# Patient Record
Sex: Female | Born: 1950 | Race: White | Hispanic: No | Smoking: Former smoker
Health system: Southern US, Community
[De-identification: ages and names within clinical notes are randomized; demographics above are authoritative.]

## PROBLEM LIST (undated history)

## (undated) DIAGNOSIS — I493 Ventricular premature depolarization: Secondary | ICD-10-CM

## (undated) DIAGNOSIS — R609 Edema, unspecified: Secondary | ICD-10-CM

## (undated) DIAGNOSIS — I059 Rheumatic mitral valve disease, unspecified: Secondary | ICD-10-CM

## (undated) DIAGNOSIS — I10 Essential (primary) hypertension: Secondary | ICD-10-CM

## (undated) DIAGNOSIS — R002 Palpitations: Secondary | ICD-10-CM

## (undated) DIAGNOSIS — M1990 Unspecified osteoarthritis, unspecified site: Secondary | ICD-10-CM

## (undated) DIAGNOSIS — R079 Chest pain, unspecified: Secondary | ICD-10-CM

## (undated) DIAGNOSIS — E079 Disorder of thyroid, unspecified: Secondary | ICD-10-CM

## (undated) DIAGNOSIS — I701 Atherosclerosis of renal artery: Secondary | ICD-10-CM

## (undated) DIAGNOSIS — I739 Peripheral vascular disease, unspecified: Secondary | ICD-10-CM

## (undated) HISTORY — DX: Rheumatic mitral valve disease, unspecified: I05.9

## (undated) HISTORY — DX: Peripheral vascular disease, unspecified: I73.9

## (undated) HISTORY — DX: Edema, unspecified: R60.9

## (undated) HISTORY — PX: EYE SURGERY: SHX253

## (undated) HISTORY — DX: Ventricular premature depolarization: I49.3

## (undated) HISTORY — DX: Essential (primary) hypertension: I10

## (undated) HISTORY — PX: INNER EAR SURGERY: SHX679

## (undated) HISTORY — DX: Unspecified osteoarthritis, unspecified site: M19.90

## (undated) HISTORY — DX: Palpitations: R00.2

## (undated) HISTORY — DX: Disorder of thyroid, unspecified: E07.9

## (undated) HISTORY — PX: TONSILLECTOMY: SUR1361

## (undated) HISTORY — DX: Chest pain, unspecified: R07.9

## (undated) HISTORY — PX: OTHER SURGICAL HISTORY: SHX169

## (undated) HISTORY — DX: Atherosclerosis of renal artery: I70.1

---

## 1998-10-20 ENCOUNTER — Other Ambulatory Visit: Admission: RE | Admit: 1998-10-20 | Discharge: 1998-10-20 | Payer: Self-pay | Admitting: Gynecology

## 1999-10-22 ENCOUNTER — Other Ambulatory Visit: Admission: RE | Admit: 1999-10-22 | Discharge: 1999-10-22 | Payer: Self-pay | Admitting: Gynecology

## 2000-10-25 ENCOUNTER — Other Ambulatory Visit: Admission: RE | Admit: 2000-10-25 | Discharge: 2000-10-25 | Payer: Self-pay | Admitting: Gynecology

## 2000-11-20 ENCOUNTER — Ambulatory Visit (HOSPITAL_COMMUNITY): Admission: RE | Admit: 2000-11-20 | Discharge: 2000-11-20 | Payer: Self-pay | Admitting: Orthopaedic Surgery

## 2000-11-20 ENCOUNTER — Encounter: Payer: Self-pay | Admitting: Orthopaedic Surgery

## 2001-10-26 ENCOUNTER — Other Ambulatory Visit: Admission: RE | Admit: 2001-10-26 | Discharge: 2001-10-26 | Payer: Self-pay | Admitting: Gynecology

## 2001-12-18 ENCOUNTER — Encounter: Payer: Self-pay | Admitting: Gynecology

## 2001-12-18 ENCOUNTER — Ambulatory Visit (HOSPITAL_COMMUNITY): Admission: RE | Admit: 2001-12-18 | Discharge: 2001-12-18 | Payer: Self-pay | Admitting: Gynecology

## 2001-12-21 ENCOUNTER — Ambulatory Visit (HOSPITAL_BASED_OUTPATIENT_CLINIC_OR_DEPARTMENT_OTHER): Admission: RE | Admit: 2001-12-21 | Discharge: 2001-12-21 | Payer: Self-pay | Admitting: Orthopedic Surgery

## 2002-01-09 ENCOUNTER — Encounter: Payer: Self-pay | Admitting: Gynecology

## 2002-01-09 ENCOUNTER — Ambulatory Visit (HOSPITAL_COMMUNITY): Admission: RE | Admit: 2002-01-09 | Discharge: 2002-01-09 | Payer: Self-pay | Admitting: Gynecology

## 2002-08-13 ENCOUNTER — Ambulatory Visit (HOSPITAL_COMMUNITY): Admission: RE | Admit: 2002-08-13 | Discharge: 2002-08-13 | Payer: Self-pay | Admitting: Gynecology

## 2002-08-13 ENCOUNTER — Encounter: Payer: Self-pay | Admitting: Gynecology

## 2002-11-08 ENCOUNTER — Other Ambulatory Visit: Admission: RE | Admit: 2002-11-08 | Discharge: 2002-11-08 | Payer: Self-pay | Admitting: Gynecology

## 2002-12-24 ENCOUNTER — Encounter: Payer: Self-pay | Admitting: Gynecology

## 2002-12-24 ENCOUNTER — Ambulatory Visit (HOSPITAL_COMMUNITY): Admission: RE | Admit: 2002-12-24 | Discharge: 2002-12-24 | Payer: Self-pay | Admitting: Gynecology

## 2003-05-14 ENCOUNTER — Inpatient Hospital Stay (HOSPITAL_COMMUNITY): Admission: EM | Admit: 2003-05-14 | Discharge: 2003-05-16 | Payer: Self-pay | Admitting: Emergency Medicine

## 2003-06-14 ENCOUNTER — Encounter (INDEPENDENT_AMBULATORY_CARE_PROVIDER_SITE_OTHER): Payer: Self-pay

## 2003-06-14 ENCOUNTER — Ambulatory Visit (HOSPITAL_COMMUNITY): Admission: RE | Admit: 2003-06-14 | Discharge: 2003-06-14 | Payer: Self-pay | Admitting: Gynecology

## 2003-08-23 ENCOUNTER — Ambulatory Visit (HOSPITAL_COMMUNITY): Admission: RE | Admit: 2003-08-23 | Discharge: 2003-08-23 | Payer: Self-pay | Admitting: Internal Medicine

## 2003-09-09 ENCOUNTER — Ambulatory Visit (HOSPITAL_COMMUNITY): Admission: RE | Admit: 2003-09-09 | Discharge: 2003-09-09 | Payer: Self-pay | Admitting: Internal Medicine

## 2003-12-16 ENCOUNTER — Other Ambulatory Visit: Admission: RE | Admit: 2003-12-16 | Discharge: 2003-12-16 | Payer: Self-pay | Admitting: Gynecology

## 2003-12-25 ENCOUNTER — Ambulatory Visit (HOSPITAL_COMMUNITY): Admission: RE | Admit: 2003-12-25 | Discharge: 2003-12-25 | Payer: Self-pay | Admitting: Gynecology

## 2004-08-06 ENCOUNTER — Ambulatory Visit (HOSPITAL_COMMUNITY): Admission: RE | Admit: 2004-08-06 | Discharge: 2004-08-06 | Payer: Self-pay | Admitting: Internal Medicine

## 2004-12-18 ENCOUNTER — Other Ambulatory Visit: Admission: RE | Admit: 2004-12-18 | Discharge: 2004-12-18 | Payer: Self-pay | Admitting: Gynecology

## 2004-12-22 ENCOUNTER — Ambulatory Visit (HOSPITAL_COMMUNITY): Admission: RE | Admit: 2004-12-22 | Discharge: 2004-12-22 | Payer: Self-pay | Admitting: Gynecology

## 2004-12-25 ENCOUNTER — Ambulatory Visit (HOSPITAL_COMMUNITY): Admission: RE | Admit: 2004-12-25 | Discharge: 2004-12-25 | Payer: Self-pay | Admitting: Gynecology

## 2005-12-27 ENCOUNTER — Ambulatory Visit (HOSPITAL_COMMUNITY): Admission: RE | Admit: 2005-12-27 | Discharge: 2005-12-27 | Payer: Self-pay | Admitting: Gynecology

## 2006-01-21 ENCOUNTER — Ambulatory Visit: Payer: Self-pay | Admitting: Internal Medicine

## 2006-01-21 ENCOUNTER — Ambulatory Visit (HOSPITAL_COMMUNITY): Admission: RE | Admit: 2006-01-21 | Discharge: 2006-01-21 | Payer: Self-pay | Admitting: Internal Medicine

## 2006-12-29 ENCOUNTER — Ambulatory Visit (HOSPITAL_COMMUNITY): Admission: RE | Admit: 2006-12-29 | Discharge: 2006-12-29 | Payer: Self-pay | Admitting: Gynecology

## 2007-01-20 ENCOUNTER — Ambulatory Visit (HOSPITAL_COMMUNITY): Admission: RE | Admit: 2007-01-20 | Discharge: 2007-01-20 | Payer: Self-pay | Admitting: *Deleted

## 2007-01-27 HISTORY — PX: CARDIAC CATHETERIZATION: SHX172

## 2007-06-23 ENCOUNTER — Ambulatory Visit (HOSPITAL_COMMUNITY): Admission: RE | Admit: 2007-06-23 | Discharge: 2007-06-23 | Payer: Self-pay | Admitting: Family Medicine

## 2007-06-30 ENCOUNTER — Ambulatory Visit (HOSPITAL_COMMUNITY): Admission: RE | Admit: 2007-06-30 | Discharge: 2007-06-30 | Payer: Self-pay | Admitting: Family Medicine

## 2008-01-02 ENCOUNTER — Ambulatory Visit (HOSPITAL_COMMUNITY): Admission: RE | Admit: 2008-01-02 | Discharge: 2008-01-02 | Payer: Self-pay | Admitting: Gynecology

## 2008-10-14 ENCOUNTER — Ambulatory Visit (HOSPITAL_COMMUNITY): Admission: RE | Admit: 2008-10-14 | Discharge: 2008-10-14 | Payer: Self-pay | Admitting: Internal Medicine

## 2008-10-28 ENCOUNTER — Emergency Department (HOSPITAL_COMMUNITY): Admission: EM | Admit: 2008-10-28 | Discharge: 2008-10-28 | Payer: Self-pay | Admitting: Emergency Medicine

## 2009-01-08 ENCOUNTER — Ambulatory Visit (HOSPITAL_COMMUNITY): Admission: RE | Admit: 2009-01-08 | Discharge: 2009-01-08 | Payer: Self-pay | Admitting: Gynecology

## 2009-01-26 DIAGNOSIS — I059 Rheumatic mitral valve disease, unspecified: Secondary | ICD-10-CM

## 2009-01-26 HISTORY — DX: Rheumatic mitral valve disease, unspecified: I05.9

## 2009-10-14 ENCOUNTER — Ambulatory Visit (HOSPITAL_COMMUNITY): Admission: RE | Admit: 2009-10-14 | Discharge: 2009-10-14 | Payer: Self-pay | Admitting: Family Medicine

## 2009-10-27 ENCOUNTER — Ambulatory Visit (HOSPITAL_COMMUNITY): Admission: RE | Admit: 2009-10-27 | Discharge: 2009-10-27 | Payer: Self-pay | Admitting: Family Medicine

## 2009-12-22 ENCOUNTER — Ambulatory Visit (HOSPITAL_COMMUNITY): Admission: RE | Admit: 2009-12-22 | Discharge: 2009-12-22 | Payer: Self-pay | Admitting: Cardiology

## 2009-12-26 DIAGNOSIS — I739 Peripheral vascular disease, unspecified: Secondary | ICD-10-CM

## 2009-12-26 HISTORY — DX: Peripheral vascular disease, unspecified: I73.9

## 2010-01-05 ENCOUNTER — Ambulatory Visit: Payer: Self-pay | Admitting: Vascular Surgery

## 2010-01-08 ENCOUNTER — Ambulatory Visit: Payer: Self-pay | Admitting: Vascular Surgery

## 2010-01-08 ENCOUNTER — Inpatient Hospital Stay (HOSPITAL_COMMUNITY): Admission: RE | Admit: 2010-01-08 | Discharge: 2010-01-09 | Payer: Self-pay | Admitting: Vascular Surgery

## 2010-01-08 ENCOUNTER — Encounter: Payer: Self-pay | Admitting: Vascular Surgery

## 2010-01-08 HISTORY — PX: CAROTID ENDARTERECTOMY: SUR193

## 2010-01-26 ENCOUNTER — Ambulatory Visit: Payer: Self-pay | Admitting: Vascular Surgery

## 2010-01-29 ENCOUNTER — Encounter: Payer: Self-pay | Admitting: Vascular Surgery

## 2010-02-09 ENCOUNTER — Ambulatory Visit: Payer: Self-pay | Admitting: Vascular Surgery

## 2010-05-18 ENCOUNTER — Ambulatory Visit (HOSPITAL_COMMUNITY): Admission: RE | Admit: 2010-05-18 | Discharge: 2010-05-18 | Payer: Self-pay | Admitting: Internal Medicine

## 2010-07-19 ENCOUNTER — Encounter: Payer: Self-pay | Admitting: Gynecology

## 2010-08-04 ENCOUNTER — Ambulatory Visit (INDEPENDENT_AMBULATORY_CARE_PROVIDER_SITE_OTHER): Payer: BC Managed Care – PPO | Admitting: Vascular Surgery

## 2010-08-04 ENCOUNTER — Other Ambulatory Visit: Payer: Self-pay

## 2010-08-04 DIAGNOSIS — Z48812 Encounter for surgical aftercare following surgery on the circulatory system: Secondary | ICD-10-CM

## 2010-08-04 DIAGNOSIS — I6529 Occlusion and stenosis of unspecified carotid artery: Secondary | ICD-10-CM

## 2010-08-07 NOTE — Assessment & Plan Note (Signed)
OFFICE VISIT  FREDIA, CHITTENDEN DOB:  03/06/1951                                       08/04/2010 EAVWU#:[id number]  The patient is a 60 year old female who returns today for follow-up regarding her right carotid endarterectomy which I performed July 14 of last year.  She was asymptomatic preoperatively and has had no neurologic symptoms including  hemiparesis, aphasia, amaurosis fugax, diplopia, blurred vision or syncope since her surgery.  He is taking one 81-mg aspirin tablet a day.  CHRONIC MEDICAL PROBLEMS: 1. Hypertension. 2. Hyperlipidemia. 3. Hypothyroidism. 4. Gout. 5. Negative for coronary artery disease, diabetes or stroke.  SOCIAL HISTORY:  She is married, has two children.  Works as a Mining engineer.  Does not use tobacco or alcohol.  REVIEW OF SYSTEMS:  Negative for chest pain, dyspnea on exertion.  Does have asthma on a chronic basis and arthritis and has no other specific symptoms in a complete review of systems.  PHYSICAL EXAMINATION:  Blood pressure 135/63, heart rate 81, respirations 18.  General:  She is a well-developed, well-nourished female who is in no apparent distress, alert and oriented x3.  HEENT: Exam normal for age.  EOMs intact.  Lungs:  Clear to auscultation.  No rhonchi or wheezing.  Cardiovascular:  Regular rhythm, no murmurs. Carotid pulses are 3+ and no bruits audible.  Right neck incision is healed nicely.  Abdomen:  Soft, nontender, with no masses. Musculoskeletal exam is free of major deformities.  Neurological: Normal.  Skin:  Free of rashes.  I ordered a carotid duplex exam today, which I have reviewed and interpreted.  She has no evidence of flow reduction in either internal carotid at the present time, right carotid endarterectomy site being widely patent.  I think she is doing quite well.  We will see her in 6 months with a follow-up carotid duplex exam at that time unless she develops any symptoms in  the interim.  If so, she will be in touch with [place] for earlier follow-up.    Quita Skye Hart [place], M.D. Electronically Signed  JDL/MEDQ  D:  08/04/2010  T:  08/05/2010  Job:  [id number]  cc:   Madelin Rear. Sherwood Gambler, MD

## 2010-08-10 NOTE — Procedures (Unsigned)
CAROTID DUPLEX EXAM  INDICATION:  Carotid artery disease  HISTORY: Diabetes:  no Cardiac:  no Hypertension:  yes Smoking:  no Previous Surgery:  Right carotid endarterectomy 01/08/2010 CV History:  The patient is currently asymptomatic Amaurosis Fugax No, Paresthesias No, Hemiparesis No                                      RIGHT             LEFT Brachial systolic pressure:         130               120 Brachial Doppler waveforms:         WNL               WNL Vertebral direction of flow:        Antegrade         Antegrade DUPLEX VELOCITIES (cm/sec) CCA peak systolic                   92                80 ECA peak systolic                   181               77 ICA peak systolic                   99                101 ICA end diastolic                   39                35 PLAQUE MORPHOLOGY:                                    Heterogenous PLAQUE AMOUNT:                                        Moderate PLAQUE LOCATION:                                      ICA / ECA / CCA  IMPRESSION:  Patent right carotid endarterectomy.  Bilaterally no hemodynamically significant stenosis within the internal carotid arteries.  Right external carotid artery stenosis.  This could be secondary to vessel tortuosity.  Bilateral tortuous internal carotid arteries.    ___________________________________________ Quita Skye Hart [place], M.D.  OD/MEDQ  D:  08/04/2010  T:  08/04/2010  Job:  [id number]

## 2010-09-12 LAB — CBC
HCT: 32 % — ABNORMAL LOW (ref 36.0–46.0)
Platelets: 163 10*3/uL (ref 150–400)
RDW: 15 % (ref 11.5–15.5)
WBC: 8.6 10*3/uL (ref 4.0–10.5)

## 2010-09-12 LAB — BASIC METABOLIC PANEL
BUN: 5 mg/dL — ABNORMAL LOW (ref 6–23)
CO2: 24 mEq/L (ref 19–32)
Calcium: 8.5 mg/dL (ref 8.4–10.5)
Chloride: 106 mEq/L (ref 96–112)
Creatinine, Ser: 1.11 mg/dL (ref 0.4–1.2)
GFR calc Af Amer: >60 mL/min (ref >60)
GFR calc non Af Amer: 50 mL/min — ABNORMAL LOW (ref >60)
Glucose, Bld: 125 mg/dL — ABNORMAL HIGH (ref 70–99)
Potassium: 3.7 mEq/L (ref 3.5–5.1)
Sodium: 136 mEq/L (ref 135–145)

## 2010-09-13 LAB — TYPE AND SCREEN: Antibody Screen: NEGATIVE

## 2010-09-13 LAB — URINALYSIS, ROUTINE W REFLEX MICROSCOPIC
Hgb urine dipstick: NEGATIVE
Protein, ur: NEGATIVE mg/dL
Urobilinogen, UA: 0.2 mg/dL (ref 0.0–1.0)

## 2010-09-13 LAB — COMPREHENSIVE METABOLIC PANEL
AST: 21 U/L (ref 0–37)
Albumin: 4 g/dL (ref 3.5–5.2)
Calcium: 9.5 mg/dL (ref 8.4–10.5)
Creatinine, Ser: 1.15 mg/dL (ref 0.4–1.2)
GFR calc Af Amer: 58 mL/min — ABNORMAL LOW (ref >60)
GFR calc non Af Amer: 48 mL/min — ABNORMAL LOW (ref >60)

## 2010-09-13 LAB — CBC
MCH: 31.2 pg (ref 26.0–34.0)
MCHC: 32.8 g/dL (ref 30.0–36.0)
Platelets: 210 10*3/uL (ref 150–400)

## 2010-09-13 LAB — APTT: aPTT: 25 seconds (ref 24–37)

## 2010-09-13 LAB — SURGICAL PCR SCREEN: Staphylococcus aureus: NEGATIVE

## 2010-10-06 LAB — CBC
HCT: 38.3 % (ref 36.0–46.0)
Hemoglobin: 12.9 g/dL (ref 12.0–15.0)
MCHC: 33.7 g/dL (ref 30.0–36.0)
MCV: 92.9 fL (ref 78.0–100.0)
RDW: 15 % (ref 11.5–15.5)

## 2010-10-06 LAB — DIFFERENTIAL
Basophils Absolute: 0.1 10*3/uL (ref 0.0–0.1)
Basophils Relative: 1 % (ref 0–1)
Eosinophils Absolute: 0.2 10*3/uL (ref 0.0–0.7)
Eosinophils Relative: 2 % (ref 0–5)
Monocytes Absolute: 0.4 10*3/uL (ref 0.1–1.0)
Monocytes Relative: 5 % (ref 3–12)

## 2010-10-06 LAB — BASIC METABOLIC PANEL
CO2: 26 mEq/L (ref 19–32)
Calcium: 9.5 mg/dL (ref 8.4–10.5)
Chloride: 103 mEq/L (ref 96–112)
Glucose, Bld: 131 mg/dL — ABNORMAL HIGH (ref 70–99)
Potassium: 3.9 mEq/L (ref 3.5–5.1)
Sodium: 136 mEq/L (ref 135–145)

## 2010-10-06 LAB — URINALYSIS, ROUTINE W REFLEX MICROSCOPIC
Bilirubin Urine: NEGATIVE
Glucose, UA: NEGATIVE mg/dL
Ketones, ur: NEGATIVE mg/dL
Protein, ur: NEGATIVE mg/dL

## 2010-10-28 ENCOUNTER — Other Ambulatory Visit (HOSPITAL_COMMUNITY): Payer: Self-pay | Admitting: Gynecology

## 2010-10-28 DIAGNOSIS — Z139 Encounter for screening, unspecified: Secondary | ICD-10-CM

## 2010-11-03 ENCOUNTER — Ambulatory Visit (HOSPITAL_COMMUNITY)
Admission: RE | Admit: 2010-11-03 | Discharge: 2010-11-03 | Disposition: A | Discharge status: Home / Self Care | Payer: BC Managed Care – PPO | Source: Ambulatory Visit | Attending: Gynecology | Admitting: Gynecology

## 2010-11-03 DIAGNOSIS — Z1231 Encounter for screening mammogram for malignant neoplasm of breast: Secondary | ICD-10-CM | POA: Insufficient documentation

## 2010-11-03 DIAGNOSIS — Z139 Encounter for screening, unspecified: Secondary | ICD-10-CM

## 2010-11-10 NOTE — H&P (Signed)
HISTORY AND PHYSICAL EXAMINATION   January 05, 2010   Re:  [place], [place] C               DOB:  [birth year]   CHIEF COMPLAINT:  Severe right internal carotid stenosis--asymptomatic.   HISTORY OF PRESENT ILLNESS:  This 60 year old female was referred for  evaluation of carotid arteries because of abnormal carotid duplex and CT  angiogram.  The patient was found to have a right carotid bruit and a  carotid duplex exam at [facility] on December 08, 2009,  was performed which I have reviewed.  This revealed a 75% to 80% right  internal carotid stenosis and a mild left internal carotid stenosis.  The patient denies any hemiparesis, aphasia, amaurosis fugax, diplopia,  or other stroke-like symptoms.  She did have an episode where her visual  field disappeared in both eyes recently, lasting only a few seconds and  then resolving.  She has no history of stroke.  CT angiogram was then  ordered and was performed on June 27 which I have reviewed.  This  reveals a severe stenosis of the origin of the right internal carotid  artery.   CHRONIC MEDICAL PROBLEMS:  1. Hypertension.  2. Hyperlipidemia.  3. Mild renal insufficiency apparently secondary to Naprosyn.  4. Negative for COPD, coronary artery disease, or diabetes.   SOCIAL HISTORY:  She is married, has 2 children, works as a Engineer, maintenance.  She smoked occasional tobacco (cigarettes)  over the last several years but quit 10 years ago.  Never smoked a  significant amount per day.  No alcohol use.   FAMILY HISTORY:  Negative for coronary artery disease, diabetes, or  stroke.   REVIEW OF SYSTEMS:  Does have occasional arthritis pain as well as her  chronic kidney disease.  Denies any chest pain, dyspnea on exertion,  PND, orthopnea.  No GI symptoms.  Denies any bleeding problems, weight  loss, anorexia.  All other systems and review of systems are negative.   PHYSICAL EXAM:  Blood  pressure 110/68, heart rate 70, respirations 14.  General: She is a healthy-appearing female in no apparent distress,  alert and oriented x3.  HEENT exam is normal.  EOMs intact.  Lungs are  clear to auscultation, no rhonchi or wheezing.  Cardiovascular exam  reveals a soft bruit on the right side; 3+ carotid pulses are palpable.  She has a regular rate and rhythm.  No murmurs are heard.  Abdomen:  Soft, nontender with no masses.  She has 3+ femoral, 2+ popliteal, 2+  dorsalis pedis pulses bilaterally.  Musculoskeletal exam is free of  major deformities.  Neurologic exam is normal.  Skin is free of rashes.   IMPRESSION:  1. 80% right internal carotid stenosis--asymptomatic.  2. Hypertension.  3. Hyperlipidemia.  4. Chronic renal insufficiency, mild.   PLAN:  Right carotid endarterectomy.  The risks have been thoroughly  discussed with the patient and her sister.  She would like to proceed.  This is scheduled for Thursday, July 14th.     Quita Skye Hart Rochester, M.D.  Electronically Signed   JDL/MEDQ  D:  01/05/2010  T:  01/06/2010  Job:  [id number]   cc:   Sheliah Mends, MD  Madelin Rear. Sherwood Gambler, MD  Dr. Renette Butters

## 2010-11-10 NOTE — H&P (Signed)
HISTORY AND PHYSICAL EXAMINATION   January 05, 2010   Re:  Spohr, Cathaleen C               DOB:  08/13/1950   CHIEF COMPLAINT:  Severe right internal carotid stenosis--asymptomatic.   HISTORY OF PRESENT ILLNESS:  This 60-year-old female was referred for  evaluation of carotid arteries because of abnormal carotid duplex and CT  angiogram.  The patient was found to have a right carotid bruit and a  carotid duplex exam at [facility] on December 08, 2009,  was performed which I have reviewed.  This revealed a 75% to 80% right  internal carotid stenosis and a mild left internal carotid stenosis.  The patient denies any hemiparesis, aphasia, amaurosis fugax, diplopia,  or other stroke-like symptoms.  She did have an episode where her visual  field disappeared in both eyes recently, lasting only a few seconds and  then resolving.  She has no history of stroke.  CT angiogram was then  ordered and was performed on June 27 which I have reviewed.  This  reveals a severe stenosis of the origin of the right internal carotid  artery.   CHRONIC MEDICAL PROBLEMS:  1. Hypertension.  2. Hyperlipidemia.  3. Mild renal insufficiency apparently secondary to Naprosyn.  4. Negative for COPD, coronary artery disease, or diabetes.   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  She smoked occasional tobacco (cigarettes)  over the last several years but quit 10 years ago.  Never smoked a  significant amount per day.  No alcohol use.   FAMILY HISTORY:  Negative for coronary artery disease, diabetes, or  stroke.   REVIEW OF SYSTEMS:  Does have occasional arthritis pain as well as her  chronic kidney disease.  Denies any chest pain, dyspnea on exertion,  PND, orthopnea.  No GI symptoms.  Denies any bleeding problems, weight  loss, anorexia.  All other systems and review of systems are negative.   PHYSICAL EXAM:  Blood  pressure 110/68, heart rate 70, respirations 14.  General: She is a healthy-appearing female in no apparent distress,  alert and oriented x3.  HEENT exam is normal.  EOMs intact.  Lungs are  clear to auscultation, no rhonchi or wheezing.  Cardiovascular exam  reveals a soft bruit on the right side; 3+ carotid pulses are palpable.  She has a regular rate and rhythm.  No murmurs are heard.  Abdomen:  Soft, nontender with no masses.  She has 3+ femoral, 2+ popliteal, 2+  dorsalis pedis pulses bilaterally.  Musculoskeletal exam is free of  major deformities.  Neurologic exam is normal.  Skin is free of rashes.   IMPRESSION:  1. 80% right internal carotid stenosis--asymptomatic.  2. Hypertension.  3. Hyperlipidemia.  4. Chronic renal insufficiency, mild.   PLAN:  Right carotid endarterectomy.  The risks have been thoroughly  discussed with the patient and her sister.  She would like to proceed.  This is scheduled for Thursday, July 14th.     James D. Lawson, M.D.  Electronically Signed   JDL/MEDQ  D:  01/05/2010  T:  01/06/2010  Job:  [id number]   cc:   Jens Eichhorn, MD  Lawrence J. Fusco, MD  Dr. Golden 

## 2010-11-10 NOTE — Assessment & Plan Note (Signed)
OFFICE VISIT   Schnepp, Jaelen C  DOB:  [birth year]                                       01/26/2010  JXBJY#:[id number]   Patient returns 3 weeks post right carotid endarterectomy for severe  asymptomatic right internal carotid stenosis.  She had minimal left  internal carotid occlusive disease.  She has done very well since her  surgery with no complaints of hoarseness or dysphasia and has had no  hemiparesis, aphasia, amaurosis fugax, diplopia, blurred vision, or  syncope.  She is swallowing well and has returned to her normal  activities.  She is taking an aspirin per day.   PHYSICAL EXAMINATION:  Blood pressure is 111/68, heart rate 70,  respirations 14.  Right neck incision is healed nicely.  She has 3+  carotid pulses.  No audible bruits.  Neurologic exam is normal.   I have reassured regarding these findings and encouraged her to resume  her activities as tolerated.  She will return to see [place] in 6 months for  a follow-up carotid duplex exam at that time unless she develops any  neurologic symptoms in the interim.     Quita Skye Hart Rochester, M.D.  Electronically Signed   JDL/MEDQ  D:  01/26/2010  T:  01/27/2010  Job:  [id number]

## 2010-11-13 NOTE — Op Note (Signed)
Jasmine Black, Jasmine Black                         ACCOUNT NO.:  [id number]   MEDICAL RECORD NO.:  [id number]                   PATIENT TYPE:  AMB   LOCATION:  SDC                                  FACILITY:  [facility]   PHYSICIAN:  Luvenia Redden, M.D.                DATE OF BIRTH:  [birth year]   DATE OF PROCEDURE:  06/14/2003  DATE OF DISCHARGE:                                 OPERATIVE REPORT   PREOPERATIVE DIAGNOSIS:  Abnormally thickened endometrial stripe on  ultrasound.   POSTOPERATIVE DIAGNOSIS:  Large endometrial polyp.   OPERATION:  Hysteroscopy and dilatation and curettage and polypectomy.   SURGEON:  Luvenia Redden, M.D.   DESCRIPTION OF PROCEDURE:  Under good sedation the patient was prepped and  draped in a sterile manner.  The cervix was grasped with a tenaculum and a  paracervical block was performed using 1% lidocaine, 10 mL on either side of  the cervix at the 4 and 8 o'clock positions approximately.  The uterus was  then sounded to a depth of 3-1/2 inches.  The cervix was dilated.  The scope  was inserted into the endocervical canal.  Using sorbitol as a distending  medium, the scope was advanced.  The endocervical canal appeared normal.  Immediately upon entering the uterine cavity, a large mass was encountered.  It was smooth on the surface, had several dilated vessels.  It was free in  the uterine cavity except on the anterior wall, where its pedicle seemed to  be attached.  The remainder of the endometrial surface appeared to be  actually kind of atrophic.  There were no other abnormalities found.  The  scope was retracted out of the uterus.  An endocervical curettage was done,  and this was sent as a separate specimen.  The endometrial cavity was then  explored with the polyp forceps and then the polyp was removed pretty much  in its entirety.  On examination it had a pearly pink color to it and was  smooth on its surface.  Continued exploration of the uterine cavity  failed  to obtain any further material.  The endometrial cavity was then scraped  thoroughly with a sharp curette, and minimal tissue was obtained.  The  endometrial cavity was then wiped and was clean.  The patient was given some  Toradol IV to prevent cramping postop.  There was minimal fluid deficit.  Blood loss was less than 20 mL.  The patient tolerated the procedure well  and was removed to recovery in good condition.                                               Luvenia Redden, M.D.    WSB/MEDQ  D:  06/14/2003  T:  06/14/2003  Job:  [id number]

## 2010-11-13 NOTE — Discharge Summary (Signed)
Jasmine Black, Jasmine Black                         ACCOUNT NO.:  [id number]   MEDICAL RECORD NO.:  [id number]                   PATIENT TYPE:  INP   LOCATION:  A203                                 FACILITY:  [facility]   PHYSICIAN:  Madelin Rear. Sherwood Gambler, M.D.             DATE OF BIRTH:  September 28, 1950   DATE OF ADMISSION:  05/14/2003  DATE OF DISCHARGE:  05/16/2003                                 DISCHARGE SUMMARY   DISCHARGE DIAGNOSES:  1. Pulmonary nodules, question significance.  2. Pelvic mass, question significance.   DISCHARGE MEDICATIONS:  1. HCTZ 12.5 mg p.o. daily.  2. Zyrtec 10 mg p.o. daily p.r.n.  3. Discontinuation of baseline Naprosyn usage and changed to Naprosyn 500 mg     p.o. b.i.d. p.r.n. gout.   DISCHARGE PENDING STUDIES:  1. CT of abdomen and pelvis.  2. Follow up CT of chest.   SUMMARY:  The patient had difficulty with shortness of breath, positional  vertigo and near syncopal episode.  She felt fatigued.  There was no other  localizing signs or symptoms.  She had no chest pain, hematemesis,  hematochezia or melena.  She had a notable past history of hypertension,  well managed as an outpatient.  She had nonspecific ST-T wave changes in her  EKG.  D. dimer was elevated.  This prompted a CT of the chest to exclude a  pulmonary embolus that is contributory to her shortness of breath and near  syncope, although, negative for embolisms.  Old pulmonary nodules were noted  of unknown significance.  During the scan of her legs for a DVT, an  incidental pelvic mass was noted to be followed by GYN as well as evaluation  with formal CT of the abdomen and pelvis.  She underwent CT of the head  which was negative.  Her cardiopulmonary status remained quite stable, and  she developed no other signs or symptoms and in fact improved  symptomatically.  A follow up pelvic ultrasound with a questionable adnexal  mass noted in the CT scan for DVT revealed thickening of the endometrial  stripe with no adnexal mass.  This was considered possibly an early  endometrial carcinoma, and therefore, GYN will be seen in follow up for  definitive evaluation and biopsy.   CONDITION ON DISCHARGE:  Stable.   FOLLOWUP:  She will follow up in the office as well as with GYN as described  above.     ___________________________________________                                         Madelin Rear. Sherwood Gambler, M.D.   LJF/MEDQ  D:  05/28/2003  T:  05/28/2003  Job:  [id number]

## 2010-11-13 NOTE — H&P (Signed)
[name], Jasmine Black                         ACCOUNT NO.:  [id number]   MEDICAL RECORD NO.:  [id number]                   PATIENT TYPE:  EMS   LOCATION:  ED                                   FACILITY:  [facility]   PHYSICIAN:  Madelin Rear. Sherwood Gambler, M.D.             DATE OF BIRTH:  12/13/1950   DATE OF ADMISSION:  05/14/2003  DATE OF DISCHARGE:                                HISTORY & PHYSICAL   CHIEF COMPLAINT:  Shortness of breath and near syncope.   HISTORY OF PRESENT ILLNESS:  The patient has had repeated bouts of somewhat  positional induced dizziness, poorly described and poorly characterized.  She does admit to some blacking out sensation, visual changes and  orthostatic change in posture.  She denies any hematemesis, hematochezia or  melena.  Possibly relevant is a recent change in her blood pressure  medication adding an ACE inhibitor to a baseline of HCTZ usage for better  control.  She admitted to a slight headache as well as requiring deep  breaths occasionally to get enough air.  Denies any hemoptysis, cough,  sputum, fever, rigors or chills.  There has been no vomiting, however,  positive nausea with a vaguely described vertiginous sensation.  She denied  any chest pain or palpitations.  No hematemesis, hematochezia, or melena.   PAST MEDICAL HISTORY:  Hypertension.   SOCIAL HISTORY:  Married, lives with husband, nonsmoker, nondrinker.   FAMILY HISTORY:  Noncontributory for this illness.   REVIEW OF SYSTEMS:  Under HPI all else negative.   PHYSICAL EXAMINATION:  GENERAL:  She appears somewhat anxious, moving slowly  when sitting up.  She has no evident nystagmus.  HEAD/NECK:  Showed no JVD or adenopathy.  Neck is supple.  CHEST:  Clear.  CARDIAC:     Regular rhythm without murmur, gallop or rub.  ABDOMEN:  Soft.  No organomegaly or masses.  EXTREMITIES:  Without clubbing, cyanosis or edema.  NEUROLOGIC:  Nonfocal.   LABORATORY STUDIES:  Obtained including hemoccult  stool which was negative.   EKG, in office as well as repeat in the emergency department, was remarkable  for nonspecific ST-T wave changes, questionably consistent with hypokalemia.  There was no acute ischemia or infarction noted.   A D-dimmer was noted to be elevated.  This was followed by a CT of her chest  which showed no pulmonary embolus or DVT.  Incidentally noted were scattered  tiny nodular densities with recommended repeat CT per radiologist.  Incidentally noted was a cystic adnexal mass and an ultrasound was suggested  for followup.  CT of her head was obtained and results are pending for  review, will be reviewed when available.   White count was elevated at 8.2 but H&H was fine.  Electrolytes were within  normal limits.  Cardiac enzymes revealed a modest elevation of her troponin  at 0.05; however, CK and MB were negative.   IMPRESSION:  1. Possible vertigo and near syncope.  Admit telemetry monitoring, serial     cardiac enzymes.  2. Elevated troponin, as above.  Possible cardiology consultation in the     morning.  3. Hypertension, expect in observation.  Continue diuretic therapy with     supplemental potassium, monitor for changes.  4. Elevated white count, uncertain significance at present.  Monitor for     fever spike and check urinalysis.   DISPOSITION:  The patient is currently stable in the emergency department,  pending review of CT of her head.  Addendum to be dictated as needed.     ___________________________________________                                         Madelin Rear. Sherwood Gambler, M.D.   LJF/MEDQ  D:  05/14/2003  T:  05/14/2003  Job:  [id number]

## 2010-11-13 NOTE — Op Note (Signed)
NAMEVELVET, [name]               ACCOUNT NO.:  [id number]   MEDICAL RECORD NO.:  [id number]          PATIENT TYPE:  AMB   LOCATION:  DAY                           FACILITY:  [facility]   PHYSICIAN:  R. Roetta Sessions, M.D. DATE OF BIRTH:  1950-08-28   DATE OF PROCEDURE:  01/21/2006  DATE OF DISCHARGE:                                 OPERATIVE REPORT   PROCEDURE:  Screening colonoscopy.   INDICATIONS FOR PROCEDURE:  The patient is a 60 year old lady sent over at  the courtesy of Dr. Artis Delay for colorectal cancer screening.  She has no  lower gastrointestinal tract symptoms.  There is no family history of  colorectal neoplasia.  She has never had lower GI tract imaged and  colonoscopy is now being done.  This approach has been discussed with the  patient at length.  Potential risks, benefits and alternatives have been  reviewed.  Questions answered.  She is agreeable.  Please see documentation  in patient's medical record.   PROCEDURE NOTE:  O2 saturation, blood pressure, pulse and respirations were  monitored throughout the entire procedure.   CONSCIOUS SEDATION:  Versed 4 mg, IV Demerol 100 mg IV in divided doses.   INSTRUMENT:  Olympus video chip system.   FINDINGS:  Digital rectal exam revealed no abnormalities.   ENDOSCOPIC FINDINGS:  The prep was excellent.   Rectum:  Examination of the rectal mucosa on retroflexed view at the anal  verge revealed no abnormalities aside from internal hemorrhoids.   The colonic mucosa was surveyed from the rectosigmoid junction through the  left, transverse, right colon, appendiceal orifice, ileocecal valve and  cecum.  These structures were well seen and photographed for the record.  The Olympus video scope was withdrawn.  All previously mucosal surfaces were  again seen.  The patient had extensive left-sided diverticula.  The  remaining colonic mucosa appeared normal.  The patient tolerated the  procedure well.   ENDOSCOPIC  IMPRESSION:  1.  Internal hemorrhoids, otherwise normal rectum.  2.  Left-sided diverticulum, rest of colonic mucosa appeared normal.   RECOMMENDATIONS:  1.  Diverticulosis literature provided to [name].  2.  Recommend repeat screening colonoscopy in 10 years.      Jonathon Bellows, M.D.  Electronically Signed     RMR/MEDQ  D:  01/21/2006  T:  01/21/2006  Job:  [id number]

## 2010-11-13 NOTE — Op Note (Signed)
[place]. [facility]  Patient:    BLAIR, MESINA Visit Number: [id number] MRN: [id number]          Service Type: DSU Location: [facility] Attending Physician:  Cornell Barman Dictated by:   Lenard Galloway Chaney Malling, M.D. Proc. Date: 12/21/01 Admit Date:  12/21/2001                             Operative Report  PREOPERATIVE DIAGNOSIS:  Tear of medial meniscus, left knee.  POSTOPERATIVE DIAGNOSES: 1. Tear of posterior horn medial meniscus, left knee. 2. Grade 2 cartilage damage, medial femoral condyle, and grade 4 cartilage    damage in the lateral patellar facet, left knee.  PROCEDURES: 1. Arthroscopy. 2. Debridement of posterior horn, medial meniscus, left knee. 3. Chondroplasty of medial femoral condyle and chondroplasty of lateral    patellar facet.  SURGEON:  Lenard Galloway. Chaney Malling, M.D.  ANESTHESIA:  MAC.  PATHOLOGY:  With the arthroscope in the knee, a very careful examination of both compartments was undertaken.  The patellofemoral joint was visualized first.  The cartilage in the femoral notch was normal, but there is marked fraying of the articular cartilage about the lateral patellar facet.  There was an area of raw bone exposed.  The arthroscope was then passed into the medial compartment.  There was some grade 2 cartilage scuffing over the medial femoral condyle, and there was a tear of the posterior horn of the medial meniscus.  The cartilage over the medial tibial plateau was absolutely normal. The anterior cruciate ligament was normal, and the arthroscope was then passed into the lateral compartment.  The articular cartilage of the lateral femoral condyle, lateral tibial plateau, and the entire circumference of the lateral meniscus was normal.  DESCRIPTION OF PROCEDURE:  Patient placed on the operating table in the supine position with a pneumatic tourniquet about the left upper thigh.  The left leg was placed in the leg holder and  the entire left lower extremity was prepped with Duraprep and draped out in the usual manner.  Marcaine then placed in the knee and Xylocaine and epinephrine used to infiltrate the puncture wounds.  An infusion cannula was placed in the superior medial pouch and the knee distended with saline.  Anterior medial and anterior lateral portals were made and the findings as described.  Attention was first turned to the patella. There was a large area of significant cartilage damage about the lateral patellar facet, and this was debrided.  There was a small area of total loss of articular cartilage.  The arthroscope was then passed into the medial compartment.  There was a tear of the posterior horn of the medial meniscus as noted above.  Through both portals a series of baskets were inserted, and the posterior horn was debrided.  The intra-articular shaver was then reduced and all debris was removed.  The remainder was then smoothed and balanced with a nice transition to the mid-third of the medial meniscus.  Using the smooth chondroplastic shaver, the medial femoral condyle was also debrided.  No other significant pathology was seen.  The knee was then filled with Marcaine and a large bulky pressure dressing applied.  The patient returned to the recovery room in excellent condition.  Technically this procedure went extremely well.  FOLLOW-UP CARE:  To my office in a week. Dictated by:   Lenard Galloway Chaney Malling, M.D. Attending Physician:  Cornell Barman DD:  12/21/01  TD:  12/22/01 Job: [id number]

## 2011-02-02 ENCOUNTER — Other Ambulatory Visit: Payer: BC Managed Care – PPO

## 2011-02-04 ENCOUNTER — Other Ambulatory Visit: Payer: Self-pay | Admitting: Gynecology

## 2011-03-12 ENCOUNTER — Other Ambulatory Visit (INDEPENDENT_AMBULATORY_CARE_PROVIDER_SITE_OTHER): Payer: BC Managed Care – PPO

## 2011-03-12 DIAGNOSIS — I6529 Occlusion and stenosis of unspecified carotid artery: Secondary | ICD-10-CM

## 2011-03-12 DIAGNOSIS — Z48812 Encounter for surgical aftercare following surgery on the circulatory system: Secondary | ICD-10-CM

## 2011-03-17 ENCOUNTER — Encounter: Payer: Self-pay | Admitting: Vascular Surgery

## 2011-03-17 NOTE — Procedures (Unsigned)
CAROTID DUPLEX EXAM  INDICATION:  Carotid artery stenosis.  HISTORY: Diabetes:  No. Cardiac:  No. Hypertension:  Yes. Smoking:  No. Previous Surgery:  Right carotid endarterectomy on 01/08/2010. CV History: Amaurosis Fugax No, Paresthesias No, Hemiparesis No                                      RIGHT             LEFT Brachial systolic pressure:         122               118 Brachial Doppler waveforms:         Triphasic         Triphasic Vertebral direction of flow:        Antegrade         Antegrade DUPLEX VELOCITIES (cm/sec) CCA peak systolic                   86                79 ECA peak systolic                   170               76 ICA peak systolic                   85                51 ICA end diastolic                   30                19 PLAQUE MORPHOLOGY:                                    Complex PLAQUE AMOUNT:                                        Mild PLAQUE LOCATION:                                      Bifurcation, proximal ICA  IMPRESSION: 1. The right internal carotid artery is within normal limits status     post carotid endarterectomy. 2. 20%-39% left internal carotid artery stenosis. 3. Elevated velocities in the right external carotid artery may be     related to small caliber of the vessel.  ___________________________________________ Quita Skye. Hart Rochester, M.D.  CI/MEDQ  D:  03/12/2011  T:  03/12/2011  Job:  [id number]

## 2011-03-18 ENCOUNTER — Other Ambulatory Visit: Payer: Self-pay | Admitting: Vascular Surgery

## 2011-03-18 DIAGNOSIS — Z48812 Encounter for surgical aftercare following surgery on the circulatory system: Secondary | ICD-10-CM

## 2011-03-18 DIAGNOSIS — I6529 Occlusion and stenosis of unspecified carotid artery: Secondary | ICD-10-CM

## 2011-12-09 ENCOUNTER — Other Ambulatory Visit (HOSPITAL_COMMUNITY): Payer: Self-pay | Admitting: Gynecology

## 2011-12-09 DIAGNOSIS — Z139 Encounter for screening, unspecified: Secondary | ICD-10-CM

## 2011-12-13 ENCOUNTER — Ambulatory Visit (HOSPITAL_COMMUNITY): Payer: BC Managed Care – PPO

## 2011-12-16 ENCOUNTER — Ambulatory Visit (HOSPITAL_COMMUNITY)
Admission: RE | Admit: 2011-12-16 | Discharge: 2011-12-16 | Disposition: A | Discharge status: Home / Self Care | Payer: BC Managed Care – PPO | Source: Ambulatory Visit | Attending: Gynecology | Admitting: Gynecology

## 2011-12-16 DIAGNOSIS — Z139 Encounter for screening, unspecified: Secondary | ICD-10-CM

## 2011-12-16 DIAGNOSIS — Z1231 Encounter for screening mammogram for malignant neoplasm of breast: Secondary | ICD-10-CM | POA: Insufficient documentation

## 2012-01-07 DIAGNOSIS — I10 Essential (primary) hypertension: Secondary | ICD-10-CM

## 2012-01-07 HISTORY — DX: Essential (primary) hypertension: I10

## 2012-03-21 ENCOUNTER — Other Ambulatory Visit: Payer: BC Managed Care – PPO

## 2012-03-21 ENCOUNTER — Ambulatory Visit: Payer: BC Managed Care – PPO | Admitting: Neurosurgery

## 2012-04-06 ENCOUNTER — Encounter: Payer: Self-pay | Admitting: Neurosurgery

## 2012-04-07 ENCOUNTER — Ambulatory Visit: Payer: BC Managed Care – PPO | Admitting: Neurosurgery

## 2012-04-07 ENCOUNTER — Other Ambulatory Visit: Payer: BC Managed Care – PPO

## 2012-04-28 ENCOUNTER — Ambulatory Visit (INDEPENDENT_AMBULATORY_CARE_PROVIDER_SITE_OTHER): Payer: BC Managed Care – PPO | Admitting: Neurosurgery

## 2012-04-28 ENCOUNTER — Encounter: Payer: Self-pay | Admitting: Neurosurgery

## 2012-04-28 ENCOUNTER — Other Ambulatory Visit (INDEPENDENT_AMBULATORY_CARE_PROVIDER_SITE_OTHER): Payer: BC Managed Care – PPO | Admitting: *Deleted

## 2012-04-28 VITALS — BP 127/68 | HR 72 | Resp 16 | Ht 62.0 in | Wt 153.0 lb

## 2012-04-28 DIAGNOSIS — I6529 Occlusion and stenosis of unspecified carotid artery: Secondary | ICD-10-CM

## 2012-04-28 NOTE — Progress Notes (Signed)
[facility] Carotid Office Note  CC: Carotid surveillance Referring Physician: Hart Rochester  History of Present Illness: 61 year old female patient of Dr. Hart Rochester status post right CEA in 2011. The patient denies any signs or symptoms of CVA, TIA, amaurosis fugax or any neural deficit. The patient denies any new medical diagnoses or recent surgery.  Past Medical History  Diagnosis Date  . Arthritis     Osteoprosis    ROS: [x]  Positive   [ ]  Denies    General: [ ]  Weight loss, [ ]  Fever, [ ]  chills Neurologic: [ ]  Dizziness, [ ]  Blackouts, [ ]  Seizure [ ]  Stroke, [ ]  "Mini stroke", [ ]  Slurred speech, [ ]  Temporary blindness; [ ]  weakness in arms or legs, [ ]  Hoarseness Cardiac: [ ]  Chest pain/pressure, [ ]  Shortness of breath at rest [ ]  Shortness of breath with exertion, [ ]  Atrial fibrillation or irregular heartbeat Vascular: [ ]  Pain in legs with walking, [ ]  Pain in legs at rest, [ ]  Pain in legs at night,  [ ]  Non-healing ulcer, [ ]  Blood clot in vein/DVT,   Pulmonary: [ ]  Home oxygen, [ ]  Productive cough, [ ]  Coughing up blood, [ ]  Asthma,  [ ]  Wheezing Musculoskeletal:  [ ]  Arthritis, [ ]  Low back pain, [ ]  Joint pain Hematologic: [ ]  Easy Bruising, [ ]  Anemia; [ ]  Hepatitis Gastrointestinal: [ ]  Blood in stool, [ ]  Gastroesophageal Reflux/heartburn, [ ]  Trouble swallowing Urinary: [ ]  chronic Kidney disease, [ ]  on HD - [ ]  MWF or [ ]  TTHS, [ ]  Burning with urination, [ ]  Difficulty urinating Skin: [ ]  Rashes, [ ]  Wounds Psychological: [ ]  Anxiety, [ ]  Depression   Social History History  Substance Use Topics  . Smoking status: Never Smoker   . Smokeless tobacco: Never Used  . Alcohol Use: No    Family History Family History  Problem Relation Age of Onset  . Heart disease Mother     Congestive Heart Failure  . Heart attack Father     Allergies  Allergen Reactions  . Neosporin (Neomycin-Bacitracin Zn-Polymyx) Rash    Current  Outpatient Prescriptions  Medication Sig Dispense Refill  . ALPRAZolam (XANAX) 0.5 MG tablet as needed.       Marland Kitchen HYDROcodone-acetaminophen (VICODIN) 5-500 MG per tablet as needed.       . zolpidem (AMBIEN) 10 MG tablet at bedtime.       . sulfamethoxazole-trimethoprim (BACTRIM DS) 800-160 MG per tablet         Physical Examination  Filed Vitals:   04/28/12 1533  BP: 127/68  Pulse: 72  Resp:     Body mass index is 27.98 kg/(m^2).  General:  WDWN in NAD Gait: Normal HEENT: WNL Eyes: Pupils equal Pulmonary: normal non-labored breathing , without Rales, rhonchi,  wheezing Cardiac: RRR, without  Murmurs, rubs or gallops; Abdomen: soft, NT, no masses Skin: no rashes, ulcers noted  Vascular Exam Pulses: 3+ radial pulses bilaterally Carotid bruits: Carotid pulses to auscultation no bruits are heard Extremities without ischemic changes, no Gangrene , no cellulitis; no open wounds;  Musculoskeletal: no muscle wasting or atrophy   Neurologic: A&O X 3; Appropriate Affect ; SENSATION: normal; MOTOR FUNCTION:  moving all extremities equally. Speech is fluent/normal  Non-Invasive Vascular Imaging CAROTID DUPLEX 04/28/2012  Right ICA 0 - 19% stenosis Left ICA 20 - 39 % stenosis   ASSESSMENT/PLAN: Asymptomatic patient status post right CEA with no recurrent stenosis. The  patient will followup in one year with repeat carotid duplex. The patient's questions were encouraged and answered, she is in agreement with this plan.  Lauree Chandler ANP   Clinic MD: Imogene Burn

## 2012-06-16 ENCOUNTER — Other Ambulatory Visit: Payer: Self-pay | Admitting: *Deleted

## 2012-06-16 DIAGNOSIS — I6529 Occlusion and stenosis of unspecified carotid artery: Secondary | ICD-10-CM

## 2012-09-18 ENCOUNTER — Encounter: Payer: Self-pay | Admitting: *Deleted

## 2012-11-22 ENCOUNTER — Ambulatory Visit (INDEPENDENT_AMBULATORY_CARE_PROVIDER_SITE_OTHER): Payer: BC Managed Care – PPO | Admitting: Cardiovascular Disease

## 2012-11-22 ENCOUNTER — Encounter: Payer: Self-pay | Admitting: Cardiovascular Disease

## 2012-11-22 VITALS — BP 124/70 | HR 79 | Ht 62.0 in | Wt 156.0 lb

## 2012-11-22 DIAGNOSIS — R079 Chest pain, unspecified: Secondary | ICD-10-CM

## 2012-11-22 DIAGNOSIS — E782 Mixed hyperlipidemia: Secondary | ICD-10-CM

## 2012-11-22 DIAGNOSIS — E785 Hyperlipidemia, unspecified: Secondary | ICD-10-CM | POA: Insufficient documentation

## 2012-11-22 DIAGNOSIS — I10 Essential (primary) hypertension: Secondary | ICD-10-CM

## 2012-11-22 DIAGNOSIS — I6529 Occlusion and stenosis of unspecified carotid artery: Secondary | ICD-10-CM

## 2012-11-22 DIAGNOSIS — Z79899 Other long term (current) drug therapy: Secondary | ICD-10-CM

## 2012-11-22 DIAGNOSIS — Z8679 Personal history of other diseases of the circulatory system: Secondary | ICD-10-CM

## 2012-11-22 DIAGNOSIS — I6521 Occlusion and stenosis of right carotid artery: Secondary | ICD-10-CM

## 2012-11-22 DIAGNOSIS — E039 Hypothyroidism, unspecified: Secondary | ICD-10-CM

## 2012-11-22 NOTE — Assessment & Plan Note (Signed)
Good control. Reported mild renal artery stenosis was not observed by angiography in 2008 and is not confirmed by last renal duplex US in 2013 - suspect spurious results due to tortuous arteries.

## 2012-11-22 NOTE — Progress Notes (Signed)
Patient ID: Jasmine Black, female   DOB: Jan 01, 1951, 63 y.o.   MRN: [id number]   Reason for office visit Annual visit for PAD, hyperlipidemia, HTN  She has had a good year, without any new health challenges or CV complaints. Continues to work in [employer] and leg swelling has not bothered her much. Dr. Hart Rochester follows carotid disease, s/p R CEA.  Allergies  Allergen Reactions  . Neosporin (Neomycin-Bacitracin Zn-Polymyx) Rash    Current Outpatient Prescriptions  Medication Sig Dispense Refill  . allopurinol (ZYLOPRIM) 300 MG tablet Take 300 mg by mouth daily.      Marland Kitchen ALPRAZolam (XANAX) 0.5 MG tablet as needed.       Marland Kitchen aspirin 81 MG tablet Take 81 mg by mouth daily.      . calcium citrate-vitamin D (CITRACAL+D) 315-200 MG-UNIT per tablet Take 1 tablet by mouth daily.      Marland Kitchen HYDROcodone-acetaminophen (VICODIN) 5-500 MG per tablet as needed.       Marland Kitchen levothyroxine (SYNTHROID, LEVOTHROID) 50 MCG tablet Take 50 mcg by mouth daily.      Marland Kitchen lisinopril (PRINIVIL,ZESTRIL) 10 MG tablet Take 10 mg by mouth daily.      . metoprolol tartrate (LOPRESSOR) 25 MG tablet Take 25 mg by mouth. Take 12.5mg  (one half) tab once daily      . simvastatin (ZOCOR) 20 MG tablet Take 20 mg by mouth every evening.      . zolpidem (AMBIEN) 10 MG tablet at bedtime.       . sulfamethoxazole-trimethoprim (BACTRIM DS) 800-160 MG per tablet        No current facility-administered medications for this visit.    Past Medical History  Diagnosis Date  . Arthritis     Osteoprosis  . Renal artery stenosis   . PAD (peripheral artery disease) 12/2009    right carotid endarterectomy  . Edema   . 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 Palpitation   . Hypertension 01/07/2012    Mildly abnormal renal doppler  . Chest pain     Past Surgical History  Procedure Laterality Date  . Carotid endarterectomy  January 08, 2010    Right cea  . Torn cartlidge      Knee  . Eye surgery      Cataract  . [place]  disease      Hand  . Tonsillectomy    . Inner ear surgery    . Cardiac catheterization  01/27/07    Normal right heart pressues, normal pulmonary capillary wedge pressure, normal cardiac output and cardiac index, she was normotensive intra-arterially, she had an EF 50%-55% with out reginal wall motion abnormality, no evidence of renal artery or PAD    Family History  Problem Relation Age of Onset  . Heart disease Mother     Congestive Heart Failure  . Heart attack Father     History   Social History  . Marital Status: Widowed    Spouse Name: N/A    Number of Children: N/A  . Years of Education: N/A   Occupational History  . Not on file.   Social History Main Topics  . Smoking status: Former Smoker    Quit date: 09/19/2007  . Smokeless tobacco: Never Used  . Alcohol Use: No  . Drug Use: No  . Sexually Active: Not on file   Other Topics Concern  . Not on file   Social History Narrative  . No narrative on file  Review of systems: The patient specifically denies any chest pain at rest exertion, dyspnea at rest or with exertion, orthopnea, paroxysmal nocturnal dyspnea, syncope, palpitations, focal neurological deficits, intermittent claudication, lower extremity edema, unexplained weight gain, cough, hemoptysis or wheezing.   PHYSICAL EXAM BP 124/70  Pulse 79  Ht 5\' 2"  (1.575 m)  Wt 156 lb (70.761 kg)  BMI 28.53 kg/m2  General: Alert, oriented x3, no distress Head: no evidence of trauma, PERRL, EOMI, no exophtalmos or lid lag, no myxedema, no xanthelasma; normal ears, nose and oropharynx Neck: normal jugular venous pulsations and no hepatojugular reflux; brisk carotid pulses without delay and no carotid bruits. R CEA scar Chest: clear to auscultation, no signs of consolidation by percussion or palpation, normal fremitus, symmetrical and full respiratory excursions Cardiovascular: normal position and quality of the apical impulse, regular rhythm, normal first and  second heart sounds, no murmurs, rubs or gallops Abdomen: no tenderness or distention, no masses by palpation, no abnormal pulsatility or arterial bruits, normal bowel sounds, no hepatosplenomegaly Extremities: no clubbing, cyanosis or edema; 2+ radial, ulnar and brachial pulses bilaterally; 2+ right femoral, posterior tibial and dorsalis pedis pulses; 2+ left femoral, posterior tibial and dorsalis pedis pulses; no subclavian or femoral bruits Neurological: grossly nonfocal   EKG: NSR, normal  BMET    Component Value Date/Time   NA 136 01/09/2010 0430   K 3.7 01/09/2010 0430   CL 106 01/09/2010 0430   CO2 24 01/09/2010 0430   GLUCOSE 125* 01/09/2010 0430   BUN 5* 01/09/2010 0430   CREATININE 1.11 01/09/2010 0430   CALCIUM 8.5 01/09/2010 0430   GFRNONAA 50* 01/09/2010 0430   GFRAA  Value: >60        The eGFR has been calculated using the MDRD equation. This calculation has not been validated in all clinical situations. eGFR's persistently <60 mL/min signify possible Chronic Kidney Disease. 01/09/2010 0430     ASSESSMENT AND PLAN  Carotid stenosis Asymptomatic, followed by Dr. Hart Rochester. Risk factors addressed appropriately.  HTN (hypertension) Good control. Reported mild renal artery stenosis was not observed by angiography in 2008 and is not confirmed by last renal duplex US in 2013 - suspect spurious results due to tortuous arteries.  H/O cardiomyopathy Questionable diagnosis by echocardiogram in 2008, EF 35-45%; normal LVEF by more recent studies and normal diastolic function.  Hypothyroidism Time to reassess TSH (when we draw lipid profile).  Hyperlipidemia Excellent lipid profile last July, time to reassess.     Junious Silk, MD, [facility] [phone] office [phone] pager

## 2012-11-22 NOTE — Assessment & Plan Note (Signed)
Excellent lipid profile last July, time to reassess.

## 2012-11-22 NOTE — Assessment & Plan Note (Signed)
Questionable diagnosis by echocardiogram in 2008, EF 35-45%; normal LVEF by more recent studies and normal diastolic function.

## 2012-11-22 NOTE — Assessment & Plan Note (Signed)
Time to reassess TSH (when we draw lipid profile).

## 2012-11-22 NOTE — Assessment & Plan Note (Signed)
Asymptomatic, followed by Dr. Hart Rochester. Risk factors addressed appropriately.

## 2012-11-22 NOTE — Patient Instructions (Signed)
Follow up with Dr. Royann Shivers in 1 year. Please have blood work done while fasting, because your cholesterol will be checked at that time. Expect to hear about the results of the labwork within 7-10 days of having it done.

## 2012-11-29 ENCOUNTER — Ambulatory Visit: Payer: BC Managed Care – PPO | Admitting: Cardiovascular Disease

## 2012-12-12 ENCOUNTER — Other Ambulatory Visit (HOSPITAL_COMMUNITY): Payer: Self-pay | Admitting: Gynecology

## 2012-12-12 DIAGNOSIS — Z139 Encounter for screening, unspecified: Secondary | ICD-10-CM

## 2013-01-02 ENCOUNTER — Ambulatory Visit (HOSPITAL_COMMUNITY)
Admission: RE | Admit: 2013-01-02 | Discharge: 2013-01-02 | Disposition: A | Discharge status: Home / Self Care | Payer: BC Managed Care – PPO | Source: Ambulatory Visit | Attending: Gynecology | Admitting: Gynecology

## 2013-01-02 DIAGNOSIS — Z139 Encounter for screening, unspecified: Secondary | ICD-10-CM

## 2013-01-02 DIAGNOSIS — Z1231 Encounter for screening mammogram for malignant neoplasm of breast: Secondary | ICD-10-CM | POA: Insufficient documentation

## 2013-01-08 LAB — LIPID PANEL
Cholesterol: 136 mg/dL (ref 0–200)
HDL: 38 mg/dL — ABNORMAL LOW
LDL Cholesterol: 69 mg/dL (ref 0–99)
Total CHOL/HDL Ratio: 3.6 ratio
Triglycerides: 145 mg/dL
VLDL: 29 mg/dL (ref 0–40)

## 2013-01-08 LAB — COMPREHENSIVE METABOLIC PANEL WITH GFR
ALT: 11 U/L (ref 0–35)
AST: 17 U/L (ref 0–37)
Albumin: 4.2 g/dL (ref 3.5–5.2)
Alkaline Phosphatase: 56 U/L (ref 39–117)
BUN: 18 mg/dL (ref 6–23)
CO2: 23 meq/L (ref 19–32)
Calcium: 9.6 mg/dL (ref 8.4–10.5)
Chloride: 108 meq/L (ref 96–112)
Creat: 1.27 mg/dL — ABNORMAL HIGH (ref 0.50–1.10)
Glucose, Bld: 90 mg/dL (ref 70–99)
Potassium: 4.6 meq/L (ref 3.5–5.3)
Sodium: 140 meq/L (ref 135–145)
Total Bilirubin: 1 mg/dL (ref 0.3–1.2)
Total Protein: 6.6 g/dL (ref 6.0–8.3)

## 2013-01-08 LAB — TSH: TSH: 20.517 u[IU]/mL — ABNORMAL HIGH (ref 0.350–4.500)

## 2013-04-13 ENCOUNTER — Other Ambulatory Visit (HOSPITAL_COMMUNITY): Payer: Self-pay | Admitting: Vascular Surgery

## 2013-04-13 ENCOUNTER — Other Ambulatory Visit: Payer: Self-pay | Admitting: Family

## 2013-04-13 DIAGNOSIS — I6529 Occlusion and stenosis of unspecified carotid artery: Secondary | ICD-10-CM

## 2013-05-01 ENCOUNTER — Other Ambulatory Visit (HOSPITAL_COMMUNITY): Payer: BC Managed Care – PPO

## 2013-05-01 ENCOUNTER — Ambulatory Visit: Payer: BC Managed Care – PPO | Admitting: Family

## 2013-05-18 ENCOUNTER — Ambulatory Visit: Payer: BC Managed Care – PPO | Admitting: Family

## 2013-05-18 ENCOUNTER — Other Ambulatory Visit (HOSPITAL_COMMUNITY): Payer: BC Managed Care – PPO

## 2013-06-25 ENCOUNTER — Other Ambulatory Visit: Payer: Self-pay | Admitting: *Deleted

## 2013-06-25 MED ORDER — LISINOPRIL 20 MG PO TABS: 10.0000 mg | ORAL_TABLET | Freq: Every day | ORAL | Status: DC

## 2013-06-25 NOTE — Telephone Encounter (Signed)
Rx was sent to pharmacy electronically. 

## 2013-07-31 ENCOUNTER — Other Ambulatory Visit: Payer: Self-pay | Admitting: Cardiovascular Disease

## 2013-08-06 ENCOUNTER — Other Ambulatory Visit: Payer: Self-pay | Admitting: *Deleted

## 2013-08-06 MED ORDER — SIMVASTATIN 20 MG PO TABS: 20.0000 mg | ORAL_TABLET | Freq: Every evening | ORAL | Status: DC

## 2013-08-10 ENCOUNTER — Telehealth (HOSPITAL_COMMUNITY): Payer: Self-pay | Admitting: *Deleted

## 2013-11-09 ENCOUNTER — Ambulatory Visit (INDEPENDENT_AMBULATORY_CARE_PROVIDER_SITE_OTHER): Payer: BC Managed Care – PPO | Admitting: Cardiovascular Disease

## 2013-11-09 ENCOUNTER — Encounter: Payer: Self-pay | Admitting: Cardiovascular Disease

## 2013-11-09 VITALS — BP 128/78 | HR 85 | Resp 16 | Ht 62.75 in | Wt 148.0 lb

## 2013-11-09 DIAGNOSIS — E785 Hyperlipidemia, unspecified: Secondary | ICD-10-CM

## 2013-11-09 DIAGNOSIS — I6529 Occlusion and stenosis of unspecified carotid artery: Secondary | ICD-10-CM

## 2013-11-09 DIAGNOSIS — R002 Palpitations: Secondary | ICD-10-CM

## 2013-11-09 DIAGNOSIS — E039 Hypothyroidism, unspecified: Secondary | ICD-10-CM

## 2013-11-09 DIAGNOSIS — Z79899 Other long term (current) drug therapy: Secondary | ICD-10-CM

## 2013-11-09 DIAGNOSIS — E782 Mixed hyperlipidemia: Secondary | ICD-10-CM

## 2013-11-09 DIAGNOSIS — I10 Essential (primary) hypertension: Secondary | ICD-10-CM

## 2013-11-09 DIAGNOSIS — Z9889 Other specified postprocedural states: Secondary | ICD-10-CM

## 2013-11-09 MED ORDER — METOPROLOL TARTRATE 25 MG PO TABS: 12.5000 mg | ORAL_TABLET | Freq: Two times a day (BID) | ORAL | Status: DC

## 2013-11-09 NOTE — Assessment & Plan Note (Signed)
Good control. We'll increase metoprolol to 12.5 mg twice a day for better palpitation management.

## 2013-11-09 NOTE — Assessment & Plan Note (Signed)
Good lipid profile one year ago the exception of borderline low HDL cholesterol. Weight loss is recommended.

## 2013-11-09 NOTE — Assessment & Plan Note (Signed)
Other than palpitations does not have any other symptoms digestive of hyperthyroidism.

## 2013-11-09 NOTE — Assessment & Plan Note (Signed)
Scheduled for carotid ultrasound in July.

## 2013-11-09 NOTE — Patient Instructions (Signed)
Dr Davy Piqueroitori has ordered the following test(s) to be done: LAB WORK - Lipid, CMP Your physician has requested that you have a carotid duplex to be done end of July. This test is an ultrasound of the carotid arteries in your neck. It looks at blood flow through these arteries that supply the brain with blood. Allow one hour for this exam. There are no restrictions or special instructions.  Your physician has recommended making the following medication changes: START taking Metoprolol 12.5 mg twice daily.  Dr Royann Shiversroitoru wants you to follow-up in 1 year. You will receive a reminder letter in the mail one months in advance. If you don't receive a letter, please call our office to schedule the follow-up appointment.

## 2013-11-09 NOTE — Progress Notes (Signed)
Patient ID: Jasmine Black, female   DOB: 1951/05/02, 63 y.o.   MRN: [id number]     Reason for office visit PAD, palpitations, HTN, hyperlipidemia  Jasmine Black is generally feeling well. She has infrequent palpitations, usually in the morning. She's been taking metoprolol tartrate just once daily. She was scheduled to have repeat carotid duplex ultrasonography, but was discouraged because of the high co-pay. She will receive Medicare benefits in July.  Jasmine Black has a history of possible cardiomyopathy with a reported ejection fraction of 35-45% in 2008, but with normal left ventricular systolic function on more recent studies. Pulse is evidence of normal diastolic function by Doppler ultrasonography. She underwent cardiac catheterization in 2008. She did not have evidence of significant coronary disease. She also had normal pressures on right heart catheterization.   In 2011 she underwent right carotid endarterectomy and had been followed with ultrasound in Dr. Candie Chroman office. She does not have a history of stroke or TIA.  History of hypertension hyperlipidemia and hypothyroidism. At one point she was suspected of having renal artery stenosis by ultrasonography but multiple subsequent assessments did not confirm the abnormality. Renal artery stenosis was not seen at the time of abdominal aortography in 2008.   Allergies  Allergen Reactions  . Neosporin [Neomycin-Bacitracin Zn-Polymyx] Rash    Current Outpatient Prescriptions  Medication Sig Dispense Refill  . allopurinol (ZYLOPRIM) 300 MG tablet Take 300 mg by mouth daily.      Marland Kitchen ALPRAZolam (XANAX) 0.5 MG tablet as needed.       Marland Kitchen aspirin 81 MG tablet Take 81 mg by mouth daily.      . calcium citrate-vitamin D (CITRACAL+D) 315-200 MG-UNIT per tablet Take 1 tablet by mouth daily.      Marland Kitchen HYDROcodone-acetaminophen (VICODIN) 5-500 MG per tablet as needed.       Marland Kitchen levothyroxine (SYNTHROID, LEVOTHROID) 88 MCG tablet Take 88 mcg by mouth  daily before breakfast.      . lisinopril (PRINIVIL,ZESTRIL) 20 MG tablet Take 0.5 tablets (10 mg total) by mouth daily.  45 tablet  1  . metoprolol tartrate (LOPRESSOR) 25 MG tablet Take 0.5 tablets (12.5 mg total) by mouth 2 (two) times daily.  30 tablet  11  . simvastatin (ZOCOR) 20 MG tablet Take 1 tablet (20 mg total) by mouth every evening.  30 tablet  4  . sulfamethoxazole-trimethoprim (BACTRIM DS) 800-160 MG per tablet       . zolpidem (AMBIEN) 10 MG tablet at bedtime.        No current facility-administered medications for this visit.    Past Medical History  Diagnosis Date  . Arthritis     Osteoprosis  . Renal artery stenosis   . PAD (peripheral artery disease) 12/2009    right carotid endarterectomy  . Edema   . 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 Palpitation   . Hypertension 01/07/2012    Mildly abnormal renal doppler  . Chest pain     Past Surgical History  Procedure Laterality Date  . Carotid endarterectomy  January 08, 2010    Right cea  . Torn cartlidge      Knee  . Eye surgery      Cataract  . Kienbock disease      Hand  . Tonsillectomy    . Inner ear surgery    . Cardiac catheterization  01/27/07    Normal right heart pressues, normal pulmonary capillary wedge pressure, normal cardiac output and  cardiac index, she was normotensive intra-arterially, she had an EF 50%-55% with out reginal wall motion abnormality, no evidence of renal artery or PAD    Family History  Problem Relation Age of Onset  . Heart disease Mother     Congestive Heart Failure  . Heart attack Father     History   Social History  . Marital Status: Widowed    Spouse Name: N/A    Number of Children: N/A  . Years of Education: N/A   Occupational History  . Not on file.   Social History Main Topics  . Smoking status: Former Smoker    Quit date: 09/19/2007  . Smokeless tobacco: Never Used  . Alcohol Use: No  . Drug Use: No  . Sexual Activity: Not on file    Other Topics Concern  . Not on file   Social History Narrative  . No narrative on file    Review of systems: The patient specifically denies any chest pain at rest or with exertion, dyspnea at rest or with exertion, orthopnea, paroxysmal nocturnal dyspnea, syncope, focal neurological deficits, intermittent claudication, lower extremity edema, unexplained weight gain, cough, hemoptysis or wheezing.  The patient also denies abdominal pain, nausea, vomiting, dysphagia, diarrhea, constipation, polyuria, polydipsia, dysuria, hematuria, frequency, urgency, abnormal bleeding or bruising, fever, chills, unexpected weight changes, mood swings, change in skin or hair texture, change in voice quality, auditory or visual problems, allergic reactions or rashes, new musculoskeletal complaints other than usual "aches and pains".   PHYSICAL EXAM BP 128/78  Pulse 85  Ht 5' 2.75" (1.594 m)  Wt 148 lb (67.132 kg)  BMI 26.42 kg/m2  General: Alert, oriented x3, no distress Head: no evidence of trauma, PERRL, EOMI, no exophtalmos or lid lag, no myxedema, no xanthelasma; normal ears, nose and oropharynx Neck: normal jugular venous pulsations and no hepatojugular reflux; brisk carotid pulses without delay and no carotid bruits. Healthy right carotid endarterectomy scar Chest: clear to auscultation, no signs of consolidation by percussion or palpation, normal fremitus, symmetrical and full respiratory excursions Cardiovascular: normal position and quality of the apical impulse, regular rhythm, normal first and second heart sounds, no murmurs, rubs or gallops Abdomen: no tenderness or distention, no masses by palpation, no abnormal pulsatility or arterial bruits, normal bowel sounds, no hepatosplenomegaly Extremities: no clubbing, cyanosis or edema; 2+ radial, ulnar and brachial pulses bilaterally; 2+ right femoral, posterior tibial and dorsalis pedis pulses; 2+ left femoral, posterior tibial and dorsalis pedis  pulses; no subclavian or femoral bruits Neurological: grossly nonfocal   EKG: Sinus rhythm, borderline PR interval at 110 ms, no repolarization abnormalities  Lipid Panel     Component Value Date/Time   CHOL 136 01/08/2013 1008   TRIG 145 01/08/2013 1008   HDL 38* 01/08/2013 1008   CHOLHDL 3.6 01/08/2013 1008   VLDL 29 01/08/2013 1008   LDLCALC 69 01/08/2013 1008    BMET    Component Value Date/Time   NA 140 01/08/2013 1008   K 4.6 01/08/2013 1008   CL 108 01/08/2013 1008   CO2 23 01/08/2013 1008   GLUCOSE 90 01/08/2013 1008   BUN 18 01/08/2013 1008   CREATININE 1.27* 01/08/2013 1008   CREATININE 1.11 01/09/2010 0430   CALCIUM 9.6 01/08/2013 1008   GFRNONAA 50* 01/09/2010 0430   GFRAA  Value: >60        The eGFR has been calculated using the MDRD equation. This calculation has not been validated in all clinical situations. eGFR's persistently <60 mL/min  signify possible Chronic Kidney Disease. 01/09/2010 0430     ASSESSMENT AND PLAN HTN (hypertension) Good control. We'll increase metoprolol to 12.5 mg twice a day for better palpitation management.  Hyperlipidemia Good lipid profile one year ago the exception of borderline low HDL cholesterol. Weight loss is recommended.  Hypothyroidism Other than palpitations does not have any other symptoms digestive of hyperthyroidism.  Carotid stenosis Scheduled for carotid ultrasound in July.   Orders Placed This Encounter  Procedures  . Lipid panel  . Comprehensive metabolic panel  . EKG 12-Lead  . Doppler carotid   Meds ordered this encounter  Medications  . levothyroxine (SYNTHROID, LEVOTHROID) 88 MCG tablet    Sig: Take 88 mcg by mouth daily before breakfast.  . metoprolol tartrate (LOPRESSOR) 25 MG tablet    Sig: Take 0.5 tablets (12.5 mg total) by mouth 2 (two) times daily.    Dispense:  30 tablet    Refill:  [address] Dewon Mendizabal  Sanda Klein, MD, [facility] [phone] office [phone] pager

## 2013-11-13 ENCOUNTER — Telehealth (HOSPITAL_COMMUNITY): Payer: Self-pay | Admitting: *Deleted

## 2013-12-12 ENCOUNTER — Other Ambulatory Visit (HOSPITAL_COMMUNITY): Payer: Self-pay | Admitting: Gynecology

## 2013-12-12 DIAGNOSIS — Z139 Encounter for screening, unspecified: Secondary | ICD-10-CM

## 2013-12-20 ENCOUNTER — Other Ambulatory Visit: Payer: Self-pay | Admitting: Cardiovascular Disease

## 2013-12-21 NOTE — Telephone Encounter (Signed)
Rx refill sent to patient pharmacy   

## 2014-01-01 LAB — LIPID PANEL
Cholesterol: 125 mg/dL (ref 0–200)
HDL: 49 mg/dL (ref >39)
LDL Cholesterol: 56 mg/dL (ref 0–99)
TRIGLYCERIDES: 101 mg/dL (ref <150)
Total CHOL/HDL Ratio: 2.6 Ratio
VLDL: 20 mg/dL (ref 0–40)

## 2014-01-01 LAB — COMPREHENSIVE METABOLIC PANEL
ALT: 13 U/L (ref 0–35)
AST: 17 U/L (ref 0–37)
Albumin: 3.9 g/dL (ref 3.5–5.2)
Alkaline Phosphatase: 66 U/L (ref 39–117)
BUN: 16 mg/dL (ref 6–23)
CALCIUM: 9.7 mg/dL (ref 8.4–10.5)
CHLORIDE: 103 meq/L (ref 96–112)
CO2: 24 meq/L (ref 19–32)
CREATININE: 1.51 mg/dL — AB (ref 0.50–1.10)
Glucose, Bld: 92 mg/dL (ref 70–99)
Potassium: 5.4 mEq/L — ABNORMAL HIGH (ref 3.5–5.3)
Sodium: 135 mEq/L (ref 135–145)
Total Bilirubin: 0.7 mg/dL (ref 0.2–1.2)
Total Protein: 7 g/dL (ref 6.0–8.3)

## 2014-01-03 NOTE — Progress Notes (Signed)
Lab results called to patient.  Voiced understanding. 

## 2014-01-04 ENCOUNTER — Ambulatory Visit (HOSPITAL_COMMUNITY)
Admission: RE | Admit: 2014-01-04 | Discharge: 2014-01-04 | Disposition: A | Discharge status: Home / Self Care | Payer: BC Managed Care – PPO | Source: Ambulatory Visit | Attending: Gynecology | Admitting: Gynecology

## 2014-01-04 DIAGNOSIS — Z1231 Encounter for screening mammogram for malignant neoplasm of breast: Secondary | ICD-10-CM | POA: Insufficient documentation

## 2014-01-04 DIAGNOSIS — Z139 Encounter for screening, unspecified: Secondary | ICD-10-CM

## 2014-01-11 ENCOUNTER — Ambulatory Visit (HOSPITAL_COMMUNITY)
Admission: RE | Admit: 2014-01-11 | Discharge: 2014-01-11 | Disposition: A | Discharge status: Home / Self Care | Payer: BC Managed Care – PPO | Source: Ambulatory Visit | Attending: Cardiovascular Disease | Admitting: Cardiovascular Disease

## 2014-01-11 DIAGNOSIS — Z9889 Other specified postprocedural states: Secondary | ICD-10-CM

## 2014-01-11 DIAGNOSIS — I6521 Occlusion and stenosis of right carotid artery: Secondary | ICD-10-CM

## 2014-01-11 DIAGNOSIS — I6529 Occlusion and stenosis of unspecified carotid artery: Secondary | ICD-10-CM

## 2014-01-11 NOTE — Progress Notes (Signed)
Carotid Duplex Completed. °Brianna L Mazza,RVT °

## 2014-01-15 ENCOUNTER — Telehealth: Payer: Self-pay | Admitting: *Deleted

## 2014-01-15 NOTE — Telephone Encounter (Signed)
LM with carotid doppler results.

## 2014-06-07 ENCOUNTER — Other Ambulatory Visit: Payer: Self-pay | Admitting: Cardiovascular Disease

## 2014-06-07 NOTE — Telephone Encounter (Signed)
Rx has been sent to the pharmacy electronically. ° °

## 2014-07-22 ENCOUNTER — Other Ambulatory Visit: Payer: Self-pay | Admitting: Cardiovascular Disease

## 2014-07-22 NOTE — Telephone Encounter (Signed)
Rx(s) sent to pharmacy electronically.  

## 2014-11-26 ENCOUNTER — Other Ambulatory Visit: Payer: Self-pay | Admitting: Cardiovascular Disease

## 2014-11-26 NOTE — Telephone Encounter (Signed)
Rx(s) sent to pharmacy electronically. OV 12/27/14

## 2014-12-09 ENCOUNTER — Other Ambulatory Visit (HOSPITAL_COMMUNITY): Payer: Self-pay | Admitting: Gynecology

## 2014-12-09 DIAGNOSIS — Z1231 Encounter for screening mammogram for malignant neoplasm of breast: Secondary | ICD-10-CM

## 2014-12-26 NOTE — Progress Notes (Signed)
Patient ID: Jasmine Black, female   DOB: [birth year], 64 y.o.   MRN: [id number]     Cardiology Office Note   Date:  12/27/2014   ID:  Jasmine Black, DOB November 27, 1950, MRN [id number]  PCP:  Cassell Smiles., MD  Cardiologist:   Thurmon Fair, MD   Chief Complaint  Patient presents with  . Annual Exam    no chest discomfort,no leg pAIN OR SWELLING OF LEGS      History of Present Illness: Jasmine Black is a 64 y.o. female who presents for PAD, palpitations, HTN, hyperlipidemia  Mrs. Hajduk has a history of possible cardiomyopathy with a reported ejection fraction of 35-45% in 2008, but with normal left ventricular systolic function on more recent studies. There is evidence of normal diastolic function by Doppler ultrasonography. She underwent cardiac catheterization in 2008. She did not have evidence of significant coronary disease. She also had normal pressures on right heart catheterization.   In 2011 she underwent right carotid endarterectomy and had been followed with ultrasound in Dr. Candie Chroman office (normal evaluation in July 2015). She does not have a history of stroke or TIA. She has hypertension, hyperlipidemia and hypothyroidism. At one point she was suspected of having renal artery stenosis by ultrasonography, but multiple subsequent assessments did not confirm the abnormality. Renal artery stenosis was not seen at the time of abdominal aortography in 2008.  She denies angina pectoris, lower showed edema, dyspnea either at rest or exertion , but continues to be troubled by palpitations which sometimes cause a split second sensation of near syncope.  Has not fallen or had full blown syncope ever.  Past Medical History  Diagnosis Date  . Arthritis     Osteoprosis  . Renal artery stenosis   . PAD (peripheral artery disease) 12/2009    right carotid endarterectomy  . Edema   . 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 Palpitation   . Hypertension 01/07/2012   Mildly abnormal renal doppler  . Chest pain     Past Surgical History  Procedure Laterality Date  . Carotid endarterectomy  January 08, 2010    Right cea  . Torn cartlidge      Knee  . Eye surgery      Cataract  . Kienbock disease      Hand  . Tonsillectomy    . Inner ear surgery    . Cardiac catheterization  01/27/07    Normal right heart pressues, normal pulmonary capillary wedge pressure, normal cardiac output and cardiac index, she was normotensive intra-arterially, she had an EF 50%-55% with out reginal wall motion abnormality, no evidence of renal artery or PAD     Current Outpatient Prescriptions  Medication Sig Dispense Refill  . allopurinol (ZYLOPRIM) 300 MG tablet Take 300 mg by mouth daily.    Marland Kitchen ALPRAZolam (XANAX) 0.5 MG tablet as needed.     Marland Kitchen aspirin 81 MG tablet Take 81 mg by mouth daily.    . calcium citrate-vitamin D (CITRACAL+D) 315-200 MG-UNIT per tablet Take 1 tablet by mouth daily.    Marland Kitchen levothyroxine (SYNTHROID, LEVOTHROID) 88 MCG tablet Take 75 mcg by mouth daily before breakfast.     . lisinopril (PRINIVIL,ZESTRIL) 20 MG tablet TAKE 1/2 TABLET BY MOUTH ONCE DAILY. 45 tablet 0  . zolpidem (AMBIEN) 10 MG tablet at bedtime.     . verapamil (VERELAN PM) 120 MG 24 hr capsule Take 1 capsule (120 mg total) by mouth at bedtime. 30  capsule 6   No current facility-administered medications for this visit.    Allergies:   Neosporin    Social History:  The patient  reports that she quit smoking about 7 years ago. She has never used smokeless tobacco. She reports that she does not drink alcohol or use illicit drugs.   Family History:  The patient's family history includes Heart attack in her father; Heart disease in her mother.    ROS:  Please see the history of present illness.    Otherwise, review of systems positive for none.   All other systems are reviewed and negative.    PHYSICAL EXAM: VS:  BP 126/60 mmHg  Pulse 74  Ht 5\' 2"  (1.575 m)  Wt 145 lb 4.8 oz  (65.908 kg)  BMI 26.57 kg/m2 , BMI Body mass index is 26.57 kg/(m^2).  General: Alert, oriented x3, no distress Head: no evidence of trauma, PERRL, EOMI, no exophtalmos or lid lag, no myxedema, no xanthelasma; normal ears, nose and oropharynx Neck: normal jugular venous pulsations and no hepatojugular reflux; brisk carotid pulses without delay and no carotid bruits Chest: clear to auscultation, no signs of consolidation by percussion or palpation, normal fremitus, symmetrical and full respiratory excursions Cardiovascular: normal position and quality of the apical impulse, regular rhythm with frequent ectopy, normal first and second heart sounds, no  murmurs, rubs or gallops Abdomen: no tenderness or distention, no masses by palpation, no abnormal pulsatility or arterial bruits, normal bowel sounds, no hepatosplenomegaly Extremities: no clubbing, cyanosis or edema; 2+ radial, ulnar and brachial pulses bilaterally; 2+ right femoral, posterior tibial and dorsalis pedis pulses; 2+ left femoral, posterior tibial and dorsalis pedis pulses; no subclavian or femoral bruits Neurological: grossly nonfocal Psych: euthymic mood, full affect   EKG:  EKG is ordered today. The ekg ordered today demonstrates  Sinus rhythm with frequent monomorphic, monophasic PVCs that have a left bundle Jackson block morphology, relatively narrow QRS and inferior axis. Precordial transition in lead V2, all consistent with RVOT origin.   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 Hold simvastatin while she takes verapamil. If it seems that we will decide on verapamil as long-term therapy will then switch to simvastatin to an alternative agent that does not have adverse drug interaction  HTN (hypertension) Well controlled  Hyperlipidemia Good lipid profile one year ago. Weight loss is recommended.  Hypothyroidism L-T4 dose decreased earlier this year  Carotid stenosis Scheduled for carotid ultrasound in July.    Current medicines are reviewed at length with the patient today.  The patient does not have concerns regarding medicines.  The following changes have been made:   Stop metoprolol and start verapamil sustained release 120 mg daily. Do not take simvastatin while you're taking verapamil. Plan to touch base again in one month to see if a verapamil is more use was long-term  therapy. If so we'll choose an alternative agent for cholesterol reduction.  Labs/ tests ordered today include:   Orders Placed This Encounter  Procedures  . TSH  . Lipid panel  . Comprehensive metabolic panel  . EKG 12-Lead   Patient Instructions  Medication Instructions:   STOP metoprolol  STOP simvastatin  START verapamil 120mg  once daily  Labwork:  TSH, lipid, CMET (fasting)  Testing/Procedures:  NONE  Follow-Up:  Call in 1 month and let us know how palpitations are doing  Follow up with Dr. Royann Shiversroitoru in 4-5 months  Any Other Special Instructions Will Be Listed Below (If Applicable).        Joie BimlerSigned, Kirstine Jacquin, MD  12/27/2014 1:19 PM    Thurmon FairMihai Lyrick Worland, MD, [facility] [phone] office [phone] pager

## 2014-12-27 ENCOUNTER — Encounter: Payer: Self-pay | Admitting: Cardiovascular Disease

## 2014-12-27 ENCOUNTER — Ambulatory Visit (INDEPENDENT_AMBULATORY_CARE_PROVIDER_SITE_OTHER): Payer: BC Managed Care – PPO | Admitting: Cardiovascular Disease

## 2014-12-27 VITALS — BP 126/60 | HR 74 | Ht 62.0 in | Wt 145.3 lb

## 2014-12-27 DIAGNOSIS — E039 Hypothyroidism, unspecified: Secondary | ICD-10-CM

## 2014-12-27 DIAGNOSIS — E785 Hyperlipidemia, unspecified: Secondary | ICD-10-CM

## 2014-12-27 DIAGNOSIS — I493 Ventricular premature depolarization: Secondary | ICD-10-CM

## 2014-12-27 DIAGNOSIS — R002 Palpitations: Secondary | ICD-10-CM | POA: Diagnosis not present

## 2014-12-27 DIAGNOSIS — I10 Essential (primary) hypertension: Secondary | ICD-10-CM

## 2014-12-27 DIAGNOSIS — Z79899 Other long term (current) drug therapy: Secondary | ICD-10-CM | POA: Diagnosis not present

## 2014-12-27 HISTORY — DX: Ventricular premature depolarization: I49.3

## 2014-12-27 MED ORDER — VERAPAMIL HCL ER 120 MG PO CP24: 120.0000 mg | ORAL_CAPSULE | Freq: Every day | ORAL | Status: DC

## 2014-12-27 NOTE — Patient Instructions (Signed)
Medication Instructions:   STOP metoprolol  STOP simvastatin  START verapamil 120mg  once daily  Labwork:  TSH, lipid, CMET (fasting)  Testing/Procedures:  NONE  Follow-Up:  Call in 1 month and let us know how palpitations are doing  Follow up with Dr. Royann Shiversroitoru in 4-5 months  Any Other Special Instructions Will Be Listed Below (If Applicable).

## 2015-01-03 LAB — LIPID PANEL
CHOLESTEROL: 163 mg/dL (ref 0–200)
HDL: 37 mg/dL — ABNORMAL LOW (ref >=46)
LDL Cholesterol: 92 mg/dL (ref 0–99)
TRIGLYCERIDES: 170 mg/dL — AB (ref <150)
Total CHOL/HDL Ratio: 4.4 Ratio
VLDL: 34 mg/dL (ref 0–40)

## 2015-01-03 LAB — COMPREHENSIVE METABOLIC PANEL
ALT: 12 U/L (ref 0–35)
AST: 15 U/L (ref 0–37)
Albumin: 4 g/dL (ref 3.5–5.2)
Alkaline Phosphatase: 56 U/L (ref 39–117)
BILIRUBIN TOTAL: 0.8 mg/dL (ref 0.2–1.2)
BUN: 20 mg/dL (ref 6–23)
CHLORIDE: 102 meq/L (ref 96–112)
CO2: 25 mEq/L (ref 19–32)
Calcium: 9.8 mg/dL (ref 8.4–10.5)
Creat: 1.07 mg/dL (ref 0.50–1.10)
Glucose, Bld: 92 mg/dL (ref 70–99)
Potassium: 4.5 mEq/L (ref 3.5–5.3)
SODIUM: 139 meq/L (ref 135–145)
TOTAL PROTEIN: 6.6 g/dL (ref 6.0–8.3)

## 2015-01-03 LAB — TSH: TSH: 0.202 u[IU]/mL — ABNORMAL LOW (ref 0.350–4.500)

## 2015-01-06 ENCOUNTER — Telehealth: Payer: Self-pay | Admitting: *Deleted

## 2015-01-06 DIAGNOSIS — E785 Hyperlipidemia, unspecified: Secondary | ICD-10-CM

## 2015-01-06 DIAGNOSIS — Z79899 Other long term (current) drug therapy: Secondary | ICD-10-CM

## 2015-01-06 MED ORDER — PRAVASTATIN SODIUM 40 MG PO TABS
40.0000 mg | ORAL_TABLET | Freq: Every evening | ORAL | Status: DC
Start: 2015-01-06 — End: 2015-10-06

## 2015-01-06 NOTE — Telephone Encounter (Signed)
Patient notified to start Pravastatin 40mg  qpm.  [facility]  Lab order mailed to patient to have fasting labs rechecked in 3-4 months.  Copy of labs faxed to pcp.

## 2015-01-06 NOTE — Telephone Encounter (Signed)
-----   Message from Thurmon FairMihai Croitoru, MD sent at 01/03/2015  5:23 PM EDT ----- That's right. Start pravastatin 40 mg daily in the evening. Recheck lipids in 3-4 months please

## 2015-01-08 ENCOUNTER — Other Ambulatory Visit (HOSPITAL_COMMUNITY): Payer: Self-pay | Admitting: Internal Medicine

## 2015-01-08 ENCOUNTER — Ambulatory Visit (HOSPITAL_COMMUNITY)
Admission: RE | Admit: 2015-01-08 | Discharge: 2015-01-08 | Disposition: A | Discharge status: Home / Self Care | Payer: BC Managed Care – PPO | Source: Ambulatory Visit | Attending: Gynecology | Admitting: Gynecology

## 2015-01-08 ENCOUNTER — Other Ambulatory Visit (HOSPITAL_COMMUNITY): Payer: Self-pay | Admitting: Gynecology

## 2015-01-08 DIAGNOSIS — Z1231 Encounter for screening mammogram for malignant neoplasm of breast: Secondary | ICD-10-CM | POA: Diagnosis present

## 2015-01-17 ENCOUNTER — Other Ambulatory Visit (HOSPITAL_COMMUNITY): Payer: Self-pay | Admitting: Physician Assistant

## 2015-01-17 DIAGNOSIS — M159 Polyosteoarthritis, unspecified: Secondary | ICD-10-CM

## 2015-04-21 ENCOUNTER — Other Ambulatory Visit: Payer: Self-pay | Admitting: Cardiovascular Disease

## 2015-04-21 ENCOUNTER — Telehealth: Payer: Self-pay | Admitting: Cardiovascular Disease

## 2015-04-21 DIAGNOSIS — R002 Palpitations: Secondary | ICD-10-CM

## 2015-04-21 MED ORDER — VERAPAMIL HCL ER 180 MG PO CP24: 180.0000 mg | ORAL_CAPSULE | Freq: Every day | ORAL | Status: DC

## 2015-04-21 NOTE — Telephone Encounter (Signed)
Appoint[facility] scheduled for 04/22/15 [facility] [address]

## 2015-04-21 NOTE — Telephone Encounter (Signed)
[name] is calling because she is having some palpitations which seems to be worse and BP is up . Just not feeling right . Please  Call at [phone]..     Thanks

## 2015-04-21 NOTE — Telephone Encounter (Signed)
[name] return the call ,..please call

## 2015-04-21 NOTE — Telephone Encounter (Signed)
Reports heart is palpitated more - mostly at night - has worsened so that she notices it during the day. This has been going on for a few weeks. She has stressors as her son has moved in and she is trying to raise her 178 y.o. Granddaughter - her brother in law recently and her manager's brother just died. Informed her that stress can have an impact on her symptoms - she states she has xanax. BP was 147/60s on Saturday - thinks has probably been elevated all along but she does not check regularly. Denies chest pain/shortness of breath. Head hurts and feels strange.   Informed patient I would seek advice from MD - bring in for EKG check or monitor?

## 2015-04-21 NOTE — Telephone Encounter (Signed)
Yes, please have her wear a 24-hour monitor. Also increase verapamil to 180 mg daily (sustained release)

## 2015-04-21 NOTE — Telephone Encounter (Signed)
REFILL 

## 2015-04-21 NOTE — Telephone Encounter (Signed)
LMTCB

## 2015-04-21 NOTE — Telephone Encounter (Signed)
Spoke with patient and informed her of MD recommendations. [facility] per patient request. 24 hour holter monitor ordered per MD.   Patient can come in this Thursday or Friday or any other day after 1-2pm [facility]. Message routed to scheduling pool to arrange.

## 2015-04-22 ENCOUNTER — Ambulatory Visit (INDEPENDENT_AMBULATORY_CARE_PROVIDER_SITE_OTHER): Payer: BC Managed Care – PPO

## 2015-04-22 DIAGNOSIS — R002 Palpitations: Secondary | ICD-10-CM

## 2015-04-25 ENCOUNTER — Telehealth: Payer: Self-pay | Admitting: Cardiovascular Disease

## 2015-04-25 NOTE — Telephone Encounter (Signed)
Returned call to patient she was calling to get results of monitor.Turned monitor back in 04/22/15.Advised results not available yet.

## 2015-04-25 NOTE — Telephone Encounter (Signed)
New message ° ° ° ° ° °Calling to get monitor results °

## 2015-04-28 ENCOUNTER — Telehealth: Payer: Self-pay | Admitting: *Deleted

## 2015-04-28 DIAGNOSIS — R002 Palpitations: Secondary | ICD-10-CM

## 2015-04-28 MED ORDER — VERAPAMIL HCL ER 240 MG PO CP24: 240.0000 mg | ORAL_CAPSULE | Freq: Every day | ORAL | Status: DC

## 2015-04-28 NOTE — Telephone Encounter (Signed)
Patient called and notified of results. Verapamil dose increased and ordered. [facility]   Message routed to scheduling pool to assist patient with arranging echo.

## 2015-04-28 NOTE — Telephone Encounter (Signed)
-----   Message from Thurmon FairMihai Croitoru, MD sent at 04/28/2015  3:26 PM EDT ----- Holter shows frequent PVCs, but no dangerous arrhythmia. Increased stress/anxiety commonly makes this worse. We can try to increase verapamil to 240 mg daily. If that does not help will need to return to beta blocker therapy (metoprolol). Let's also repeat her echo,please: not checked since 2010

## 2015-04-29 NOTE — Telephone Encounter (Signed)
Results called by Julaine FusiJenna Elkins, RN and advised of med change.

## 2015-05-07 LAB — COMPREHENSIVE METABOLIC PANEL
ALBUMIN: 3.7 g/dL (ref 3.6–5.1)
ALT: 12 U/L (ref 6–29)
AST: 15 U/L (ref 10–35)
Alkaline Phosphatase: 53 U/L (ref 33–130)
BUN: 18 mg/dL (ref 7–25)
CHLORIDE: 106 mmol/L (ref 98–110)
CO2: 24 mmol/L (ref 20–31)
Calcium: 9.7 mg/dL (ref 8.6–10.4)
Creat: 1.12 mg/dL — ABNORMAL HIGH (ref 0.50–0.99)
Glucose, Bld: 90 mg/dL (ref 65–99)
POTASSIUM: 5 mmol/L (ref 3.5–5.3)
Sodium: 139 mmol/L (ref 135–146)
Total Bilirubin: 0.7 mg/dL (ref 0.2–1.2)
Total Protein: 6.6 g/dL (ref 6.1–8.1)

## 2015-05-07 LAB — LIPID PANEL
Cholesterol: 118 mg/dL — ABNORMAL LOW (ref 125–200)
HDL: 41 mg/dL — AB (ref >=46)
LDL Cholesterol: 57 mg/dL (ref <130)
TRIGLYCERIDES: 99 mg/dL (ref <150)
Total CHOL/HDL Ratio: 2.9 Ratio (ref <=5.0)
VLDL: 20 mg/dL (ref <30)

## 2015-05-14 ENCOUNTER — Ambulatory Visit (HOSPITAL_COMMUNITY)
Admission: RE | Admit: 2015-05-14 | Discharge: 2015-05-14 | Disposition: A | Discharge status: Home / Self Care | Payer: BC Managed Care – PPO | Source: Ambulatory Visit | Attending: Cardiovascular Disease | Admitting: Cardiovascular Disease

## 2015-05-14 DIAGNOSIS — R002 Palpitations: Secondary | ICD-10-CM | POA: Insufficient documentation

## 2015-05-19 ENCOUNTER — Encounter: Payer: Self-pay | Admitting: Cardiovascular Disease

## 2015-05-19 ENCOUNTER — Ambulatory Visit (INDEPENDENT_AMBULATORY_CARE_PROVIDER_SITE_OTHER): Payer: BC Managed Care – PPO | Admitting: Cardiovascular Disease

## 2015-05-19 VITALS — BP 126/62 | HR 85 | Ht 62.0 in | Wt 143.0 lb

## 2015-05-19 DIAGNOSIS — I493 Ventricular premature depolarization: Secondary | ICD-10-CM | POA: Diagnosis not present

## 2015-05-19 DIAGNOSIS — E785 Hyperlipidemia, unspecified: Secondary | ICD-10-CM | POA: Diagnosis not present

## 2015-05-19 DIAGNOSIS — I10 Essential (primary) hypertension: Secondary | ICD-10-CM

## 2015-05-19 NOTE — Progress Notes (Signed)
Patient ID: Jasmine Black, female   DOB: 1951/04/20, 64 y.o.   MRN: [id number]     Cardiology Office Note   Date:  05/19/2015   ID:  Jasmine Black, DOB 1951/04/20, MRN [id number]  PCP:  Cassell SmilesFUSCO,LAWRENCE J., MD  Cardiologist:   Thurmon FairROITORU,Anandi Abramo, MD   Chief Complaint  Patient presents with  . 5 month follow up    no complaints      History of Present Illness: Jasmine Black is a 64 y.o. female who presents for  Symptomatic PVCs. These have improved after an increase in her dose of verapamil. As before her PVCs have a right ventricular outflow tract pattern. She has noticed some slowing in her bowel movements, not outright constipation. She has not had any problems with ankle swelling. A repeat echocardiogram was essentially normal study with just mild signs of left ventricular diastolic dysfunction. No structural or functional abnormalities of the right ventricle were reported.  Repeat lipid profile on pravastatin (switched from simvastatin due to risk of interaction with verapamil) shows excellent response.  Jasmine Black has a history of possible cardiomyopathy with a reported ejection fraction of 35-45% in 2008, but with normal left ventricular systolic function on more recent studies, including the most recent echocardiogram in 2016. There is evidence of normal diastolic function by Doppler ultrasonography. She underwent cardiac catheterization in 2008. She did not have evidence of significant coronary disease. She also had normal pressures on right heart catheterization.   In 2011 she underwent right carotid endarterectomy and had been followed with ultrasound in Dr. Candie ChromanLawson's office (normal evaluation in July 2015). She does not have a history of stroke or TIA. She has hypertension, hyperlipidemia and hypothyroidism. At one point she was suspected of having renal artery stenosis by ultrasonography, but multiple subsequent assessments did not confirm the abnormality. Renal artery stenosis was  not seen at the time of abdominal aortography in 2008.  Past Medical History  Diagnosis Date  . Arthritis     Osteoprosis  . Renal artery stenosis (HCC)   . PAD (peripheral artery disease) (HCC) 12/2009    right carotid endarterectomy  . Edema   . 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 Palpitation   . Hypertension 01/07/2012    Mildly abnormal renal doppler  . Chest pain   . 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 Carotid endarterectomy  January 08, 2010    Right cea  . Torn cartlidge      Knee  . Eye surgery      Cataract  . Kienbock disease      Hand  . Tonsillectomy    . Inner ear surgery    . Cardiac catheterization  01/27/07    Normal right heart pressues, normal pulmonary capillary wedge pressure, normal cardiac output and cardiac index, she was normotensive intra-arterially, she had an EF 50%-55% with out reginal wall motion abnormality, no evidence of renal artery or PAD     Current Outpatient Prescriptions  Medication Sig Dispense Refill  . allopurinol (ZYLOPRIM) 300 MG tablet Take 300 mg by mouth daily.    Marland Kitchen. ALPRAZolam (XANAX) 0.5 MG tablet as needed.     Marland Kitchen. aspirin 81 MG tablet Take 81 mg by mouth daily.    . calcium citrate-vitamin D (CITRACAL+D) 315-200 MG-UNIT per tablet Take 1 tablet by mouth daily.    Marland Kitchen. levothyroxine (SYNTHROID, LEVOTHROID) 75  MCG tablet Take 75 mcg by mouth daily.    Marland Kitchen lisinopril (PRINIVIL,ZESTRIL) 20 MG tablet TAKE 1/2 TABLET BY MOUTH ONCE DAILY. 45 tablet 0  . pravastatin (PRAVACHOL) 40 MG tablet Take 1 tablet (40 mg total) by mouth every evening. 90 tablet 3  . verapamil (VERELAN PM) 240 MG 24 hr capsule Take 1 capsule (240 mg total) by mouth at bedtime. 30 capsule 2  . zolpidem (AMBIEN) 10 MG tablet at bedtime.      No current facility-administered medications for this visit.    Allergies:   Neosporin    Social History:   The patient  reports that she quit smoking about 7 years ago. She has never used smokeless tobacco. She reports that she does not drink alcohol or use illicit drugs.   Family History:  The patient's family history includes Heart attack in her father; Heart disease in her mother.    ROS:  Please see the history of present illness.    Otherwise, review of systems positive for none.   All other systems are reviewed and negative.    PHYSICAL EXAM: VS:  BP 126/62 mmHg  Pulse 85  Ht  (1.575 m)  Wt 143 lb (64.864 kg)  BMI 26.15 kg/m2 , BMI Body mass index is 26.15 kg/(m^2).  General: Alert, oriented x3, no distress Head: no evidence of trauma, PERRL, EOMI, no exophtalmos or lid lag, no myxedema, no xanthelasma; normal ears, nose and oropharynx Neck: normal jugular venous pulsations and no hepatojugular reflux; brisk carotid pulses without delay and no carotid bruits Chest: clear to auscultation, no signs of consolidation by percussion or palpation, normal fremitus, symmetrical and full respiratory excursions Cardiovascular: normal position and quality of the apical impulse, regular rhythm, normal first and second heart sounds, no murmurs, rubs or gallops Abdomen: no tenderness or distention, no masses by palpation, no abnormal pulsatility or arterial bruits, normal bowel sounds, no hepatosplenomegaly Extremities: no clubbing, cyanosis or edema; 2+ radial, ulnar and brachial pulses bilaterally; 2+ right femoral, posterior tibial and dorsalis pedis pulses; 2+ left femoral, posterior tibial and dorsalis pedis pulses; no subclavian or femoral bruits Neurological: grossly nonfocal Psych: euthymic mood, full affect   EKG:  EKG is not ordered today.   Recent Labs: 01/02/2015: TSH 0.202* 05/06/2015: ALT 12; BUN 18; Creat 1.12*; Potassium 5.0; Sodium 139    Lipid Panel    Component Value Date/Time   CHOL 118* 05/06/2015 1024   TRIG 99 05/06/2015 1024   HDL 41* 05/06/2015 1024   CHOLHDL  2.9 05/06/2015 1024   VLDL 20 05/06/2015 1024   LDLCALC 57 05/06/2015 1024      Wt Readings from Last 3 Encounters:  05/19/15 143 lb (64.864 kg)  12/27/14 145 lb 4.8 oz (65.908 kg)  11/09/13 148 lb (67.132 kg)    .   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  Can take a stool softener or magnesium supplements if the constipation becomes a problem  HTN (hypertension) Well controlled  Hyperlipidemia  great lipid parameters on pravastatin. Weight loss is recommended.  Hypothyroidism L-T4 dose decreased earlier this year  Carotid stenosis s/p R CEA 2011 Followed at [facility]    Current medicines are reviewed at length with the patient today.  The patient does not have concerns regarding medicines.  The following changes have been made:  no change  Labs/ tests ordered today include:  No orders of the defined  types were placed in this encounter.     Patient Instructions  Dr. Royann Shivers recommends that you schedule a follow-up appointment in: one year        Signed, Thurmon Fair, MD  05/19/2015 4:57 PM    Thurmon Fair, MD, [facility] [phone] office [phone] pager

## 2015-05-19 NOTE — Patient Instructions (Signed)
Dr. Croitoru recommends that you schedule a follow-up appointment in: one year   

## 2015-05-29 ENCOUNTER — Ambulatory Visit: Payer: BC Managed Care – PPO | Admitting: Cardiovascular Disease

## 2015-07-14 ENCOUNTER — Other Ambulatory Visit: Payer: Self-pay | Admitting: Cardiovascular Disease

## 2015-07-14 NOTE — Telephone Encounter (Signed)
REFILL 

## 2015-07-28 ENCOUNTER — Other Ambulatory Visit: Payer: Self-pay | Admitting: Cardiovascular Disease

## 2015-10-06 ENCOUNTER — Other Ambulatory Visit: Payer: Self-pay | Admitting: Cardiovascular Disease

## 2015-10-06 NOTE — Telephone Encounter (Signed)
Rx request sent to pharmacy.  

## 2016-01-28 ENCOUNTER — Other Ambulatory Visit (HOSPITAL_COMMUNITY): Payer: Self-pay | Admitting: Internal Medicine

## 2016-01-28 DIAGNOSIS — Z1231 Encounter for screening mammogram for malignant neoplasm of breast: Secondary | ICD-10-CM

## 2016-02-02 ENCOUNTER — Ambulatory Visit (HOSPITAL_COMMUNITY)
Admission: RE | Admit: 2016-02-02 | Discharge: 2016-02-02 | Disposition: A | Discharge status: Home / Self Care | Payer: BC Managed Care – PPO | Source: Ambulatory Visit | Attending: Internal Medicine | Admitting: Internal Medicine

## 2016-02-02 DIAGNOSIS — Z1231 Encounter for screening mammogram for malignant neoplasm of breast: Secondary | ICD-10-CM | POA: Insufficient documentation

## 2016-04-09 ENCOUNTER — Other Ambulatory Visit: Payer: Self-pay | Admitting: Cardiovascular Disease

## 2016-04-22 ENCOUNTER — Encounter: Payer: Self-pay | Admitting: Cardiovascular Disease

## 2016-04-22 ENCOUNTER — Ambulatory Visit (INDEPENDENT_AMBULATORY_CARE_PROVIDER_SITE_OTHER): Payer: BC Managed Care – PPO | Admitting: Cardiovascular Disease

## 2016-04-22 VITALS — BP 126/58 | HR 66 | Ht 62.0 in | Wt 143.8 lb

## 2016-04-22 DIAGNOSIS — E790 Hyperuricemia without signs of inflammatory arthritis and tophaceous disease: Secondary | ICD-10-CM | POA: Diagnosis not present

## 2016-04-22 DIAGNOSIS — E038 Other specified hypothyroidism: Secondary | ICD-10-CM | POA: Diagnosis not present

## 2016-04-22 DIAGNOSIS — Z79899 Other long term (current) drug therapy: Secondary | ICD-10-CM

## 2016-04-22 DIAGNOSIS — I10 Essential (primary) hypertension: Secondary | ICD-10-CM

## 2016-04-22 DIAGNOSIS — I493 Ventricular premature depolarization: Secondary | ICD-10-CM | POA: Diagnosis not present

## 2016-04-22 DIAGNOSIS — I6521 Occlusion and stenosis of right carotid artery: Secondary | ICD-10-CM

## 2016-04-22 DIAGNOSIS — E785 Hyperlipidemia, unspecified: Secondary | ICD-10-CM

## 2016-04-22 NOTE — Patient Instructions (Signed)
Dr Royann Shiversroitoru recommends that you continue on your current medications as directed. Please refer to the Current Medication list given to you today.  Your physician recommends that you schedule a follow-up appointment in at your earliest convenience - FASTING.  Dr Royann Shiversroitoru recommends that you schedule a follow-up appointment in 1 year. You will receive a reminder letter in the mail two months in advance. If you don't receive a letter, please call our office to schedule the follow-up appointment.  If you need a refill on your cardiac medications before your next appointment, please call your pharmacy.

## 2016-04-22 NOTE — Progress Notes (Signed)
Cardiology Office Note    Date:  04/22/2016   ID:  Jasmine Black, DOB January 27, 1951, MRN [id number]  PCP:  Cassell SmilesFUSCO,LAWRENCE J., MD  Cardiologist:   Thurmon FairMihai Gatlyn Lipari, MD   Chief Complaint  Patient presents with  . Follow-up  . Edema    pt states some leg swelling in both     History of Present Illness:  Jasmine Black is a 65 y.o. 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 In the past she had cardiomyopathy with an ejection fraction of around 40% in 2008, subsequently normalized(question PVC related cardiomyopathy?). Her most recent echocardiogram in 2016 shows normal left ventricular systolic and diastolic function. She had right and left heart catheterization in 2008 that showed normal coronaries and normal filling pressures. She had right carotid endarterectomy in 2011 and is followed by ultrasound by Dr. Hart RochesterLawson. She does not have renal artery stenosis by her most recent ultrasound or by remote abdominal aortography.   Past Medical History:  Diagnosis Date  . Arthritis    Osteoprosis  . Chest pain   . Edema   . Hypertension 01/07/2012   Mildly abnormal renal doppler  . 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 PAD (peripheral artery disease) (HCC) 12/2009   right carotid endarterectomy  . Palpitation   . Renal artery stenosis (HCC)   . 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 CARDIAC CATHETERIZATION  01/27/07   Normal right heart pressues, normal pulmonary capillary wedge pressure, normal cardiac output and cardiac index, she was normotensive intra-arterially, she had an EF 50%-55% with out reginal wall motion abnormality, no evidence of renal artery or PAD  . CAROTID ENDARTERECTOMY  January 08, 2010   Right cea  . EYE SURGERY     Cataract  .  INNER EAR SURGERY    . Kienbock Disease     Hand  . TONSILLECTOMY    . torn cartlidge     Knee    Current Medications: Outpatient Medications Prior to Visit  Medication Sig Dispense Refill  . ALPRAZolam (XANAX) 0.5 MG tablet as needed.     Marland Kitchen. aspirin 81 MG tablet Take 81 mg by mouth every other day.     . calcium citrate-vitamin D (CITRACAL+D) 315-200 MG-UNIT per tablet Take 1 tablet by mouth daily.    Marland Kitchen. levothyroxine (SYNTHROID, LEVOTHROID) 75 MCG tablet Take 75 mcg by mouth daily.    Marland Kitchen. lisinopril (PRINIVIL,ZESTRIL) 20 MG tablet TAKE 1/2 TABLET BY MOUTH ONCE DAILY. 45 tablet 2  . pravastatin (PRAVACHOL) 40 MG tablet TAKE ONE TABLET BY MOUTH IN THE EVENING. 90 tablet 1  . verapamil (VERELAN PM) 240 MG 24 hr capsule TAKE (1) CAPSULE BY MOUTH AT BEDTIME. 30 capsule 9  . zolpidem (AMBIEN) 10 MG tablet at bedtime.     Marland Kitchen. allopurinol (ZYLOPRIM) 300 MG tablet Take 300 mg by mouth daily.     No facility-administered medications prior to visit.      Allergies:   Neosporin [neomycin-bacitracin zn-polymyx]   Social History   Social History  . Marital status: Widowed    Spouse name: N/A  . Number of children: N/A  . Years of education: N/A   Social History Main Topics  . Smoking status: Former Smoker    Quit date: 09/19/2007  . Smokeless tobacco: Never  Used  . Alcohol use No  . Drug use: No  . Sexual activity: Not Asked   Other Topics Concern  . None   Social History Narrative  . None     Family History:  The patient's family history includes Heart attack in her father; Heart disease in her mother.   ROS:   Please see the history of present illness.    ROS All other systems reviewed and are negative.   PHYSICAL EXAM:   VS:  BP (!) 126/58   Pulse 66   Ht 5\' 2"  (1.575 m)   Wt 143 lb 12.8 oz (65.2 kg)   BMI 26.30 kg/m    GEN: Well nourished, well developed, in no acute distress  HEENT: normal  Neck: no JVD, carotid bruits, or masses Cardiac: RRR; no murmurs, rubs, or  gallops,no edema  Respiratory:  clear to auscultation bilaterally, normal work of breathing GI: soft, nontender, nondistended, + BS MS: no deformity or atrophy  Skin: warm and dry, no rash Neuro:  Alert and Oriented x 3, Strength and sensation are intact Psych: euthymic mood, full affect  Wt Readings from Last 3 Encounters:  04/22/16 143 lb 12.8 oz (65.2 kg)  05/19/15 143 lb (64.9 kg)  12/27/14 145 lb 4.8 oz (65.9 kg)      Studies/Labs Reviewed:   EKG:  EKG is ordered today.  The ekg ordered today demonstrates Sinus rhythm with occasional PACs, otherwise normal  Recent Labs: 05/06/2015: ALT 12; BUN 18; Creat 1.12; Potassium 5.0; Sodium 139   Lipid Panel    Component Value Date/Time   CHOL 118 (L) 05/06/2015 1024   TRIG 99 05/06/2015 1024   HDL 41 (L) 05/06/2015 1024   CHOLHDL 2.9 05/06/2015 1024   VLDL 20 05/06/2015 1024   LDLCALC 57 05/06/2015 1024     ASSESSMENT:    1. 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 Stenosis of right carotid artery   3. Essential hypertension   4. Dyslipidemia   5. Other specified hypothyroidism   6. Hyperuricemia   7. Medication management      PLAN:  In order of problems listed above:  1. RVOT PVCs: Essentially asymptomatic while on verapamil. Constipation remains a minor problem. 2. Right carotid stenosis s/p endarterectomy: No neurological symptoms, risk factors well addressed. 3. HTN: Well controlled 4. HLP: On statin, time for repeat evaluation 5. Hypothyroidism: Recheck TSH 6. Hyperuricemia: Since she has not had a gout attack in a long time, she "weaned off" her medication. She would like to know where her uric acid rests and now.    Medication Adjustments/Labs and Tests Ordered: Current medicines are reviewed at length with the patient today.  Concerns regarding medicines are outlined above.  Medication changes, Labs and Tests ordered today are listed in the Patient Instructions  below. Patient Instructions  Dr Royann Shivers recommends that you continue on your current medications as directed. Please refer to the Current Medication list given to you today.  Your physician recommends that you schedule a follow-up appointment in at your earliest convenience - FASTING.  Dr Royann Shivers recommends that you schedule a follow-up appointment in 1 year. You will receive a reminder letter in the mail two months in advance. If you don't receive a letter, please call our office to schedule the follow-up appointment.  If you need a refill on your cardiac medications before your next appointment, please call your pharmacy.    Signed, Thurmon Fair, MD  04/22/2016 4:50  PM    [facility] [place], [place], [place]  [address] Phone: [phone]; Fax: [phone]

## 2016-04-26 ENCOUNTER — Other Ambulatory Visit: Payer: Self-pay | Admitting: Cardiovascular Disease

## 2016-05-07 LAB — CBC
HCT: 41.5 % (ref 35.0–45.0)
Hemoglobin: 13.2 g/dL (ref 11.7–15.5)
MCH: 29.1 pg (ref 27.0–33.0)
MCHC: 31.8 g/dL — ABNORMAL LOW (ref 32.0–36.0)
MCV: 91.6 fL (ref 80.0–100.0)
MPV: 9.7 fL (ref 7.5–12.5)
PLATELETS: 261 10*3/uL (ref 140–400)
RBC: 4.53 MIL/uL (ref 3.80–5.10)
RDW: 13 % (ref 11.0–15.0)
WBC: 6.5 10*3/uL (ref 3.8–10.8)

## 2016-05-08 LAB — COMPREHENSIVE METABOLIC PANEL
ALBUMIN: 4.1 g/dL (ref 3.6–5.1)
ALT: 12 U/L (ref 6–29)
AST: 15 U/L (ref 10–35)
Alkaline Phosphatase: 55 U/L (ref 33–130)
BILIRUBIN TOTAL: 0.6 mg/dL (ref 0.2–1.2)
BUN: 17 mg/dL (ref 7–25)
CO2: 22 mmol/L (ref 20–31)
CREATININE: 1.17 mg/dL — AB (ref 0.50–0.99)
Calcium: 9.6 mg/dL (ref 8.6–10.4)
Chloride: 108 mmol/L (ref 98–110)
Glucose, Bld: 88 mg/dL (ref 65–99)
Potassium: 5.3 mmol/L (ref 3.5–5.3)
SODIUM: 139 mmol/L (ref 135–146)
Total Protein: 6.7 g/dL (ref 6.1–8.1)

## 2016-05-08 LAB — LIPID PANEL
Cholesterol: 116 mg/dL (ref <200)
HDL: 47 mg/dL — AB (ref >50)
LDL Cholesterol: 51 mg/dL (ref <100)
Total CHOL/HDL Ratio: 2.5 Ratio (ref <5.0)
Triglycerides: 88 mg/dL (ref <150)
VLDL: 18 mg/dL (ref <30)

## 2016-05-08 LAB — TSH: TSH: 0.14 m[IU]/L — AB

## 2016-05-08 LAB — URIC ACID: URIC ACID, SERUM: 8.2 mg/dL — AB (ref 2.5–7.0)

## 2016-05-10 ENCOUNTER — Telehealth: Payer: Self-pay | Admitting: *Deleted

## 2016-05-10 NOTE — Telephone Encounter (Signed)
Spoke with pt, aware of results. Patient voiced understanding of medication recommendations. Will forward results to dr fusco.

## 2016-05-10 NOTE — Telephone Encounter (Addendum)
-----   Message from Thurmon FairMihai Croitoru, MD sent at 05/08/2016 12:20 PM EST ----- Most labs, including lipids are OK, but the TSH suggests excessive thyroid hormone. Uric acid slightly high, which can predispose to gout. If she has not had gout in a long time, probably does not need to restart medicine to lower uric acid, but needs to remain wary of things that can trigger gout such as excessive meat/shellfish intake, dehydration, diuretic use, etc. I think her thyroid dose needs to be reduced. I recommend that she adjust this with PCP, but for now I suggest reducing the medication to 6 days a week only and rechecking in several weeks.   Left message for pt to call to discuss

## 2016-06-25 ENCOUNTER — Telehealth: Payer: Self-pay | Admitting: Cardiovascular Disease

## 2016-06-25 DIAGNOSIS — Z79899 Other long term (current) drug therapy: Secondary | ICD-10-CM

## 2016-06-25 DIAGNOSIS — R7989 Other specified abnormal findings of blood chemistry: Secondary | ICD-10-CM

## 2016-06-25 NOTE — Telephone Encounter (Signed)
Spoke w Dorathy DaftKayla and informed her I have submitted TSH order for [facility]. Also provided her w my direct extension should she need to call back today.

## 2016-06-25 NOTE — Telephone Encounter (Signed)
Jasmine Black ( [employer]) is needing a order faxed over a thyroid lab . Please fax over to [phone].Marland Kitchen. Please call if you have any questions

## 2016-06-26 LAB — TSH: TSH: 10.82 u[IU]/mL — ABNORMAL HIGH (ref 0.450–4.500)

## 2016-07-05 ENCOUNTER — Other Ambulatory Visit: Payer: Self-pay | Admitting: Cardiovascular Disease

## 2016-10-27 ENCOUNTER — Other Ambulatory Visit (HOSPITAL_COMMUNITY): Payer: Self-pay | Admitting: Internal Medicine

## 2016-10-27 DIAGNOSIS — E2839 Other primary ovarian failure: Secondary | ICD-10-CM

## 2016-10-27 DIAGNOSIS — Z1231 Encounter for screening mammogram for malignant neoplasm of breast: Secondary | ICD-10-CM

## 2016-11-19 ENCOUNTER — Ambulatory Visit (HOSPITAL_COMMUNITY)
Admission: RE | Admit: 2016-11-19 | Discharge: 2016-11-19 | Disposition: A | Discharge status: Home / Self Care | Payer: Medicare Other | Source: Ambulatory Visit | Attending: Internal Medicine | Admitting: Internal Medicine

## 2016-11-19 DIAGNOSIS — M810 Age-related osteoporosis without current pathological fracture: Secondary | ICD-10-CM | POA: Diagnosis not present

## 2016-11-19 DIAGNOSIS — E2839 Other primary ovarian failure: Secondary | ICD-10-CM | POA: Diagnosis not present

## 2016-11-19 DIAGNOSIS — M85852 Other specified disorders of bone density and structure, left thigh: Secondary | ICD-10-CM | POA: Insufficient documentation

## 2017-01-03 ENCOUNTER — Other Ambulatory Visit: Payer: Self-pay | Admitting: Cardiovascular Disease

## 2017-03-28 ENCOUNTER — Ambulatory Visit (HOSPITAL_COMMUNITY)
Admission: RE | Admit: 2017-03-28 | Discharge: 2017-03-28 | Disposition: A | Discharge status: Home / Self Care | Payer: Medicare Other | Source: Ambulatory Visit | Attending: Internal Medicine | Admitting: Internal Medicine

## 2017-03-28 DIAGNOSIS — Z1231 Encounter for screening mammogram for malignant neoplasm of breast: Secondary | ICD-10-CM | POA: Diagnosis present

## 2017-04-26 ENCOUNTER — Other Ambulatory Visit: Payer: Self-pay | Admitting: Cardiovascular Disease

## 2017-04-26 NOTE — Telephone Encounter (Signed)
Rx(s) sent to pharmacy electronically.  

## 2017-05-05 ENCOUNTER — Ambulatory Visit: Payer: Medicare Other | Admitting: Cardiovascular Disease

## 2017-05-05 ENCOUNTER — Encounter: Payer: Self-pay | Admitting: Cardiovascular Disease

## 2017-05-05 VITALS — BP 122/60 | HR 69 | Ht 61.0 in | Wt 151.0 lb

## 2017-05-05 DIAGNOSIS — R5383 Other fatigue: Secondary | ICD-10-CM

## 2017-05-05 DIAGNOSIS — Z79899 Other long term (current) drug therapy: Secondary | ICD-10-CM | POA: Diagnosis not present

## 2017-05-05 DIAGNOSIS — I493 Ventricular premature depolarization: Secondary | ICD-10-CM | POA: Diagnosis not present

## 2017-05-05 DIAGNOSIS — E790 Hyperuricemia without signs of inflammatory arthritis and tophaceous disease: Secondary | ICD-10-CM | POA: Diagnosis not present

## 2017-05-05 DIAGNOSIS — E785 Hyperlipidemia, unspecified: Secondary | ICD-10-CM

## 2017-05-05 DIAGNOSIS — I10 Essential (primary) hypertension: Secondary | ICD-10-CM | POA: Diagnosis not present

## 2017-05-05 DIAGNOSIS — E039 Hypothyroidism, unspecified: Secondary | ICD-10-CM | POA: Diagnosis not present

## 2017-05-05 DIAGNOSIS — Z9889 Other specified postprocedural states: Secondary | ICD-10-CM | POA: Diagnosis not present

## 2017-05-05 NOTE — Progress Notes (Signed)
Cardiology Office Note    Date:  05/07/2017   ID:  Jasmine Black, DOB 09/12/1950, MRN [id number]  PCP:  Elfredia NevinsFusco, Lawrence, MD  Cardiologist:   Thurmon FairMihai Lawrance Wiedemann, MD   chief complaint: Follow-up cardiomyopathy, PVCs  History of Present Illness:  Jasmine Black is a 66 y.o. 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 In the past she had cardiomyopathy with an ejection fraction of around 40% in 2008, subsequently normalized(question PVC related cardiomyopathy?). Her most recent echocardiogram in 2016 shows normal left ventricular systolic and diastolic function. She had right and left heart catheterization in 2008 that showed normal coronaries and normal filling pressures. She had right carotid endarterectomy in 2011 and was followed by ultrasound by Dr. Hart RochesterLawson. She does not have renal artery stenosis by her most recent ultrasound or by remote abdominal aortography.  Since her last appointment she has not had any serious medical problems.  She stopped taking allopurinol but has not had any gout attacks.  She has not had problems with palpitations and she denies chest pain or dyspnea either at rest or with activity.  She takes care of most of her own housework without difficulty.  She denies dizziness, syncope, focal neurological complaints or intermittent claudication.   Past Medical History:  Diagnosis Date  . Arthritis    Osteoprosis  . Chest pain   . Edema   . Hypertension 01/07/2012   Mildly abnormal renal doppler  . 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 PAD (peripheral artery disease) (HCC) 12/2009   right carotid endarterectomy  . Palpitation   . Renal artery stenosis (HCC)   . 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 CARDIAC CATHETERIZATION   01/27/07   Normal right heart pressues, normal pulmonary capillary wedge pressure, normal cardiac output and cardiac index, she was normotensive intra-arterially, she had an EF 50%-55% with out reginal wall motion abnormality, no evidence of renal artery or PAD  . CAROTID ENDARTERECTOMY  January 08, 2010   Right cea  . EYE SURGERY     Cataract  . INNER EAR SURGERY    . Kienbock Disease     Hand  . TONSILLECTOMY    . torn cartlidge     Knee    Current Medications: Outpatient Medications Prior to Visit  Medication Sig Dispense Refill  . ALPRAZolam (XANAX) 0.5 MG tablet as needed.     Marland Kitchen. aspirin 81 MG tablet Take 81 mg by mouth every other day.     . calcium citrate-vitamin D (CITRACAL+D) 315-200 MG-UNIT per tablet Take 1 tablet by mouth daily.    Marland Kitchen. levothyroxine (SYNTHROID, LEVOTHROID) 75 MCG tablet Take 75 mcg by mouth daily.    Marland Kitchen. lisinopril (PRINIVIL,ZESTRIL) 20 MG tablet TAKE 1/2 TABLET BY MOUTH ONCE DAILY. 45 tablet 3  . pravastatin (PRAVACHOL) 40 MG tablet TAKE ONE TABLET BY MOUTH IN THE EVENING. 90 tablet 3  . verapamil (VERELAN PM) 240 MG 24 hr capsule TAKE (1) CAPSULE BY MOUTH AT BEDTIME. 30 capsule 0  . zolpidem (AMBIEN) 10 MG tablet at bedtime.      No facility-administered medications prior to visit.      Allergies:   Neosporin [neomycin-bacitracin zn-polymyx]   Social History   Socioeconomic History  . Marital status: Widowed    Spouse name: None  .  Number of children: None  . Years of education: None  . Highest education level: None  Social Needs  . Financial resource strain: None  . Food insecurity - worry: None  . Food insecurity - inability: None  . Transportation needs - medical: None  . Transportation needs - non-medical: None  Occupational History  . None  Tobacco Use  . Smoking status: Former Smoker    Last attempt to quit: 09/19/2007    Years since quitting: 9.6  . Smokeless tobacco: Never Used  Substance and Sexual Activity  . Alcohol use: No  .  Drug use: No  . Sexual activity: None  Other Topics Concern  . None  Social History Narrative  . None     Family History:  The patient's family history includes Heart attack in her father; Heart disease in her mother.   ROS:   Please see the history of present illness.    ROS All other systems reviewed and are negative.   PHYSICAL EXAM:   VS:  BP 122/60 (BP Location: Left Arm, Patient Position: Sitting, Cuff Size: Normal)   Pulse 69   Ht 5\' 1"  (1.549 m)   Wt 151 lb (68.5 kg)   BMI 28.53 kg/m     General: Alert, oriented x3, no distress, mildly overweight Head: no evidence of trauma, PERRL, EOMI, no exophtalmos or lid lag, no myxedema, no xanthelasma; normal ears, nose and oropharynx Neck: normal jugular venous pulsations and no hepatojugular reflux; brisk carotid pulses without delay and no carotid bruits Chest: clear to auscultation, no signs of consolidation by percussion or palpation, normal fremitus, symmetrical and full respiratory excursions Cardiovascular: normal position and quality of the apical impulse, regular rhythm, normal first and second heart sounds, no murmurs, rubs or gallops Abdomen: no tenderness or distention, no masses by palpation, no abnormal pulsatility or arterial bruits, normal bowel sounds, no hepatosplenomegaly Extremities: no clubbing, cyanosis or edema; 2+ radial, ulnar and brachial pulses bilaterally; 2+ right femoral, posterior tibial and dorsalis pedis pulses; 2+ left femoral, posterior tibial and dorsalis pedis pulses; no subclavian or femoral bruits Neurological: grossly nonfocal Psych: Normal mood and affect   Wt Readings from Last 3 Encounters:  05/05/17 151 lb (68.5 kg)  04/22/16 143 lb 12.8 oz (65.2 kg)  05/19/15 143 lb (64.9 kg)      Studies/Labs Reviewed:   EKG:  EKG is ordered today.  The ekg ordered today demonstrates normal sinus rhythm, nonspecific T wave flattening, QTC 435 ms Recent Labs: 05/06/2017: ALT 13; BUN 23;  Creatinine, Ser 1.26; Hemoglobin 13.1; Platelets 244; Potassium 5.0; Sodium 140; TSH 1.100   Lipid Panel    Component Value Date/Time   CHOL 130 05/06/2017 0822   TRIG 104 05/06/2017 0822   HDL 48 05/06/2017 0822   CHOLHDL 2.7 05/06/2017 0822   CHOLHDL 2.5 05/07/2016 1103   VLDL 18 05/07/2016 1103   LDLCALC 61 05/06/2017 0822     ASSESSMENT:    1. 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 History of right-sided carotid endarterectomy   3. Essential hypertension   4. Dyslipidemia   5. Acquired hypothyroidism   6. Hyperuricemia   7. Medication management   8. Other fatigue      PLAN:  In order of problems listed above:  1. RVOT PVCs: Excellent control on verapamil.  Does not report any significant side effects at this time.  Denies problems with constipation or ankle edema. 2. Right carotid stenosis s/p endarterectomy: No  neurological complaints.  As far as I can tell she has not had a duplex ultrasound in about 3 years.  Will forward her information to Dr. Candie ChromanLawson's office to see if they would like to continue following with ultrasound.  If not, we can do her follow-up in our office. 3. HTN: Good control 4. HLP: Excellent parameters last year, time to repeat her lipid profile 5. Hypothyroidism: TSH was elevated last year.  Recheck TSH.  Clinically euthyroid. 6. Hyperuricemia: Recheck uric acid today, no gout attacks recently.  Reminded her to stay well-hydrated and avoid meat, especially organ meats.    Medication Adjustments/Labs and Tests Ordered: Current medicines are reviewed at length with the patient today.  Concerns regarding medicines are outlined above.  Medication changes, Labs and Tests ordered today are listed in the Patient Instructions below. Patient Instructions  Dr Royann Shiversroitoru recommends that you continue on your current medications as directed. Please refer to the Current Medication list given to you today.  Your physician  recommends that you return for lab work at your earliest convenience - FASTING.  Dr Royann Shiversroitoru recommends that you schedule a follow-up appointment in 12 months. You will receive a reminder letter in the mail two months in advance. If you don't receive a letter, please call our office to schedule the follow-up appointment.  If you need a refill on your cardiac medications before your next appointment, please call your pharmacy.    Signed, Thurmon FairMihai Aliayah Tyer, MD  05/07/2017 10:09 AM    [facility] [address], [place], [place]  [address] Phone: [phone]; Fax: [phone]

## 2017-05-05 NOTE — Patient Instructions (Signed)
Dr Croitoru recommends that you continue on your current medications as directed. Please refer to the Current Medication list given to you today.  Your physician recommends that you return for lab work at your earliest convenience - FASTING.  Dr Croitoru recommends that you schedule a follow-up appointment in 12 months. You will receive a reminder letter in the mail two months in advance. If you don't receive a letter, please call our office to schedule the follow-up appointment.  If you need a refill on your cardiac medications before your next appointment, please call your pharmacy. 

## 2017-05-07 ENCOUNTER — Encounter: Payer: Self-pay | Admitting: Cardiovascular Disease

## 2017-05-07 LAB — COMPREHENSIVE METABOLIC PANEL
A/G RATIO: 1.7 (ref 1.2–2.2)
ALT: 13 IU/L (ref 0–32)
AST: 15 IU/L (ref 0–40)
Albumin: 4.4 g/dL (ref 3.6–4.8)
Alkaline Phosphatase: 48 IU/L (ref 39–117)
BILIRUBIN TOTAL: 0.7 mg/dL (ref 0.0–1.2)
BUN / CREAT RATIO: 18 (ref 12–28)
BUN: 23 mg/dL (ref 8–27)
CHLORIDE: 101 mmol/L (ref 96–106)
CO2: 25 mmol/L (ref 20–29)
Calcium: 9.9 mg/dL (ref 8.7–10.3)
Creatinine, Ser: 1.26 mg/dL — ABNORMAL HIGH (ref 0.57–1.00)
GFR, EST AFRICAN AMERICAN: 51 mL/min/{1.73_m2} — AB (ref >59)
GFR, EST NON AFRICAN AMERICAN: 44 mL/min/{1.73_m2} — AB (ref >59)
GLOBULIN, TOTAL: 2.6 g/dL (ref 1.5–4.5)
Glucose: 87 mg/dL (ref 65–99)
POTASSIUM: 5 mmol/L (ref 3.5–5.2)
Sodium: 140 mmol/L (ref 134–144)
TOTAL PROTEIN: 7 g/dL (ref 6.0–8.5)

## 2017-05-07 LAB — CBC
HEMATOCRIT: 41.1 % (ref 34.0–46.6)
HEMOGLOBIN: 13.1 g/dL (ref 11.1–15.9)
MCH: 29.7 pg (ref 26.6–33.0)
MCHC: 31.9 g/dL (ref 31.5–35.7)
MCV: 93 fL (ref 79–97)
Platelets: 244 10*3/uL (ref 150–379)
RBC: 4.41 x10E6/uL (ref 3.77–5.28)
RDW: 13.7 % (ref 12.3–15.4)
WBC: 6.7 10*3/uL (ref 3.4–10.8)

## 2017-05-07 LAB — LIPID PANEL
CHOL/HDL RATIO: 2.7 ratio (ref 0.0–4.4)
Cholesterol, Total: 130 mg/dL (ref 100–199)
HDL: 48 mg/dL (ref >39)
LDL Calculated: 61 mg/dL (ref 0–99)
Triglycerides: 104 mg/dL (ref 0–149)
VLDL Cholesterol Cal: 21 mg/dL (ref 5–40)

## 2017-05-07 LAB — URIC ACID: Uric Acid: 8.7 mg/dL — ABNORMAL HIGH (ref 2.5–7.1)

## 2017-05-07 LAB — TSH: TSH: 1.1 u[IU]/mL (ref 0.450–4.500)

## 2017-06-29 ENCOUNTER — Other Ambulatory Visit: Payer: Self-pay | Admitting: Cardiovascular Disease

## 2017-06-29 NOTE — Telephone Encounter (Signed)
Rx has been sent to the pharmacy electronically. ° °

## 2017-07-20 DIAGNOSIS — F419 Anxiety disorder, unspecified: Secondary | ICD-10-CM | POA: Diagnosis not present

## 2017-07-20 DIAGNOSIS — Z6825 Body mass index (BMI) 25.0-25.9, adult: Secondary | ICD-10-CM | POA: Diagnosis not present

## 2017-07-20 DIAGNOSIS — E663 Overweight: Secondary | ICD-10-CM | POA: Diagnosis not present

## 2017-07-20 DIAGNOSIS — G4709 Other insomnia: Secondary | ICD-10-CM | POA: Diagnosis not present

## 2017-07-20 DIAGNOSIS — Z1389 Encounter for screening for other disorder: Secondary | ICD-10-CM | POA: Diagnosis not present

## 2017-10-11 DIAGNOSIS — M810 Age-related osteoporosis without current pathological fracture: Secondary | ICD-10-CM | POA: Diagnosis not present

## 2017-10-11 DIAGNOSIS — E663 Overweight: Secondary | ICD-10-CM | POA: Diagnosis not present

## 2017-10-11 DIAGNOSIS — F419 Anxiety disorder, unspecified: Secondary | ICD-10-CM | POA: Diagnosis not present

## 2017-10-11 DIAGNOSIS — Z6825 Body mass index (BMI) 25.0-25.9, adult: Secondary | ICD-10-CM | POA: Diagnosis not present

## 2017-10-25 ENCOUNTER — Other Ambulatory Visit (HOSPITAL_COMMUNITY): Payer: Medicare Other

## 2017-10-25 ENCOUNTER — Ambulatory Visit (HOSPITAL_COMMUNITY): Payer: Medicare Other

## 2017-12-15 DIAGNOSIS — Z1389 Encounter for screening for other disorder: Secondary | ICD-10-CM | POA: Diagnosis not present

## 2017-12-15 DIAGNOSIS — E663 Overweight: Secondary | ICD-10-CM | POA: Diagnosis not present

## 2017-12-15 DIAGNOSIS — B029 Zoster without complications: Secondary | ICD-10-CM | POA: Diagnosis not present

## 2017-12-15 DIAGNOSIS — Z6826 Body mass index (BMI) 26.0-26.9, adult: Secondary | ICD-10-CM | POA: Diagnosis not present

## 2017-12-19 ENCOUNTER — Other Ambulatory Visit: Payer: Self-pay | Admitting: Cardiovascular Disease

## 2017-12-19 NOTE — Telephone Encounter (Signed)
Rx request sent to pharmacy.  

## 2017-12-28 ENCOUNTER — Other Ambulatory Visit: Payer: Self-pay | Admitting: Cardiovascular Disease

## 2018-02-20 ENCOUNTER — Other Ambulatory Visit (HOSPITAL_COMMUNITY): Payer: Self-pay | Admitting: Physician Assistant

## 2018-02-20 DIAGNOSIS — Z1231 Encounter for screening mammogram for malignant neoplasm of breast: Secondary | ICD-10-CM

## 2018-02-21 DIAGNOSIS — Z6826 Body mass index (BMI) 26.0-26.9, adult: Secondary | ICD-10-CM | POA: Diagnosis not present

## 2018-02-21 DIAGNOSIS — Z0001 Encounter for general adult medical examination with abnormal findings: Secondary | ICD-10-CM | POA: Diagnosis not present

## 2018-02-21 DIAGNOSIS — E663 Overweight: Secondary | ICD-10-CM | POA: Diagnosis not present

## 2018-02-22 DIAGNOSIS — E782 Mixed hyperlipidemia: Secondary | ICD-10-CM | POA: Diagnosis not present

## 2018-02-22 DIAGNOSIS — Z0001 Encounter for general adult medical examination with abnormal findings: Secondary | ICD-10-CM | POA: Diagnosis not present

## 2018-02-22 DIAGNOSIS — Z6826 Body mass index (BMI) 26.0-26.9, adult: Secondary | ICD-10-CM | POA: Diagnosis not present

## 2018-02-22 DIAGNOSIS — Z0000 Encounter for general adult medical examination without abnormal findings: Secondary | ICD-10-CM | POA: Diagnosis not present

## 2018-02-22 DIAGNOSIS — E063 Autoimmune thyroiditis: Secondary | ICD-10-CM | POA: Diagnosis not present

## 2018-02-27 ENCOUNTER — Other Ambulatory Visit: Payer: Self-pay | Admitting: Cardiovascular Disease

## 2018-03-27 ENCOUNTER — Other Ambulatory Visit: Payer: Self-pay

## 2018-03-27 ENCOUNTER — Ambulatory Visit (INDEPENDENT_AMBULATORY_CARE_PROVIDER_SITE_OTHER): Payer: PPO | Admitting: Adult Health

## 2018-03-27 ENCOUNTER — Encounter: Payer: Self-pay | Admitting: Adult Health

## 2018-03-27 ENCOUNTER — Other Ambulatory Visit (HOSPITAL_COMMUNITY)
Admission: RE | Admit: 2018-03-27 | Discharge: 2018-03-27 | Disposition: A | Discharge status: Home / Self Care | Payer: PPO | Source: Ambulatory Visit | Attending: Adult Health | Admitting: Adult Health

## 2018-03-27 VITALS — BP 124/67 | HR 81 | Resp 18 | Ht 61.0 in | Wt 151.0 lb

## 2018-03-27 DIAGNOSIS — Z1151 Encounter for screening for human papillomavirus (HPV): Secondary | ICD-10-CM | POA: Insufficient documentation

## 2018-03-27 DIAGNOSIS — Z01419 Encounter for gynecological examination (general) (routine) without abnormal findings: Secondary | ICD-10-CM | POA: Diagnosis not present

## 2018-03-27 DIAGNOSIS — Z124 Encounter for screening for malignant neoplasm of cervix: Secondary | ICD-10-CM | POA: Diagnosis not present

## 2018-03-27 DIAGNOSIS — Z1212 Encounter for screening for malignant neoplasm of rectum: Secondary | ICD-10-CM | POA: Diagnosis not present

## 2018-03-27 DIAGNOSIS — Z1211 Encounter for screening for malignant neoplasm of colon: Secondary | ICD-10-CM | POA: Diagnosis not present

## 2018-03-27 LAB — HEMOCCULT GUIAC POC 1CARD (OFFICE): Fecal Occult Blood, POC: NEGATIVE

## 2018-03-27 NOTE — Progress Notes (Signed)
Patient ID: Jasmine Black, female   DOB: 10/09/1950, 67 y.o.   MRN: [id number] History of Present Illness:  Jasmine Black is a 67 year old white female,married in for well woman gyn exam and pap. She is new pt, used to see Dr Jasmine Black, who retired. PCP is Dr Jasmine Black.  Current Medications, Allergies, Past Medical History, Past Surgical History, Family History and Social History were reviewed in [employer] [facility] electronic medical record.     Review of Systems: Patient denies any headaches, hearing loss, fatigue, blurred vision, shortness of breath, chest pain, abdominal pain, problems with bowel movements, urination, or intercourse(not having sex). No joint pain or mood swings.    Physical Exam:BP 124/67 (BP Location: Right Arm, Patient Position: Sitting, Cuff Size: Normal)   Pulse 81   Resp 18   Ht 5\' 1"  (1.549 m)   Wt 151 lb (68.5 kg)   BMI 28.53 kg/m  General:  Well developed, well nourished, no acute distress Skin:  Warm and dry Neck:  Midline trachea, normal thyroid, good ROM, no lymphadenopathy,no carotid bruits heard Lungs; Clear to auscultation bilaterally Breast:  No dominant palpable mass, retraction, or nipple discharge Cardiovascular: Regular rate and rhythm Abdomen:  Soft, non tender, no hepatosplenomegaly Pelvic:  External genitalia is normal in appearance, she has some brown areas on inner labia.  The vagina is normal in pale with loss of moisture and rugae. Urethra has small caruncle at opening.. The cervix is smooth, pap with HPV performed.  Uterus is felt to be normal size, shape, and contour.  No adnexal masses or tenderness noted.Bladder is non tender, no masses felt. Rectal: Good sphincter tone, no polyps, + hemorrhoids felt.  Hemoccult negative. Extremities/musculoskeletal:  No swelling, +varicosities noted, no clubbing or cyanosis Psych:  No mood changes, alert and cooperative,seems happy PHQ 2 score 0. Examination chaperoned by Federico Flake CMA.  Impression: 1. Encounter  for gynecological examination with Papanicolaou smear of cervix   2. Screening for colorectal cancer       Plan: Pap with HPV sent Physical in 2 years Mammogram yearly Labs with PCP Colonoscopy per GI

## 2018-03-27 NOTE — Addendum Note (Signed)
Addended by: Tish Frederickson A on: 03/27/2018 11:09 AM   Modules accepted: Orders

## 2018-03-29 LAB — CYTOLOGY - PAP
DIAGNOSIS: NEGATIVE
HPV (WINDOPATH): NOT DETECTED

## 2018-03-30 ENCOUNTER — Ambulatory Visit (HOSPITAL_COMMUNITY)
Admission: RE | Admit: 2018-03-30 | Discharge: 2018-03-30 | Disposition: A | Discharge status: Home / Self Care | Payer: PPO | Source: Ambulatory Visit | Attending: Physician Assistant | Admitting: Physician Assistant

## 2018-03-30 DIAGNOSIS — Z1231 Encounter for screening mammogram for malignant neoplasm of breast: Secondary | ICD-10-CM | POA: Insufficient documentation

## 2018-05-02 DIAGNOSIS — Z1389 Encounter for screening for other disorder: Secondary | ICD-10-CM | POA: Diagnosis not present

## 2018-05-02 DIAGNOSIS — R509 Fever, unspecified: Secondary | ICD-10-CM | POA: Diagnosis not present

## 2018-05-02 DIAGNOSIS — Z6827 Body mass index (BMI) 27.0-27.9, adult: Secondary | ICD-10-CM | POA: Diagnosis not present

## 2018-05-02 DIAGNOSIS — F419 Anxiety disorder, unspecified: Secondary | ICD-10-CM | POA: Diagnosis not present

## 2018-05-02 DIAGNOSIS — E663 Overweight: Secondary | ICD-10-CM | POA: Diagnosis not present

## 2018-05-02 DIAGNOSIS — M1991 Primary osteoarthritis, unspecified site: Secondary | ICD-10-CM | POA: Diagnosis not present

## 2018-05-05 DIAGNOSIS — M1991 Primary osteoarthritis, unspecified site: Secondary | ICD-10-CM | POA: Diagnosis not present

## 2018-05-05 DIAGNOSIS — R509 Fever, unspecified: Secondary | ICD-10-CM | POA: Diagnosis not present

## 2018-05-05 DIAGNOSIS — E663 Overweight: Secondary | ICD-10-CM | POA: Diagnosis not present

## 2018-05-05 DIAGNOSIS — I10 Essential (primary) hypertension: Secondary | ICD-10-CM | POA: Diagnosis not present

## 2018-05-05 DIAGNOSIS — Z6827 Body mass index (BMI) 27.0-27.9, adult: Secondary | ICD-10-CM | POA: Diagnosis not present

## 2018-05-08 ENCOUNTER — Other Ambulatory Visit (HOSPITAL_COMMUNITY): Payer: Self-pay | Admitting: Internal Medicine

## 2018-05-08 ENCOUNTER — Ambulatory Visit (HOSPITAL_COMMUNITY)
Admission: RE | Admit: 2018-05-08 | Discharge: 2018-05-08 | Disposition: A | Discharge status: Home / Self Care | Payer: PPO | Source: Ambulatory Visit | Attending: Internal Medicine | Admitting: Internal Medicine

## 2018-05-08 DIAGNOSIS — R05 Cough: Secondary | ICD-10-CM

## 2018-05-08 DIAGNOSIS — R059 Cough, unspecified: Secondary | ICD-10-CM

## 2018-05-08 DIAGNOSIS — R509 Fever, unspecified: Secondary | ICD-10-CM | POA: Diagnosis not present

## 2018-05-09 DIAGNOSIS — R0902 Hypoxemia: Secondary | ICD-10-CM | POA: Diagnosis not present

## 2018-05-09 DIAGNOSIS — R42 Dizziness and giddiness: Secondary | ICD-10-CM | POA: Diagnosis not present

## 2018-05-09 DIAGNOSIS — J189 Pneumonia, unspecified organism: Secondary | ICD-10-CM | POA: Diagnosis not present

## 2018-05-09 DIAGNOSIS — I4891 Unspecified atrial fibrillation: Secondary | ICD-10-CM | POA: Diagnosis not present

## 2018-05-11 ENCOUNTER — Encounter (HOSPITAL_COMMUNITY): Payer: Self-pay | Admitting: Emergency Medicine

## 2018-05-11 ENCOUNTER — Other Ambulatory Visit: Payer: Self-pay

## 2018-05-11 ENCOUNTER — Emergency Department (HOSPITAL_COMMUNITY): Payer: PPO

## 2018-05-11 ENCOUNTER — Inpatient Hospital Stay (HOSPITAL_COMMUNITY)
Admission: EM | Admit: 2018-05-11 | Discharge: 2018-05-28 | DRG: 870 | Disposition: E | Payer: PPO | Attending: Internal Medicine | Admitting: Internal Medicine

## 2018-05-11 DIAGNOSIS — D631 Anemia in chronic kidney disease: Secondary | ICD-10-CM | POA: Diagnosis present

## 2018-05-11 DIAGNOSIS — N179 Acute kidney failure, unspecified: Secondary | ICD-10-CM | POA: Diagnosis not present

## 2018-05-11 DIAGNOSIS — J159 Unspecified bacterial pneumonia: Secondary | ICD-10-CM | POA: Diagnosis present

## 2018-05-11 DIAGNOSIS — J189 Pneumonia, unspecified organism: Secondary | ICD-10-CM | POA: Diagnosis not present

## 2018-05-11 DIAGNOSIS — L8990 Pressure ulcer of unspecified site, unspecified stage: Secondary | ICD-10-CM

## 2018-05-11 DIAGNOSIS — R0602 Shortness of breath: Secondary | ICD-10-CM | POA: Diagnosis present

## 2018-05-11 DIAGNOSIS — Z79899 Other long term (current) drug therapy: Secondary | ICD-10-CM

## 2018-05-11 DIAGNOSIS — T501X5A Adverse effect of loop [high-ceiling] diuretics, initial encounter: Secondary | ICD-10-CM | POA: Diagnosis not present

## 2018-05-11 DIAGNOSIS — J9690 Respiratory failure, unspecified, unspecified whether with hypoxia or hypercapnia: Secondary | ICD-10-CM

## 2018-05-11 DIAGNOSIS — E785 Hyperlipidemia, unspecified: Secondary | ICD-10-CM

## 2018-05-11 DIAGNOSIS — E878 Other disorders of electrolyte and fluid balance, not elsewhere classified: Secondary | ICD-10-CM | POA: Diagnosis present

## 2018-05-11 DIAGNOSIS — R0902 Hypoxemia: Secondary | ICD-10-CM

## 2018-05-11 DIAGNOSIS — I493 Ventricular premature depolarization: Secondary | ICD-10-CM

## 2018-05-11 DIAGNOSIS — I059 Rheumatic mitral valve disease, unspecified: Secondary | ICD-10-CM | POA: Diagnosis present

## 2018-05-11 DIAGNOSIS — Z4659 Encounter for fitting and adjustment of other gastrointestinal appliance and device: Secondary | ICD-10-CM

## 2018-05-11 DIAGNOSIS — Z515 Encounter for palliative care: Secondary | ICD-10-CM | POA: Diagnosis present

## 2018-05-11 DIAGNOSIS — N183 Chronic kidney disease, stage 3 unspecified: Secondary | ICD-10-CM

## 2018-05-11 DIAGNOSIS — I129 Hypertensive chronic kidney disease with stage 1 through stage 4 chronic kidney disease, or unspecified chronic kidney disease: Secondary | ICD-10-CM | POA: Diagnosis present

## 2018-05-11 DIAGNOSIS — R0600 Dyspnea, unspecified: Secondary | ICD-10-CM | POA: Diagnosis not present

## 2018-05-11 DIAGNOSIS — Z7189 Other specified counseling: Secondary | ICD-10-CM

## 2018-05-11 DIAGNOSIS — D696 Thrombocytopenia, unspecified: Secondary | ICD-10-CM | POA: Diagnosis not present

## 2018-05-11 DIAGNOSIS — Z87891 Personal history of nicotine dependence: Secondary | ICD-10-CM

## 2018-05-11 DIAGNOSIS — M810 Age-related osteoporosis without current pathological fracture: Secondary | ICD-10-CM | POA: Diagnosis present

## 2018-05-11 DIAGNOSIS — R05 Cough: Secondary | ICD-10-CM | POA: Diagnosis not present

## 2018-05-11 DIAGNOSIS — E782 Mixed hyperlipidemia: Secondary | ICD-10-CM | POA: Diagnosis not present

## 2018-05-11 DIAGNOSIS — E870 Hyperosmolality and hypernatremia: Secondary | ICD-10-CM | POA: Diagnosis not present

## 2018-05-11 DIAGNOSIS — I471 Supraventricular tachycardia: Secondary | ICD-10-CM | POA: Diagnosis not present

## 2018-05-11 DIAGNOSIS — J440 Chronic obstructive pulmonary disease with acute lower respiratory infection: Secondary | ICD-10-CM | POA: Diagnosis present

## 2018-05-11 DIAGNOSIS — Z79890 Hormone replacement therapy (postmenopausal): Secondary | ICD-10-CM

## 2018-05-11 DIAGNOSIS — R944 Abnormal results of kidney function studies: Secondary | ICD-10-CM | POA: Diagnosis not present

## 2018-05-11 DIAGNOSIS — G9341 Metabolic encephalopathy: Secondary | ICD-10-CM | POA: Diagnosis not present

## 2018-05-11 DIAGNOSIS — I10 Essential (primary) hypertension: Secondary | ICD-10-CM

## 2018-05-11 DIAGNOSIS — K5900 Constipation, unspecified: Secondary | ICD-10-CM | POA: Diagnosis not present

## 2018-05-11 DIAGNOSIS — Z8673 Personal history of transient ischemic attack (TIA), and cerebral infarction without residual deficits: Secondary | ICD-10-CM

## 2018-05-11 DIAGNOSIS — E871 Hypo-osmolality and hyponatremia: Secondary | ICD-10-CM

## 2018-05-11 DIAGNOSIS — E039 Hypothyroidism, unspecified: Secondary | ICD-10-CM | POA: Diagnosis not present

## 2018-05-11 DIAGNOSIS — J181 Lobar pneumonia, unspecified organism: Secondary | ICD-10-CM

## 2018-05-11 DIAGNOSIS — G9389 Other specified disorders of brain: Secondary | ICD-10-CM | POA: Diagnosis not present

## 2018-05-11 DIAGNOSIS — N189 Chronic kidney disease, unspecified: Secondary | ICD-10-CM

## 2018-05-11 DIAGNOSIS — Z4682 Encounter for fitting and adjustment of non-vascular catheter: Secondary | ICD-10-CM | POA: Diagnosis not present

## 2018-05-11 DIAGNOSIS — A419 Sepsis, unspecified organism: Principal | ICD-10-CM

## 2018-05-11 DIAGNOSIS — Z881 Allergy status to other antibiotic agents status: Secondary | ICD-10-CM

## 2018-05-11 DIAGNOSIS — R6520 Severe sepsis without septic shock: Secondary | ICD-10-CM | POA: Diagnosis present

## 2018-05-11 DIAGNOSIS — E46 Unspecified protein-calorie malnutrition: Secondary | ICD-10-CM | POA: Diagnosis present

## 2018-05-11 DIAGNOSIS — Z8249 Family history of ischemic heart disease and other diseases of the circulatory system: Secondary | ICD-10-CM

## 2018-05-11 DIAGNOSIS — J441 Chronic obstructive pulmonary disease with (acute) exacerbation: Secondary | ICD-10-CM | POA: Diagnosis present

## 2018-05-11 DIAGNOSIS — I429 Cardiomyopathy, unspecified: Secondary | ICD-10-CM | POA: Diagnosis present

## 2018-05-11 DIAGNOSIS — J9601 Acute respiratory failure with hypoxia: Secondary | ICD-10-CM | POA: Diagnosis present

## 2018-05-11 DIAGNOSIS — R918 Other nonspecific abnormal finding of lung field: Secondary | ICD-10-CM | POA: Diagnosis not present

## 2018-05-11 DIAGNOSIS — Z6832 Body mass index (BMI) 32.0-32.9, adult: Secondary | ICD-10-CM

## 2018-05-11 DIAGNOSIS — J811 Chronic pulmonary edema: Secondary | ICD-10-CM | POA: Diagnosis not present

## 2018-05-11 DIAGNOSIS — Z978 Presence of other specified devices: Secondary | ICD-10-CM

## 2018-05-11 DIAGNOSIS — R000 Tachycardia, unspecified: Secondary | ICD-10-CM | POA: Diagnosis not present

## 2018-05-11 DIAGNOSIS — I739 Peripheral vascular disease, unspecified: Secondary | ICD-10-CM | POA: Diagnosis present

## 2018-05-11 DIAGNOSIS — I701 Atherosclerosis of renal artery: Secondary | ICD-10-CM | POA: Diagnosis present

## 2018-05-11 DIAGNOSIS — Z8709 Personal history of other diseases of the respiratory system: Secondary | ICD-10-CM

## 2018-05-11 DIAGNOSIS — Z0189 Encounter for other specified special examinations: Secondary | ICD-10-CM

## 2018-05-11 DIAGNOSIS — I6521 Occlusion and stenosis of right carotid artery: Secondary | ICD-10-CM | POA: Diagnosis present

## 2018-05-11 DIAGNOSIS — E875 Hyperkalemia: Secondary | ICD-10-CM | POA: Diagnosis not present

## 2018-05-11 DIAGNOSIS — K760 Fatty (change of) liver, not elsewhere classified: Secondary | ICD-10-CM | POA: Diagnosis not present

## 2018-05-11 DIAGNOSIS — I6529 Occlusion and stenosis of unspecified carotid artery: Secondary | ICD-10-CM | POA: Diagnosis present

## 2018-05-11 DIAGNOSIS — L89626 Pressure-induced deep tissue damage of left heel: Secondary | ICD-10-CM | POA: Diagnosis not present

## 2018-05-11 DIAGNOSIS — E038 Other specified hypothyroidism: Secondary | ICD-10-CM | POA: Diagnosis not present

## 2018-05-11 DIAGNOSIS — Z7982 Long term (current) use of aspirin: Secondary | ICD-10-CM

## 2018-05-11 LAB — BASIC METABOLIC PANEL
ANION GAP: 12 (ref 5–15)
BUN: 11 mg/dL (ref 8–23)
CHLORIDE: 96 mmol/L — AB (ref 98–111)
CO2: 21 mmol/L — AB (ref 22–32)
Calcium: 8.5 mg/dL — ABNORMAL LOW (ref 8.9–10.3)
Creatinine, Ser: 1.06 mg/dL — ABNORMAL HIGH (ref 0.44–1.00)
GFR calc Af Amer: >60 mL/min (ref >60)
GFR calc non Af Amer: 52 mL/min — ABNORMAL LOW (ref >60)
GLUCOSE: 149 mg/dL — AB (ref 70–99)
POTASSIUM: 3.6 mmol/L (ref 3.5–5.1)
Sodium: 129 mmol/L — ABNORMAL LOW (ref 135–145)

## 2018-05-11 LAB — INFLUENZA PANEL BY PCR (TYPE A & B)
INFLAPCR: NEGATIVE
INFLBPCR: NEGATIVE

## 2018-05-11 LAB — CBC WITH DIFFERENTIAL/PLATELET
ABS IMMATURE GRANULOCYTES: 0.09 10*3/uL — AB (ref 0.00–0.07)
Basophils Absolute: 0 10*3/uL (ref 0.0–0.1)
Basophils Relative: 0 %
Eosinophils Absolute: 0.6 10*3/uL — ABNORMAL HIGH (ref 0.0–0.5)
Eosinophils Relative: 6 %
HCT: 34.8 % — ABNORMAL LOW (ref 36.0–46.0)
HEMOGLOBIN: 11.3 g/dL — AB (ref 12.0–15.0)
IMMATURE GRANULOCYTES: 1 %
LYMPHS PCT: 13 %
Lymphs Abs: 1.5 10*3/uL (ref 0.7–4.0)
MCH: 28.9 pg (ref 26.0–34.0)
MCHC: 32.5 g/dL (ref 30.0–36.0)
MCV: 89 fL (ref 80.0–100.0)
MONO ABS: 0.7 10*3/uL (ref 0.1–1.0)
MONOS PCT: 6 %
NEUTROS ABS: 8.4 10*3/uL — AB (ref 1.7–7.7)
NEUTROS PCT: 74 %
PLATELETS: 525 10*3/uL — AB (ref 150–400)
RBC: 3.91 MIL/uL (ref 3.87–5.11)
RDW: 13.1 % (ref 11.5–15.5)
WBC: 11.3 10*3/uL — AB (ref 4.0–10.5)
nRBC: 0 % (ref 0.0–0.2)

## 2018-05-11 LAB — URINALYSIS, ROUTINE W REFLEX MICROSCOPIC
BILIRUBIN URINE: NEGATIVE
Glucose, UA: NEGATIVE mg/dL
HGB URINE DIPSTICK: NEGATIVE
KETONES UR: NEGATIVE mg/dL
Nitrite: NEGATIVE
PH: 6 (ref 5.0–8.0)
PROTEIN: NEGATIVE mg/dL
Specific Gravity, Urine: 1.01 (ref 1.005–1.030)

## 2018-05-11 LAB — LACTIC ACID, PLASMA
LACTIC ACID, VENOUS: 1.6 mmol/L (ref 0.5–1.9)
Lactic Acid, Venous: 2.9 mmol/L (ref 0.5–1.9)

## 2018-05-11 LAB — PROCALCITONIN: Procalcitonin: 0.19 ng/mL

## 2018-05-11 LAB — TROPONIN I: Troponin I: <0.03 ng/mL (ref <0.03)

## 2018-05-11 MED ORDER — LORATADINE 10 MG PO TABS
10.0000 mg | ORAL_TABLET | Freq: Every day | ORAL | Status: DC
  Administered 2018-05-12 – 2018-05-15 (×4): 10 mg via ORAL
  Filled 2018-05-11 (×4): qty 1

## 2018-05-11 MED ORDER — LEVOFLOXACIN IN D5W 500 MG/100ML IV SOLN
500.0000 mg | INTRAVENOUS | Status: DC
  Administered 2018-05-12 – 2018-05-13 (×2): 500 mg via INTRAVENOUS
  Filled 2018-05-11 (×2): qty 100

## 2018-05-11 MED ORDER — SODIUM CHLORIDE 0.9 % IV SOLN
INTRAVENOUS | Status: DC
  Administered 2018-05-11 – 2018-05-13 (×6): via INTRAVENOUS

## 2018-05-11 MED ORDER — IPRATROPIUM-ALBUTEROL 0.5-2.5 (3) MG/3ML IN SOLN
3.0000 mL | Freq: Once | RESPIRATORY_TRACT | Status: AC
  Administered 2018-05-11: 3 mL via RESPIRATORY_TRACT
  Filled 2018-05-11: qty 3

## 2018-05-11 MED ORDER — ORAL CARE MOUTH RINSE
15.0000 mL | Freq: Two times a day (BID) | OROMUCOSAL | Status: DC
  Administered 2018-05-12 – 2018-05-16 (×9): 15 mL via OROMUCOSAL

## 2018-05-11 MED ORDER — ACETAMINOPHEN 325 MG PO TABS
650.0000 mg | ORAL_TABLET | Freq: Four times a day (QID) | ORAL | Status: DC | PRN
  Administered 2018-05-18 – 2018-05-23 (×5): 650 mg via ORAL
  Filled 2018-05-11 (×6): qty 2

## 2018-05-11 MED ORDER — ACETAMINOPHEN 650 MG RE SUPP: 650.0000 mg | Freq: Four times a day (QID) | RECTAL | Status: DC | PRN

## 2018-05-11 MED ORDER — ASPIRIN 81 MG PO CHEW
81.0000 mg | CHEWABLE_TABLET | ORAL | Status: DC
  Administered 2018-05-12 – 2018-05-14 (×2): 81 mg via ORAL
  Filled 2018-05-11 (×4): qty 1

## 2018-05-11 MED ORDER — ONDANSETRON HCL 4 MG/2ML IJ SOLN: 4.0000 mg | Freq: Four times a day (QID) | INTRAMUSCULAR | Status: DC | PRN

## 2018-05-11 MED ORDER — GUAIFENESIN ER 600 MG PO TB12
1200.0000 mg | ORAL_TABLET | Freq: Every day | ORAL | Status: DC
  Administered 2018-05-12 – 2018-05-15 (×4): 1200 mg via ORAL
  Filled 2018-05-11 (×4): qty 2

## 2018-05-11 MED ORDER — PSEUDOEPHEDRINE-GUAIFENESIN ER 120-1200 MG PO TB12: 1.0000 | ORAL_TABLET | Freq: Every day | ORAL | Status: DC

## 2018-05-11 MED ORDER — ONDANSETRON HCL 4 MG PO TABS: 4.0000 mg | ORAL_TABLET | Freq: Four times a day (QID) | ORAL | Status: DC | PRN

## 2018-05-11 MED ORDER — PRAVASTATIN SODIUM 40 MG PO TABS
40.0000 mg | ORAL_TABLET | Freq: Every evening | ORAL | Status: DC
  Administered 2018-05-11 – 2018-05-15 (×5): 40 mg via ORAL
  Filled 2018-05-11 (×4): qty 1

## 2018-05-11 MED ORDER — ENOXAPARIN SODIUM 40 MG/0.4ML ~~LOC~~ SOLN
40.0000 mg | SUBCUTANEOUS | Status: DC
  Administered 2018-05-11 – 2018-05-20 (×10): 40 mg via SUBCUTANEOUS
  Filled 2018-05-11 (×10): qty 0.4

## 2018-05-11 MED ORDER — SODIUM CHLORIDE 0.9 % IV BOLUS
1000.0000 mL | Freq: Once | INTRAVENOUS | Status: AC
  Administered 2018-05-11: 1000 mL via INTRAVENOUS

## 2018-05-11 MED ORDER — ALPRAZOLAM 0.5 MG PO TABS
0.5000 mg | ORAL_TABLET | Freq: Every day | ORAL | Status: DC | PRN
  Administered 2018-05-12 – 2018-05-15 (×3): 0.5 mg via ORAL
  Filled 2018-05-11 (×3): qty 1

## 2018-05-11 MED ORDER — VERAPAMIL HCL ER 240 MG PO TBCR
240.0000 mg | EXTENDED_RELEASE_TABLET | Freq: Every day | ORAL | Status: DC
  Administered 2018-05-11 – 2018-05-16 (×6): 240 mg via ORAL
  Filled 2018-05-11 (×6): qty 1

## 2018-05-11 MED ORDER — PSEUDOEPHEDRINE HCL ER 120 MG PO TB12
120.0000 mg | ORAL_TABLET | Freq: Every day | ORAL | Status: DC
  Administered 2018-05-12 – 2018-05-15 (×4): 120 mg via ORAL
  Filled 2018-05-11 (×8): qty 1

## 2018-05-11 MED ORDER — LEVOFLOXACIN IN D5W 750 MG/150ML IV SOLN
750.0000 mg | INTRAVENOUS | Status: DC
  Administered 2018-05-11: 750 mg via INTRAVENOUS
  Filled 2018-05-11: qty 150

## 2018-05-11 MED ORDER — ZOLPIDEM TARTRATE 5 MG PO TABS
5.0000 mg | ORAL_TABLET | Freq: Every evening | ORAL | Status: DC | PRN
  Administered 2018-05-11 – 2018-05-17 (×5): 5 mg via ORAL
  Filled 2018-05-11 (×5): qty 1

## 2018-05-11 MED ORDER — LEVOTHYROXINE SODIUM 50 MCG PO TABS
50.0000 ug | ORAL_TABLET | ORAL | Status: AC
  Administered 2018-05-13 – 2018-05-14 (×2): 50 ug via ORAL
  Filled 2018-05-11 (×2): qty 1

## 2018-05-11 MED ORDER — LEVOTHYROXINE SODIUM 75 MCG PO TABS
75.0000 ug | ORAL_TABLET | ORAL | Status: DC
  Administered 2018-05-12 – 2018-05-17 (×4): 75 ug via ORAL
  Filled 2018-05-11 (×4): qty 1

## 2018-05-11 MED ORDER — ALBUTEROL SULFATE (2.5 MG/3ML) 0.083% IN NEBU
5.0000 mg | INHALATION_SOLUTION | Freq: Once | RESPIRATORY_TRACT | Status: DC
  Filled 2018-05-11: qty 6

## 2018-05-11 MED ORDER — SODIUM CHLORIDE 0.9% FLUSH
3.0000 mL | Freq: Two times a day (BID) | INTRAVENOUS | Status: DC
  Administered 2018-05-11 – 2018-05-23 (×17): 3 mL via INTRAVENOUS

## 2018-05-11 NOTE — ED Notes (Signed)
CRITICAL VALUE ALERT  Critical Value:  Lactic Acid 2.9  Date & Time Notied:  08/05/17  Provider Notified: Dr Clarene DukeMcManus  Orders Received/Actions taken: see new orders.

## 2018-05-11 NOTE — Progress Notes (Signed)
PHARMACY NOTE:  ANTIMICROBIAL RENAL DOSAGE ADJUSTMENT  Current antimicrobial regimen includes a mismatch between antimicrobial dosage and estimated renal function.  As per policy approved by the Pharmacy & Therapeutics and Medical Executive Committees, the antimicrobial dosage will be adjusted accordingly.  Current antimicrobial dosage:  Levaquin 750mg  IV q24h  Indication: CAP  Renal Function:  Estimated Creatinine Clearance: 42.8 mL/min (A) (by C-G formula based on SCr of 1.06 mg/dL (H)). []      On intermittent HD, scheduled: []      On CRRT    Antimicrobial dosage has been changed to:  Levaquin 500mg  IV q24h for CrCl 30-6050ml/min.  Additional comments:   Thank you for allowing pharmacy to be a part of this patient's care.  Claybon Jabsngel, Nathan Moctezuma G, [facility] 05/22/2018 6:39 PM

## 2018-05-11 NOTE — ED Triage Notes (Addendum)
Patient was diagnosed with PNU by her primary doctor last Monday. Patient has been using nebulizer treatments at home with the last being at 1030 this morning. Patient presents with oxygen saturations of 88 on room air, she was placed on 2L nasal cannula and rebounded to 94 percent.

## 2018-05-11 NOTE — ED Provider Notes (Signed)
[facility]ENCY [facility]PARTMENT Provider Note   CSN: [id number] Arrival date & time: 05/14/2018  1118     History   Chief Complaint Chief Complaint  Patient presents with  . Shortness of Breath    HPI Jasmine Black is a 67 y.o. female.  HPI Pt was seen at 1145. Per pt and her family, c/o gradual onset and worsening of persistent SOB and cough for the past 2 weeks, worse over the past week. Has been associated with home fevers to "100.7." Pt states she was evaluated by her PMD 3 days ago for her symptoms, dx pneumonia, rx doxycycline and neb treatments. Pt states today she felt more SOB, lightheaded when attempting to walk and "thought I was going to pass out." Pt's family states pt's O2 Sats were "84% R/A." On arrival to ED, pt's O2 Sats were 88% R/A. Denies CP/palpitations, no abd pain, no N/V/D, no back pain, no rash.    Past Medical History:  Diagnosis Date  . Arthritis    Osteoprosis  . Chest pain   . Edema   . Hypertension 01/07/2012   Mildly abnormal renal doppler  . 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 PAD (peripheral artery disease) (HCC) 12/2009   right carotid endarterectomy  . Palpitation   . Renal artery stenosis (HCC)   . 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 Thyroid disease     Patient Active Problem List   Diagnosis Date Noted  . 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 HTN (hypertension) 11/22/2012  . H/O cardiomyopathy 11/22/2012  . Hypothyroidism 11/22/2012  . Hyperlipidemia 11/22/2012  . Carotid stenosis 04/28/2012    Past Surgical History:  Procedure Laterality Date  . CARDIAC CATHETERIZATION  01/27/07   Normal right heart pressues, normal pulmonary capillary wedge pressure, normal cardiac output and cardiac index, she was normotensive intra-arterially, she had an EF 50%-55% with out reginal wall motion abnormality, no evidence of renal  artery or PAD  . CAROTID ENDARTERECTOMY  January 08, 2010   Right cea  . EYE SURGERY     Cataract  . INNER EAR SURGERY    . [place] Disease     Hand  . TONSILLECTOMY    . torn cartlidge     Knee     OB History    Gravida  2   Para  2   Term      Preterm      AB      Living  2     SAB      TAB      Ectopic      Multiple      Live Births               Home Medications    Prior to Admission medications   Medication Sig Start Date End Date Taking? Authorizing Provider  ALPRAZolam Prudy Feeler(XANAX) 0.5 MG tablet as needed.  04/07/12   [provider]  aspirin 81 MG tablet Take 81 mg by mouth every other day.     [provider]  calcium citrate-vitamin D (CITRACAL+D) 315-200 MG-UNIT per tablet Take 1 tablet by mouth daily.    [provider]  levothyroxine (SYNTHROID, LEVOTHROID) 75 MCG tablet Take 75 mcg by mouth daily. 04/28/15   [provider]  lisinopril (PRINIVIL,ZESTRIL) 20 MG tablet TAKE 1/2 TABLET BY MOUTH ONCE DAILY. 01/03/17   Croitoru, Rachelle HoraMihai, MD  pravastatin (  PRAVACHOL) 40 MG tablet TAKE ONE TABLET BY MOUTH IN THE EVENING. 12/28/17   Croitoru, Mihai, MD  verapamil (VERELAN PM) 240 MG 24 hr capsule TAKE (1) CAPSULE BY MOUTH AT BEDTIME. 02/28/18   Croitoru, Mihai, MD  zolpidem (AMBIEN) 10 MG tablet at bedtime.  03/30/12   [provider]    Family History Family History  Problem Relation Age of Onset  . Heart disease Mother        Congestive Heart Failure  . Heart attack Father   . Heart disease Sister   . Scoliosis Son     Social History Social History   Tobacco Use  . Smoking status: Former Smoker    Last attempt to quit: 09/19/2007    Years since quitting: 10.6  . Smokeless tobacco: Never Used  Substance Use Topics  . Alcohol use: No  . Drug use: No     Allergies   Neosporin [neomycin-bacitracin zn-polymyx]   Review of Systems Review of Systems ROS: Statement: All systems negative except as marked  or noted in the HPI; Constitutional: +fever and chills. ; ; Eyes: Negative for eye pain, redness and discharge. ; ; ENMT: Negative for ear pain, hoarseness, nasal congestion, sinus pressure and sore throat. ; ; Cardiovascular: Negative for chest pain, palpitations, diaphoresis, and peripheral edema. ; ; Respiratory: +cough, SOB. Negative for wheezing and stridor. ; ; Gastrointestinal: Negative for nausea, vomiting, diarrhea, abdominal pain, blood in stool, hematemesis, jaundice and rectal bleeding. . ; ; Genitourinary: Negative for dysuria, flank pain and hematuria. ; ; Musculoskeletal: Negative for back pain and neck pain. Negative for swelling and trauma.; ; Skin: Negative for pruritus, rash, abrasions, blisters, bruising and skin lesion.; ; Neuro: +lightheadedness, generalized weakness. Negative for headache and neck stiffness. Negative for altered level of consciousness, altered mental status, extremity weakness, paresthesias, involuntary movement, seizure and syncope.      Physical Exam Updated Vital Signs BP (!) 131/56 (BP Location: Right Arm)   Pulse (!) 112   Temp 98.4 F (36.9 C) (Oral)   Resp 18   Ht 5' (1.524 m)   Wt 67.1 kg   SpO2 92%   BMI 28.90 kg/m    Patient Vitals for the past 24 hrs:  BP Temp Temp src Pulse Resp SpO2 Height Weight  05/16/2018 1430 (!) 113/56 - - 96 (!) 25 94 % - -  05/18/2018 1400 (!) 106/51 - - (!) 105 20 92 % - -  05/05/2018 1330 (!) 109/49 - - (!) 106 (!) 23 93 % - -  05/13/2018 1300 103/77 - - (!) 113 (!) 24 91 % - -  05/26/2018 1230 (!) 119/55 - - (!) 114 (!) 28 93 % - -  05/19/2018 1200 (!) 117/42 - - (!) 115 (!) 27 95 % - -  05/18/2018 1159 - - - - - 92 % - -  05/16/2018 1128 - - - - - - 5' (1.524 m) 67.1 kg  05/20/2018 1127 - - - - - 93 % - -  04/30/2018 1123 (!) 131/56 98.4 F (36.9 C) Oral (!) 112 18 (!) 88 % - -     Physical Exam 1150: Physical examination:  Nursing notes reviewed; Vital signs and O2 SAT reviewed;  Constitutional: Well developed, Well  nourished, Well hydrated, In no acute distress; Head:  Normocephalic, atraumatic; Eyes: EOMI, PERRL, No scleral icterus; ENMT: Mouth and pharynx normal, Mucous membranes moist; Neck: Supple, Full range of motion, No lymphadenopathy; Cardiovascular: Tachycardic rate and rhythm, No  gallop; Respiratory: Breath sounds coarse & equal bilaterally, No wheezes. Speaking full sentences, Normal respiratory effort/excursion; Chest: Nontender, Movement normal; Abdomen: Soft, Nontender, Nondistended, Normal bowel sounds; Genitourinary: No CVA tenderness; Extremities: Peripheral pulses normal, No tenderness, No edema, No calf edema or asymmetry.; Neuro: AA&Ox3, Major CN grossly intact.  Speech clear. No gross focal motor or sensory deficits in extremities.; Skin: Color normal, Warm, Dry.    ED Treatments / Results  Labs (all labs ordered are listed, but only abnormal results are displayed)   EKG EKG Interpretation  Date/Time:  Thursday 05/29/2018 11:43:03 EST Ventricular Rate:  115 PR Interval:    QRS Duration: 94 QT Interval:  338 QTC Calculation: 468 R Axis:   7 Text Interpretation:  Sinus tachycardia Low voltage, precordial leads Borderline T wave abnormalities Baseline wander No old tracing to compare Confirmed by Samuel Jester [phone]) on 05/29/2018 12:13:55 PM   Radiology   Procedures Procedures (including critical care time)  Medications Ordered in ED Medications  ipratropium-albuterol (DUONEB) 0.5-2.5 (3) MG/3ML nebulizer solution 3 mL (3 mLs Nebulization Given 29-May-2018 1159)     Initial Impression / Assessment and Plan / ED Course  I have reviewed the triage vital signs and the nursing notes.  Pertinent labs & imaging results that were available during my care of the patient were reviewed by me and considered in my medical decision making (see chart for details).  MDM Reviewed: previous chart, nursing note and vitals Reviewed previous: labs, ECG and x-ray Interpretation:  labs, ECG and x-ray Total time providing critical care: 30-74 minutes. This excludes time spent performing separately reportable procedures and services. Consults: admitting MD    CRITICAL CARE Performed by: Samuel Jester Total critical care time: 35 minutes Critical care time was exclusive of separately billable procedures and treating other patients. Critical care was necessary to treat or prevent imminent or life-threatening deterioration. Critical care was time spent personally by me on the following activities: development of treatment plan with patient and/or surrogate as well as nursing, discussions with consultants, evaluation of patient's response to treatment, examination of patient, obtaining history from patient or surrogate, ordering and performing treatments and interventions, ordering and review of laboratory studies, ordering and review of radiographic studies, pulse oximetry and re-evaluation of patient's condition.   Results for orders placed or performed during the hospital encounter of 05/29/18  Culture, blood (routine x 2)  Result Value Ref Range   Specimen Description BLOOD RIGHT ARM    Special Requests      BOTTLES DRAWN AEROBIC AND ANAEROBIC Blood Culture adequate volume Performed at [facility], [address], [place], [place] [address]    Culture PENDING    Report Status PENDING   Culture, blood (routine x 2)  Result Value Ref Range   Specimen Description BLOOD RIGHT HAND    Special Requests      BOTTLES DRAWN AEROBIC AND ANAEROBIC Blood Culture adequate volume Performed at [facility], [address] [place], [place] [address]    Culture PENDING    Report Status PENDING   Basic metabolic panel  Result Value Ref Range   Sodium 129 (L) 135 - 145 mmol/L   Potassium 3.6 3.5 - 5.1 mmol/L   Chloride 96 (L) 98 - 111 mmol/L   CO2 21 (L) 22 - 32 mmol/L   Glucose, Bld 149 (H) 70 - 99 mg/dL   BUN 11 8 - 23 mg/dL   Creatinine, Ser 9.14 (H) 0.44 - 1.00  mg/dL   Calcium  8.5 (L) 8.9 - 10.3 mg/dL   GFR calc non Af Amer 52 (L) >60 mL/min   GFR calc Af Amer >60 >60 mL/min   Anion gap 12 5 - 15  Troponin I - Once  Result Value Ref Range   Troponin I <0.03 <0.03 ng/mL  Lactic acid, plasma  Result Value Ref Range   Lactic Acid, Venous 2.9 (HH) 0.5 - 1.9 mmol/L  Lactic acid, plasma  Result Value Ref Range   Lactic Acid, Venous 1.6 0.5 - 1.9 mmol/L  CBC with Differential  Result Value Ref Range   WBC 11.3 (H) 4.0 - 10.5 K/uL   RBC 3.91 3.87 - 5.11 MIL/uL   Hemoglobin 11.3 (L) 12.0 - 15.0 g/dL   HCT 40.9 (L) 81.1 - 91.4 %   MCV 89.0 80.0 - 100.0 fL   MCH 28.9 26.0 - 34.0 pg   MCHC 32.5 30.0 - 36.0 g/dL   RDW 78.2 95.6 - 21.3 %   Platelets 525 (H) 150 - 400 K/uL   nRBC 0.0 0.0 - 0.2 %   Neutrophils Relative % 74 %   Neutro Abs 8.4 (H) 1.7 - 7.7 K/uL   Lymphocytes Relative 13 %   Lymphs Abs 1.5 0.7 - 4.0 K/uL   Monocytes Relative 6 %   Monocytes Absolute 0.7 0.1 - 1.0 K/uL   Eosinophils Relative 6 %   Eosinophils Absolute 0.6 (H) 0.0 - 0.5 K/uL   Basophils Relative 0 %   Basophils Absolute 0.0 0.0 - 0.1 K/uL   Immature Granulocytes 1 %   Abs Immature Granulocytes 0.09 (H) 0.00 - 0.07 K/uL  Influenza panel by PCR (type A & B)  Result Value Ref Range   Influenza A By PCR NEGATIVE NEGATIVE   Influenza B By PCR NEGATIVE NEGATIVE   Dg Chest 2 View Result Date: 05/08/2018 CLINICAL DATA:  Recent pneumonia with persistent cough, fever, and shortness of breath. EXAM: CHEST - 2 VIEW COMPARISON:  May 08, 2018 FINDINGS: Extensive airspace disease is noted throughout much of the right upper and lower lobe regions. There is also patchy infiltrate in the left lower lobe. The degree of consolidation on the right is stable. There is slight the increased consolidation in the left base. Heart size and pulmonary vascularity are normal. No adenopathy. No bone lesions. IMPRESSION: Multifocal pneumonia. Areas of airspace opacity in the right upper and  lower lobes appears essentially stable compared to 3 days prior. Increase in infiltrate left base. Stable cardiac silhouette. No adenopathy evident. Electronically Signed   By: Bretta Bang III M.D.   On: 05/05/2018 12:57    Results for JANDA, CARGO (MRN [id number]) as of 05/19/2018 13:35  Ref. Range 01/02/2015 11:39 05/06/2015 10:17 05/07/2016 11:03 05/06/2017 08:22 05/18/2018 11:52  Sodium Latest Ref Range: 135 - 145 mmol/L 139 139 139 140 129 (L)    1500:  Judicious IVF given for mildly elevated lactic acid with improvement. Short neb given for hypoxia. Pt holding Sats 93-95% on O2 2L N/C while at rest. IV levaquin started for multifocal pneumonia. Dx and testing d/w pt and family.  Questions answered.  Verb understanding, agreeable to admit.  T/C returned from [facility] Jarvis Newcomer, case discussed, including:  HPI, pertinent PM/SHx, VS/PE, dx testing, ED course and treatment:  Agreeable to admit.     Final Clinical Impressions(s) / ED Diagnoses   Final diagnoses:  None    ED Discharge Orders    None       Samuel Jester,  DO 05/15/18 2133

## 2018-05-11 NOTE — H&P (Signed)
History and Physical   Jasmine Black WJX:[id number] DOB: 06-Nov-1950 DOA: 05/27/2018  Referring MD/NP/PA: Dr. Clarene Black, EDP PCP: Jasmine Nevins, MD Outpatient Specialists: Cardiology, Dr. Royann Shivers  Patient coming from: Home  Chief Complaint: Weakness, shortness of breath  HPI: Jasmine Black is a 67 y.o. 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 She had gone to her PCP a few days prior for a week of fevers, chills, increasing cough, and diagnosed with pneumonia, started on doxycycline and nebulized breathing treatments at home but has had no improvement. Over the past 24 hours she's grown increasingly weak, getting up out of bed rarely, much more short of breath when she attempts this and constantly feels like she's about to pass out. This is improved with rest. No chest pain or palpitations or true syncope. Hasn't missed doses of abx or had abd pain, N/V/D. She's been taking mucinex every 4 hours, even though it's supposed to be every 12 hours.   ED Course: Afebrile, but was 100.66F at home prior to taking tylenol. Tachycardic with soft BP, hypoxic to 88% on room air, improved with 2L O2, tachypneic. WBC 11.3k, flu negative, lactic acid elevated. Levaquin and IV fluids given after blood cultures drawn.   Review of Systems: No sick contacts, no rashes or myalgias or congestion/runny nose, no sputum production, no vomiting but has decreased appetite, eating very little but still drinking water, and per HPI. All others reviewed and are negative.   Past Medical History:  Diagnosis Date  . Arthritis    Osteoprosis  . Chest pain   . Edema   . Hypertension 01/07/2012   Mildly abnormal renal doppler  . 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 PAD (peripheral artery disease) (HCC) 12/2009   right carotid endarterectomy  .  Palpitation   . Renal artery stenosis (HCC)   . 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 Thyroid disease    Past Surgical History:  Procedure Laterality Date  . CARDIAC CATHETERIZATION  01/27/07   Normal right heart pressues, normal pulmonary capillary wedge pressure, normal cardiac output and cardiac index, she was normotensive intra-arterially, she had an EF 50%-55% with out reginal wall motion abnormality, no evidence of renal artery or PAD  . CAROTID ENDARTERECTOMY  January 08, 2010   Right cea  . EYE SURGERY     Cataract  . INNER EAR SURGERY    . [place] Disease     Hand  . TONSILLECTOMY    . torn cartlidge     Knee   - Former smoker, denies hx COPD. No EtOH or other drugs.  reports that she quit smoking about 10 years ago. She has never used smokeless tobacco. She reports that she does not drink alcohol or use drugs. Allergies  Allergen Reactions  . Neosporin [Neomycin-Bacitracin Zn-Polymyx] Rash   Family History  Problem Relation Age of Onset  . Heart disease Mother        Congestive Heart Failure  . Heart attack Father   . Heart disease Sister   . Scoliosis Son    - Family history otherwise reviewed and not pertinent.  Prior to Admission medications   Medication Sig Start Date End Date Taking? Authorizing Provider  ALPRAZolam Prudy Feeler) 0.5 MG tablet Take 0.5 mg by mouth daily as needed.  04/07/12  Yes  [provider]  aspirin 81 MG tablet Take 81 mg by mouth every other day.    Yes [provider]  cetirizine (ZYRTEC ALLERGY) 10 MG tablet Take 10 mg by mouth daily.   Yes [provider]  doxycycline (VIBRA-TABS) 100 MG tablet Take 100 mg by mouth 2 (two) times daily.   Yes [provider]  levothyroxine (SYNTHROID, LEVOTHROID) 50 MCG tablet Take 50 mcg by mouth as directed. Patient takes 50mcg on Saturday and Sunday; all other days she takes 75mcg 04/28/15  Yes [provider]    levothyroxine (SYNTHROID, LEVOTHROID) 75 MCG tablet Take 75 mcg by mouth as directed. Patient takes 75mcg every day except Saturday and Sunday; on Saturday and Sunday, she takes 50mcg   Yes [provider]  lisinopril (PRINIVIL,ZESTRIL) 20 MG tablet TAKE 1/2 TABLET BY MOUTH ONCE DAILY. 01/03/17  Yes Croitoru, Mihai, MD  pravastatin (PRAVACHOL) 40 MG tablet TAKE ONE TABLET BY MOUTH IN THE EVENING. 12/28/17  Yes Croitoru, Mihai, MD  Pseudoephedrine-Guaifenesin (MUCINEX D MAX STRENGTH) [phone] MG TB12 Take 1 tablet by mouth daily.   Yes [provider]  verapamil (VERELAN PM) 240 MG 24 hr capsule TAKE (1) CAPSULE BY MOUTH AT BEDTIME. 02/28/18  Yes Croitoru, Mihai, MD  zolpidem (AMBIEN) 10 MG tablet Take 10 mg by mouth at bedtime.  03/30/12  Yes [provider]    Physical Exam: Vitals:   2018/02/03 1330 2018/02/03 1400 2018/02/03 1430 2018/02/03 1530  BP: (!) 109/49 (!) 106/51 (!) 113/56 (!) 115/59  Pulse: (!) 106 (!) 105 96 (!) 104  Resp: (!) 23 20 (!) 25 (!) 24  Temp:      TempSrc:      SpO2: 93% 92% 94% 94%  Weight:      Height:       Constitutional: 67 y.o. female in no distress, calm demeanor Eyes: Lids and conjunctivae normal, PERRL ENMT: Mucous membranes are moist. Posterior pharynx clear of any exudate or lesions. Fair dentition.  Neck: normal, supple, no masses, no thyromegaly Respiratory: Non-labored tachypnea with supplemental oxygen, crackles bilaterally in mid and lower zones. Cardiovascular: Regular rate and rhythm, no murmurs, rubs, or gallops. No carotid bruits. No JVD. No LE edema. + pedal pulses. Abdomen: Normoactive bowel sounds. No tenderness, non-distended, and no masses palpated. No hepatosplenomegaly. GU: No indwelling catheter Musculoskeletal: No clubbing / cyanosis. No joint deformity upper and lower extremities. Good ROM, no contractures. Normal muscle tone.  Skin: Warm, dry. No rashes, wounds, or ulcers. No significant lesions noted.  Neurologic:  CN II-XII grossly intact. Speech normal. Diffusely weak but no focal deficits in motor strength or sensation in all extremities.  Psychiatric: Alert and oriented x3. Normal judgment and insight. Mood euthymic with congruent affect.   Labs on Admission: I have personally reviewed following labs and imaging studies  CBC: Recent Labs  Lab 2018/02/03 1152  WBC 11.3*  NEUTROABS 8.4*  HGB 11.3*  HCT 34.8*  MCV 89.0  PLT 525*   Basic Metabolic Panel: Recent Labs  Lab 2018/02/03 1152  NA 129*  K 3.6  CL 96*  CO2 21*  GLUCOSE 149*  BUN 11  CREATININE 1.06*  CALCIUM 8.5*   GFR: Estimated Creatinine Clearance: 42.8 mL/min (A) (by C-G formula based on SCr of 1.06 mg/dL (H)). Liver Function Tests: No results for input(s): AST, ALT, ALKPHOS, BILITOT, PROT, ALBUMIN in the last 168 hours. No results for input(s): LIPASE, AMYLASE in the last 168 hours. No results for input(s): AMMONIA in  the last 168 hours. Coagulation Profile: No results for input(s): INR, PROTIME in the last 168 hours. Cardiac Enzymes: Recent Labs  Lab 06-02-2018 1152  TROPONINI <0.03   BNP (last 3 results) No results for input(s): PROBNP in the last 8760 hours. HbA1C: No results for input(s): HGBA1C in the last 72 hours. CBG: No results for input(s): GLUCAP in the last 168 hours. Lipid Profile: No results for input(s): CHOL, HDL, LDLCALC, TRIG, CHOLHDL, LDLDIRECT in the last 72 hours. Thyroid Function Tests: No results for input(s): TSH, T4TOTAL, FREET4, T3FREE, THYROIDAB in the last 72 hours. Anemia Panel: No results for input(s): VITAMINB12, FOLATE, FERRITIN, TIBC, IRON, RETICCTPCT in the last 72 hours. Urine analysis:    Component Value Date/Time   COLORURINE YELLOW 01/07/2010 1316   APPEARANCEUR CLEAR 01/07/2010 1316   LABSPEC 1.016 01/07/2010 1316   PHURINE 6.0 01/07/2010 1316   GLUCOSEU NEGATIVE 01/07/2010 1316   HGBUR NEGATIVE 01/07/2010 1316   BILIRUBINUR NEGATIVE 01/07/2010 1316   KETONESUR  NEGATIVE 01/07/2010 1316   PROTEINUR NEGATIVE 01/07/2010 1316   UROBILINOGEN 0.2 01/07/2010 1316   NITRITE NEGATIVE 01/07/2010 1316   LEUKOCYTESUR  01/07/2010 1316    NEGATIVE MICROSCOPIC NOT DONE ON URINES WITH NEGATIVE PROTEIN, BLOOD, LEUKOCYTES, NITRITE, OR GLUCOSE <1000 mg/dL.    Recent Results (from the past 240 hour(s))  Culture, blood (routine x 2)     Status: None (Preliminary result)   Collection Time: 06/02/18 12:38 PM  Result Value Ref Range Status   Specimen Description BLOOD RIGHT ARM  Final   Special Requests   Final    BOTTLES DRAWN AEROBIC AND ANAEROBIC Blood Culture adequate volume Performed at [facility], [address], [place], [place] [address]    Culture PENDING  Incomplete   Report Status PENDING  Incomplete  Culture, blood (routine x 2)     Status: None (Preliminary result)   Collection Time: Jun 02, 2018 12:38 PM  Result Value Ref Range Status   Specimen Description BLOOD RIGHT HAND  Final   Special Requests   Final    BOTTLES DRAWN AEROBIC AND ANAEROBIC Blood Culture adequate volume Performed at [facility], [address], [place], [place] [address]    Culture PENDING  Incomplete   Report Status PENDING  Incomplete     Radiological Exams on Admission: Dg Chest 2 View  Result Date: 2018-06-02 CLINICAL DATA:  Recent pneumonia with persistent cough, fever, and shortness of breath. EXAM: CHEST - 2 VIEW COMPARISON:  May 08, 2018 FINDINGS: Extensive airspace disease is noted throughout much of the right upper and lower lobe regions. There is also patchy infiltrate in the left lower lobe. The degree of consolidation on the right is stable. There is slight the increased consolidation in the left base. Heart size and pulmonary vascularity are normal. No adenopathy. No bone lesions. IMPRESSION: Multifocal pneumonia. Areas of airspace opacity in the right upper and lower lobes appears essentially stable compared to 3 days prior. Increase in infiltrate left base.  Stable cardiac silhouette. No adenopathy evident. Electronically Signed   By: Bretta Bang III M.D.   On: 06-02-2018 12:57    EKG: Independently reviewed. Sinus tachycardia with diminutive T waves, not appreciably changed from priors. No ectopy.  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  - Continue supplemental oxygen today, attempt wean if symptomatically improving in AM. - Treat pneumonia as below.  Sepsis due to multilobar pneumonia: Stable RUL, RLL and worsening LLL infiltrates on CXR despite sufficient trial of doxycycline as an outpatient. Flu negative. - Levaquin IV q24h, anticipate conversion to po with clinical improvement - Check and trend PCT - Monitor blood cultures, attempt sputum culture - Lactic acid improved, BP seems to be coming up with IV fluids.  HTN: Currently soft pressures, will hold lisinopril  PVCs: Troponin negative, no ectopy on ECG. - Continue verapamil and continue telemetry with ongoing faintness/presyncope. Most likely related to pneumonia, but with hx PVCs need to evaluate.  Hypothyroidism:  - Continue home variable dosing. Ordered through 11/17, will need reordered if remains in the hospital at that time.   Thrombocytosis: Reactive, monitor.   Hyponatremia, hypochloremia: Suspect diminished solute intake based on history.  - Giving isotonic saline, recheck in AM  Stage III CKD: Creatinine at baseline on admission.  - Monitor BMP - Avoid nephrotoxins as able. Renally dose levaquin.  Carotid stenosis: Continue QOD ASA.  Hyperlipidemia: Continue statin  DVT prophylaxis:  Lovenox  Code Status: Full  Family Communication: None at bedside Disposition Plan: Home most likely, though uncertain. Pending clinical improvement. Consults called: None  Admission status: Observation    Tyrone Nine, MD [facility] [email or web address] Password [facility] 05/24/2018, 4:49 PM

## 2018-05-12 ENCOUNTER — Ambulatory Visit: Payer: Medicare Other | Admitting: Cardiovascular Disease

## 2018-05-12 DIAGNOSIS — I429 Cardiomyopathy, unspecified: Secondary | ICD-10-CM | POA: Diagnosis present

## 2018-05-12 DIAGNOSIS — E870 Hyperosmolality and hypernatremia: Secondary | ICD-10-CM | POA: Diagnosis not present

## 2018-05-12 DIAGNOSIS — E871 Hypo-osmolality and hyponatremia: Secondary | ICD-10-CM | POA: Diagnosis present

## 2018-05-12 DIAGNOSIS — D696 Thrombocytopenia, unspecified: Secondary | ICD-10-CM | POA: Diagnosis not present

## 2018-05-12 DIAGNOSIS — E878 Other disorders of electrolyte and fluid balance, not elsewhere classified: Secondary | ICD-10-CM | POA: Diagnosis present

## 2018-05-12 DIAGNOSIS — N179 Acute kidney failure, unspecified: Secondary | ICD-10-CM | POA: Diagnosis not present

## 2018-05-12 DIAGNOSIS — J189 Pneumonia, unspecified organism: Secondary | ICD-10-CM | POA: Diagnosis not present

## 2018-05-12 DIAGNOSIS — Z978 Presence of other specified devices: Secondary | ICD-10-CM | POA: Diagnosis not present

## 2018-05-12 DIAGNOSIS — D631 Anemia in chronic kidney disease: Secondary | ICD-10-CM | POA: Diagnosis present

## 2018-05-12 DIAGNOSIS — I129 Hypertensive chronic kidney disease with stage 1 through stage 4 chronic kidney disease, or unspecified chronic kidney disease: Secondary | ICD-10-CM | POA: Diagnosis present

## 2018-05-12 DIAGNOSIS — E46 Unspecified protein-calorie malnutrition: Secondary | ICD-10-CM | POA: Diagnosis present

## 2018-05-12 DIAGNOSIS — L89626 Pressure-induced deep tissue damage of left heel: Secondary | ICD-10-CM | POA: Diagnosis not present

## 2018-05-12 DIAGNOSIS — J159 Unspecified bacterial pneumonia: Secondary | ICD-10-CM | POA: Diagnosis present

## 2018-05-12 DIAGNOSIS — I6521 Occlusion and stenosis of right carotid artery: Secondary | ICD-10-CM | POA: Diagnosis present

## 2018-05-12 DIAGNOSIS — R0602 Shortness of breath: Secondary | ICD-10-CM | POA: Diagnosis present

## 2018-05-12 DIAGNOSIS — Z7189 Other specified counseling: Secondary | ICD-10-CM | POA: Diagnosis not present

## 2018-05-12 DIAGNOSIS — R944 Abnormal results of kidney function studies: Secondary | ICD-10-CM | POA: Diagnosis not present

## 2018-05-12 DIAGNOSIS — T501X5A Adverse effect of loop [high-ceiling] diuretics, initial encounter: Secondary | ICD-10-CM | POA: Diagnosis not present

## 2018-05-12 DIAGNOSIS — Z515 Encounter for palliative care: Secondary | ICD-10-CM | POA: Diagnosis present

## 2018-05-12 DIAGNOSIS — E875 Hyperkalemia: Secondary | ICD-10-CM | POA: Diagnosis not present

## 2018-05-12 DIAGNOSIS — R6520 Severe sepsis without septic shock: Secondary | ICD-10-CM | POA: Diagnosis present

## 2018-05-12 DIAGNOSIS — J9601 Acute respiratory failure with hypoxia: Secondary | ICD-10-CM | POA: Diagnosis present

## 2018-05-12 DIAGNOSIS — J181 Lobar pneumonia, unspecified organism: Secondary | ICD-10-CM | POA: Diagnosis not present

## 2018-05-12 DIAGNOSIS — J440 Chronic obstructive pulmonary disease with acute lower respiratory infection: Secondary | ICD-10-CM | POA: Diagnosis present

## 2018-05-12 DIAGNOSIS — J441 Chronic obstructive pulmonary disease with (acute) exacerbation: Secondary | ICD-10-CM | POA: Diagnosis present

## 2018-05-12 DIAGNOSIS — A419 Sepsis, unspecified organism: Secondary | ICD-10-CM | POA: Diagnosis present

## 2018-05-12 DIAGNOSIS — N183 Chronic kidney disease, stage 3 (moderate): Secondary | ICD-10-CM | POA: Diagnosis present

## 2018-05-12 DIAGNOSIS — G9341 Metabolic encephalopathy: Secondary | ICD-10-CM | POA: Diagnosis not present

## 2018-05-12 DIAGNOSIS — I10 Essential (primary) hypertension: Secondary | ICD-10-CM | POA: Diagnosis not present

## 2018-05-12 DIAGNOSIS — I471 Supraventricular tachycardia: Secondary | ICD-10-CM | POA: Diagnosis not present

## 2018-05-12 LAB — CBC
HEMATOCRIT: 30.1 % — AB (ref 36.0–46.0)
Hemoglobin: 9.5 g/dL — ABNORMAL LOW (ref 12.0–15.0)
MCH: 28.2 pg (ref 26.0–34.0)
MCHC: 31.6 g/dL (ref 30.0–36.0)
MCV: 89.3 fL (ref 80.0–100.0)
PLATELETS: 417 10*3/uL — AB (ref 150–400)
RBC: 3.37 MIL/uL — ABNORMAL LOW (ref 3.87–5.11)
RDW: 13.1 % (ref 11.5–15.5)
WBC: 9.9 10*3/uL (ref 4.0–10.5)
nRBC: 0 % (ref 0.0–0.2)

## 2018-05-12 LAB — BASIC METABOLIC PANEL
Anion gap: 7 (ref 5–15)
BUN: 9 mg/dL (ref 8–23)
CALCIUM: 7.8 mg/dL — AB (ref 8.9–10.3)
CHLORIDE: 104 mmol/L (ref 98–111)
CO2: 20 mmol/L — ABNORMAL LOW (ref 22–32)
CREATININE: 1 mg/dL (ref 0.44–1.00)
GFR calc Af Amer: >60 mL/min (ref >60)
GFR calc non Af Amer: 56 mL/min — ABNORMAL LOW (ref >60)
Glucose, Bld: 94 mg/dL (ref 70–99)
Potassium: 4.1 mmol/L (ref 3.5–5.1)
SODIUM: 131 mmol/L — AB (ref 135–145)

## 2018-05-12 LAB — STREP PNEUMONIAE URINARY ANTIGEN: Strep Pneumo Urinary Antigen: NEGATIVE

## 2018-05-12 LAB — URINE CULTURE: Culture: NO GROWTH

## 2018-05-12 LAB — PROCALCITONIN: Procalcitonin: 0.16 ng/mL

## 2018-05-12 NOTE — Progress Notes (Signed)
SATURATION QUALIFICATIONS: (This note is used to comply with regulatory documentation for home oxygen)  Patient Saturations on Room Air at Rest = 90%  Patient Saturations on Room Air while Ambulating = 86%  Patient Saturations on 2 Liters of oxygen while Ambulating = 92%  Please briefly explain why patient needs home oxygen: Pt will require home O2 due to desaturations on room air while ambulating.

## 2018-05-12 NOTE — Progress Notes (Addendum)
Orthostatic vitals obtained. Patient tolerated well.

## 2018-05-12 NOTE — Care Management Obs Status (Signed)
MEDICARE OBSERVATION STATUS NOTIFICATION   Patient Details  Name: Jasmine Black MRN: [id number] Date of Birth: 1951-02-02   Medicare Observation Status Notification Given:       [name], [name] 05/12/2018, 10:42 AM

## 2018-05-12 NOTE — Progress Notes (Signed)
PROGRESS NOTE  Jasmine RingsBrenda C Michael  WJX:[id number] DOB: 12/23/1950 DOA: 01/11/2018 PCP: Elfredia NevinsFusco, Lawrence, MD  Outpatient Specialists: Cardiology, Dr. Royann Shiversroitoru  Brief Narrative: Jasmine Black is a 67 y.o. 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 She had gone to her PCP a few days prior for a week of fevers, chills, increasing cough, and diagnosed with pneumonia, started on doxycycline and nebulized breathing treatments at home but has had no improvement. Over the past 24 hours she's grown increasingly weak, getting up out of bed rarely, much more short of breath when she attempts this and constantly feels like she's about to pass out. On initial evaluation she was afebrile, but was 100.28F at home prior to taking tylenol. Tachycardic with soft BP, hypoxic to 88% on room air, improved with 2L O2, tachypneic. WBC 11.3k, flu negative, lactic acid elevated. Levaquin and IV fluids given after blood cultures drawn.   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 - Continue supplemental oxygen again today. Will reattempt 11/16. - Treat pneumonia as below.  Sepsis due to multilobar pneumonia: Stable RUL, RLL and worsening LLL infiltrates on CXR despite sufficient trial of doxycycline as an outpatient. Flu negative. - Levaquin IV q24h again today, dose reduced to 500mg . PCT reassuring at 0.19 > 0.16.  - Monitor blood cultures, attempt sputum culture  HTN:  - Continue to hold lisinopril  PVCs: Troponin  negative, no ectopy on ECG. - Continue verapamil and continue telemetry. Not a significant PVC burden on my personal review of telemetry this morning.   Hypothyroidism:  - Continue home variable synthroid dosing. Ordered through 11/17, will need reordered if remains in the hospital at that time.   Thrombocytosis: Reactive, monitor, declining.   Hyponatremia, hypochloremia: Suspect diminished solute intake based on history.  - Continue isotonic saline, improving.  Stage III CKD: Creatinine at baseline on admission.  - Monitor BMP - Avoid nephrotoxins as able. Renally dosing levaquin.  Carotid stenosis: Continue QOD ASA.  Hyperlipidemia: Continue statin  Normocytic anemia: Presumably of chronic disease. Worse today without active bleeding.  - Recheck CBC in AM  DVT prophylaxis: Lovenox Code Status: Full Family Communication: Husband and son at bedside Disposition Plan: Home when safe for discharge, still has new oxygen requirement.  Consultants:   None  Procedures:   None  Antimicrobials:  Levaquin 11/14 >    Subjective: Still feels weak and short of breath with any exertion, not usual for her. She can usually walk >18600ft without dyspnea, but winded just getting to bathroom here. No chest pain or bleeding.  Objective: Vitals:   05/12/18 0755 05/12/18 0758 05/12/18 1200 05/12/18 1205  BP:      Pulse:  90    Resp:      Temp:      TempSrc:      SpO2: (!) 87% 91% (!) 86% 92%  Weight:      Height:        Intake/Output Summary (Last 24 hours) at 05/12/2018 1618 Last data filed at 05/12/2018 1614 Gross per 24 hour  Intake 2686.63 ml  Output 1750 ml  Net 936.63 ml   Filed Weights   05/05/2018 1128  Weight: 67.1 kg    Gen: 67 y.o. female in no distress  Pulm: Non-labored breathing 2L O2 at rest, crackles in midlung fields bilaterally.  CV: Regular rate and rhythm. No murmur, rub, or gallop. No JVD, no pedal edema. GI: Abdomen soft, non-tender,  non-distended, with normoactive bowel sounds. No organomegaly or masses felt. Ext: Warm, no deformities Skin: No rashes, lesions no ulcers Neuro: Alert and oriented. No focal neurological deficits. Psych: Judgement and insight appear normal. Mood & affect appropriate.   Data Reviewed: I have personally reviewed following labs and imaging studies  CBC: Recent Labs  Lab 05/09/2018 1152 05/12/18 0618  WBC 11.3* 9.9  NEUTROABS 8.4*  --   HGB 11.3* 9.5*  HCT 34.8* 30.1*  MCV 89.0 89.3  PLT 525* 417*   Basic Metabolic Panel: Recent Labs  Lab 05/20/2018 1152 05/12/18 0618  NA 129* 131*  K 3.6 4.1  CL 96* 104  CO2 21* 20*  GLUCOSE 149* 94  BUN 11 9  CREATININE 1.06* 1.00  CALCIUM 8.5* 7.8*   GFR: Estimated Creatinine Clearance: 45.3 mL/min (by C-G formula based on SCr of 1 mg/dL). Liver Function Tests: No results for input(s): AST, ALT, ALKPHOS, BILITOT, PROT, ALBUMIN in the last 168 hours. No results for input(s): LIPASE, AMYLASE in the last 168 hours. No results for input(s): AMMONIA in the last 168 hours. Coagulation Profile: No results for input(s): INR, PROTIME in the last 168 hours. Cardiac Enzymes: Recent Labs  Lab 05/13/2018 1152  TROPONINI <0.03   BNP (last 3 results) No results for input(s): PROBNP in the last 8760 hours. HbA1C: No results for input(s): HGBA1C in the last 72 hours. CBG: No results for input(s): GLUCAP in the last 168 hours. Lipid Profile: No results for input(s): CHOL, HDL, LDLCALC, TRIG, CHOLHDL, LDLDIRECT in the last 72 hours. Thyroid Function Tests: No results for input(s): TSH, T4TOTAL, FREET4, T3FREE, THYROIDAB in the last 72 hours. Anemia Panel: No results for input(s): VITAMINB12, FOLATE, FERRITIN, TIBC, IRON, RETICCTPCT in the last 72 hours. Urine analysis:    Component Value Date/Time   COLORURINE YELLOW 05/24/2018 1152   APPEARANCEUR CLEAR 05/07/2018 1152   LABSPEC 1.010 05/02/2018 1152   PHURINE 6.0 05/23/2018 1152   GLUCOSEU  NEGATIVE 05/16/2018 1152   HGBUR NEGATIVE 04/30/2018 1152   BILIRUBINUR NEGATIVE 05/10/2018 1152   KETONESUR NEGATIVE 04/28/2018 1152   PROTEINUR NEGATIVE 05/18/2018 1152   UROBILINOGEN 0.2 01/07/2010 1316   NITRITE NEGATIVE 05/27/2018 1152   LEUKOCYTESUR TRACE (A) 05/10/2018 1152   Recent Results (from the past 240 hour(s))  Urine culture     Status: None   Collection Time: 05/17/2018 11:52 AM  Result Value Ref Range Status   Specimen Description   Final    URINE, RANDOM Performed at [facility], [address] [place], [place] [address]    Special Requests   Final    NONE Performed at [facility], [address], [place], [place] [address]    Culture   Final    NO GROWTH Performed at [facility], [address] [place], [place] [address]    Report Status 05/12/2018 FINAL  Final  Culture, blood (routine x 2)     Status: None (Preliminary result)   Collection Time: 05/20/2018 12:38 PM  Result Value Ref Range Status   Specimen Description BLOOD RIGHT ARM  Final   Special  Requests   Final    BOTTLES DRAWN AEROBIC AND ANAEROBIC Blood Culture adequate volume   Culture   Final    NO GROWTH < 24 HOURS Performed at [facility], [address], [place], [place] [address]    Report Status PENDING  Incomplete  Culture, blood (routine x 2)     Status: None (Preliminary result)   Collection Time: 05/24/2018 12:38 PM  Result Value Ref Range Status   Specimen Description BLOOD RIGHT HAND  Final   Special Requests   Final    BOTTLES DRAWN AEROBIC AND ANAEROBIC Blood Culture adequate volume   Culture   Final    NO GROWTH < 24 HOURS Performed at [facility], [address] [place], [place] [address]    Report Status PENDING  Incomplete      Radiology Studies: Dg Chest 2 View  Result Date: 05/04/2018 CLINICAL DATA:  Recent pneumonia with persistent cough, fever, and shortness of breath. EXAM: CHEST - 2 VIEW COMPARISON:  May 08, 2018 FINDINGS: Extensive airspace disease is  noted throughout much of the right upper and lower lobe regions. There is also patchy infiltrate in the left lower lobe. The degree of consolidation on the right is stable. There is slight the increased consolidation in the left base. Heart size and pulmonary vascularity are normal. No adenopathy. No bone lesions. IMPRESSION: Multifocal pneumonia. Areas of airspace opacity in the right upper and lower lobes appears essentially stable compared to 3 days prior. Increase in infiltrate left base. Stable cardiac silhouette. No adenopathy evident. Electronically Signed   By: Bretta Bang III M.D.   On: 04/30/2018 12:57    Scheduled Meds: . aspirin  81 mg Oral QODAY  . enoxaparin (LOVENOX) injection  40 mg Subcutaneous Q24H  . pseudoephedrine  120 mg Oral Daily   And  . guaiFENesin  1,200 mg Oral Daily  . [START ON 05/13/2018] levothyroxine  50 mcg Oral Once per day on Sun Sat  . levothyroxine  75 mcg Oral Once per day on Mon Tue Wed Thu Fri  . loratadine  10 mg Oral Daily  . mouth rinse  15 mL Mouth Rinse BID  . pravastatin  40 mg Oral QPM  . sodium chloride flush  3 mL Intravenous Q12H  . verapamil  240 mg Oral QHS   Continuous Infusions: . sodium chloride 100 mL/hr at 05/12/18 1614  . levofloxacin (LEVAQUIN) IV       LOS: 0 days   Time spent: 25 minutes.  Tyrone Nine, MD [facility] [email or web address] Password [email or web address] 05/12/2018, 4:18 PM

## 2018-05-12 NOTE — Care Management (Signed)
Pt admitted with CAP. Pt from home with family, ind at baseline. Pt is on oxygen acutely and does not use neb machine pta. Pt will DC home over weekend with self care. May need home oxygen/neb machine. Pt has chosen [facility] from DME provider options. Referral sent but do not have signed order. If pt needs oxygen weekend CM will need to complete referral.

## 2018-05-12 NOTE — Care Management (Signed)
Patient Information   Patient Name Jasmine Black, Jasmine Black ([id number]) Sex Female DOB [birth year]  Room Bed  A318 A318-01  Patient Demographics   Address [address] [place] [address] Phone [phone] (Home)  Patient Ethnicity & Race   Ethnic Group Patient Race  Not Hispanic or Latino White or Caucasian  Emergency Contact(s)   Name Relation Home Work Mobile  Aung,Ronald Spouse [phone]    Burnard Hawthorne [phone]    Documents on File    Status Date Received Description  Documents for the Patient  EMR Patient Summary Not Received    [place] HIPAA NOTICE OF PRIVACY - Scanned Received 08/06/10   [place] E-Signature HIPAA Notice of Privacy Received 12/16/11   [place] E-Signature HIPAA Notice of Privacy Spanish Not Received    Driver's License Not Received    Advance Directives/Living Will/HCPOA/POA Not Received    EMR Patient Summary Not Received    Driver's License Not Received    Financial Application Not Received    VVS Policy for Pain - E Signature Not Received    Insurance Card Received 04/28/12   Insurance Card Not Received  BCBS 2014  HIM ROI Authorization (Expired) 01/10/13 Faxed most recent lab to Dr. Sherwood Gambler. ST  Insurance Card Received 11/09/13 bcbs 2014  Other Photo ID Not Received    Insurance Card Received 12/27/14 BCBS 2015  AMB Correspondence  09/17/15   AMB Correspondence  09/20/15   Insurance Card Received 04/22/16 BCBS 2017  Insurance Card Received 03/27/18 [place]/BCBS/New Medicare 2019  Insurance Card Received 05/05/17 [facility] Medicare 2018  Insurance Card Received 05/05/17 Medicare 2018 NEW  Release of Information Received 05/05/17 [facility] 05-05-2017  Advanced Beneficiary Notice (ABN) Not Received    E-Signature AOB Spanish Not Received    Advanced Beneficiary Notice (ABN)   Medicare [place] 03/27/2018~DR(Ft)  Driver's License (Deleted) 12/22/09   Insurance Card Received (Deleted) 11/03/10   Insurance Card Not  Received (Deleted)    Insurance Card Not Received (Deleted)    AMB Correspondence (Deleted) 05/26/12 11/13 Med Hx VVS  Documents for the Encounter  AOB (Assignment of Insurance Benefits) Not Received    E-signature AOB Signed 05/21/2018   MEDICARE RIGHTS Not Received    E-signature Medicare Rights Signed 05/16/2018   Cardiac Monitoring Strip Shift Summary Received 05/19/2018   Medicare Observation  05/12/18   EKG Received 05/12/18   Admission Information   Attending Provider Admitting Provider Admission Type Admission Date/Time  Tyrone Nine, MD Tyrone Nine, MD Emergency 05/20/2018 1131  Discharge Date Hospital Service Auth/Cert Status Service Area   Internal Medicine Incomplete [facility]  Unit Room/Bed Admission Status   AP-DEPT 300 A318/A318-01 Admission (Confirmed)   Admission   Complaint  Pneumonia  Hospital Account   Name Acct ID Class Status Primary Coverage  Jasmine Black, Jasmine Black [id number] Observation Open HEALTH[employer] ADVANTAGE - HEALTHTEAM ADVANTAGE      Guarantor Account (for Hospital Account [id number])   Name Relation to Pt Service Area Active? Acct Type  Jasmine Black Self [facility] Yes Personal/Family  Address Phone    [address] [place], [place] [address] [phone](H)        Coverage Information (for Hospital Account [id number])   1. HEALTHTEAM ADVANTAGE/HEALTHTEAM ADVANTAGE   F/O Payor/Plan Precert #  HEALTHTEAM ADVANTAGE/HEALTHTEAM ADVANTAGE   Subscriber Subscriber #  Jasmine Black, Jasmine Black [id number]  Address Phone  [facility] DEPARTMENT [address] [address] [place], [place] [address] [phone]  2. BLUE CROSS BLUE SHIELD/BCBS STATE HEALTH  PPO   F/O Payor/Plan Precert #  BL[facility] CROSS [facility]BCBS STATE HEALTH PPO   Subscriber Subscriber #  Jasmine Black, Jasmine Black [id number]  Address Phone  [address] [address] [place], [place] [phone]

## 2018-05-12 NOTE — Care Management Obs Status (Signed)
MEDICARE OBSERVATION STATUS NOTIFICATION   Patient Details  Name: Jasmine Black MRN: [id number] Date of Birth: 01-13-1951   Medicare Observation Status Notification Given:  Yes    Malcolm MetroChildress, Mikaia Janvier Demske, RN 05/12/2018, 11:34 AM

## 2018-05-13 LAB — BASIC METABOLIC PANEL
ANION GAP: 6 (ref 5–15)
BUN: 7 mg/dL — AB (ref 8–23)
CHLORIDE: 106 mmol/L (ref 98–111)
CO2: 20 mmol/L — ABNORMAL LOW (ref 22–32)
Calcium: 7.4 mg/dL — ABNORMAL LOW (ref 8.9–10.3)
Creatinine, Ser: 0.86 mg/dL (ref 0.44–1.00)
GFR calc Af Amer: >60 mL/min (ref >60)
GLUCOSE: 96 mg/dL (ref 70–99)
POTASSIUM: 3.9 mmol/L (ref 3.5–5.1)
Sodium: 132 mmol/L — ABNORMAL LOW (ref 135–145)

## 2018-05-13 LAB — CBC
HEMATOCRIT: 29.9 % — AB (ref 36.0–46.0)
HEMOGLOBIN: 9.6 g/dL — AB (ref 12.0–15.0)
MCH: 29.4 pg (ref 26.0–34.0)
MCHC: 32.1 g/dL (ref 30.0–36.0)
MCV: 91.4 fL (ref 80.0–100.0)
Platelets: 432 10*3/uL — ABNORMAL HIGH (ref 150–400)
RBC: 3.27 MIL/uL — AB (ref 3.87–5.11)
RDW: 13.1 % (ref 11.5–15.5)
WBC: 11.8 10*3/uL — AB (ref 4.0–10.5)
nRBC: 0 % (ref 0.0–0.2)

## 2018-05-13 LAB — PROCALCITONIN: PROCALCITONIN: 0.13 ng/mL

## 2018-05-13 LAB — HIV ANTIBODY (ROUTINE TESTING W REFLEX): HIV SCREEN 4TH GENERATION: NONREACTIVE

## 2018-05-13 MED ORDER — METHYLPREDNISOLONE SODIUM SUCC 40 MG IJ SOLR
40.0000 mg | Freq: Four times a day (QID) | INTRAMUSCULAR | Status: AC
  Administered 2018-05-13 – 2018-05-14 (×3): 40 mg via INTRAVENOUS
  Filled 2018-05-13 (×3): qty 1

## 2018-05-13 MED ORDER — ALBUTEROL SULFATE (2.5 MG/3ML) 0.083% IN NEBU
2.5000 mg | INHALATION_SOLUTION | RESPIRATORY_TRACT | Status: DC | PRN
  Administered 2018-05-13 – 2018-05-14 (×4): 2.5 mg via RESPIRATORY_TRACT
  Filled 2018-05-13 (×5): qty 3

## 2018-05-13 MED ORDER — METHYLPREDNISOLONE SODIUM SUCC 125 MG IJ SOLR
125.0000 mg | Freq: Once | INTRAMUSCULAR | Status: AC
  Administered 2018-05-13: 125 mg via INTRAVENOUS
  Filled 2018-05-13: qty 2

## 2018-05-13 NOTE — Progress Notes (Signed)
PROGRESS NOTE  Jasmine Black  NUU:[id number] DOB: Jun 13, 1951 DOA: 2018/05/13 PCP: Elfredia Nevins, MD  Outpatient Specialists: Cardiology, Dr. Royann Shivers  Brief Narrative: Jasmine Black is a 67 y.o. 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 She had gone to her PCP a few days prior for a week of fevers, chills, increasing cough, and diagnosed with pneumonia, started on doxycycline and nebulized breathing treatments at home but has had no improvement. Over the past 24 hours she's grown increasingly weak, getting up out of bed rarely, much more short of breath when she attempts this and constantly feels like she's about to pass out. On initial evaluation she was afebrile, but was 100.34F at home prior to taking tylenol. Tachycardic with soft BP, hypoxic to 88% on room air, improved with 2L O2, tachypneic. WBC 11.3k, flu negative, lactic acid elevated. Levaquin and IV fluids given after blood cultures drawn.   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 Due to bacterial pneumonia and possibly acute bronchitis now that she's developing wheezing. - Continue supplemental oxygen again - Treat pneumonia as below. - Start IV steroids and prn breathing treatments.  Sepsis due to multilobar pneumonia: Stable RUL, RLL and worsening LLL infiltrates on CXR despite sufficient trial of doxycycline as an outpatient. Flu negative. - Levaquin IV q24h again today, dose reduced to 500mg . PCT reassuring at 0.19 > 0.16 >  0.13. - Monitor blood cultures, NG2D - Sputum culture pending  HTN:  - Continue to hold lisinopril  PVCs: Troponin negative, no ectopy on ECG. - Continue verapamil and continue telemetry. Rare PVCs on telemetry, will transfer to Med-Surg.   Hypothyroidism:  - Continue home variable synthroid dosing. Ordered.  Thrombocytosis: Reactive, monitor.  Hyponatremia, hypochloremia: Suspect diminished solute intake based on history.  - Continue isotonic saline, improving slowly  Stage III CKD: Creatinine at baseline on admission.  - Monitor BMP intermittently - Avoid nephrotoxins as able. Renally dosing levaquin.  Carotid stenosis: Continue QOD ASA.  Hyperlipidemia: Continue statin  Normocytic anemia: Presumably of chronic disease. Worse today without active bleeding. Hgb stable on recheck, no bleeding.  - Recheck CBC intermittently  DVT prophylaxis: Lovenox Code Status: Full Family Communication: Husband at bedside Disposition Plan: Home when safe for discharge, still has new oxygen requirement, and now wheezing.  Consultants:   None  Procedures:   None  Antimicrobials:  Levaquin 11/14 >    Subjective: Had a hard night with increase in coughing, hacking, now wheezing this morning. Has no hx COPD but has had bronchitis in the past. Granddaughter is developing similar symptoms.  Objective: Vitals:   05/12/18 2152 05/13/18 0534 05/13/18 1128 05/13/18 1312  BP: 139/67 (!) 122/53  (!) 116/58  Pulse: (!) 115 98  (!) 106  Resp: 20 20  20   Temp: 98.1 F (36.7 C) 98.5 F (36.9 C)  98.6 F (37 C)  TempSrc: Oral Oral  Oral  SpO2: (!) 89% 92% 94% (!) 89%  Weight:      Height:        Intake/Output Summary (Last  24 hours) at 05/13/2018 1435 Last data filed at 05/13/2018 1329 Gross per 24 hour  Intake 2708.38 ml  Output 2875 ml  Net -166.62 ml   Filed Weights   05/12/2018 1128  Weight: 67.1 kg   Gen: 67 y.o. female in no distress Pulm: Nonlabored on 2 LPM,  bilateral crackles stable/improved in midlung zones, new wheezing diffusely expiratory bilaterally, still good air movement. CV: Regular rate and rhythm. No murmur, rub, or gallop. No JVD, no dependent edema. GI: Abdomen soft, non-tender, non-distended, with normoactive bowel sounds.  Ext: Warm, no deformities Skin: No rashes, lesions or ulcers on visualized skin.  Neuro: Alert and oriented. No focal neurological deficits. Psych: Judgement and insight appear fair. Mood euthymic & affect congruent. Behavior is appropriate.    Telemetry: Personally reviewed, rare PACs and PVCs, no NSVT.  Data Reviewed: I have personally reviewed following labs and imaging studies  CBC: Recent Labs  Lab 05/17/2018 1152 05/12/18 0618 05/13/18 0633  WBC 11.3* 9.9 11.8*  NEUTROABS 8.4*  --   --   HGB 11.3* 9.5* 9.6*  HCT 34.8* 30.1* 29.9*  MCV 89.0 89.3 91.4  PLT 525* 417* 432*   Basic Metabolic Panel: Recent Labs  Lab 05/09/2018 1152 05/12/18 0618 05/13/18 0633  NA 129* 131* 132*  K 3.6 4.1 3.9  CL 96* 104 106  CO2 21* 20* 20*  GLUCOSE 149* 94 96  BUN 11 9 7*  CREATININE 1.06* 1.00 0.86  CALCIUM 8.5* 7.8* 7.4*   GFR: Estimated Creatinine Clearance: 52.7 mL/min (by C-G formula based on SCr of 0.86 mg/dL). Liver Function Tests: No results for input(s): AST, ALT, ALKPHOS, BILITOT, PROT, ALBUMIN in the last 168 hours. No results for input(s): LIPASE, AMYLASE in the last 168 hours. No results for input(s): AMMONIA in the last 168 hours. Coagulation Profile: No results for input(s): INR, PROTIME in the last 168 hours. Cardiac Enzymes: Recent Labs  Lab 05/09/2018 1152  TROPONINI <0.03   BNP (last 3 results) No results for input(s): PROBNP in the last 8760 hours. HbA1C: No results for input(s): HGBA1C in the last 72 hours. CBG: No results for input(s): GLUCAP in the last 168 hours. Lipid Profile: No results for input(s): CHOL, HDL, LDLCALC, TRIG, CHOLHDL, LDLDIRECT in the last 72  hours. Thyroid Function Tests: No results for input(s): TSH, T4TOTAL, FREET4, T3FREE, THYROIDAB in the last 72 hours. Anemia Panel: No results for input(s): VITAMINB12, FOLATE, FERRITIN, TIBC, IRON, RETICCTPCT in the last 72 hours. Urine analysis:    Component Value Date/Time   COLORURINE YELLOW 05/14/2018 1152   APPEARANCEUR CLEAR 05/24/2018 1152   LABSPEC 1.010 05/24/2018 1152   PHURINE 6.0 05/06/2018 1152   GLUCOSEU NEGATIVE 04/30/2018 1152   HGBUR NEGATIVE 05/26/2018 1152   BILIRUBINUR NEGATIVE 05/12/2018 1152   KETONESUR NEGATIVE 05/07/2018 1152   PROTEINUR NEGATIVE 05/26/2018 1152   UROBILINOGEN 0.2 01/07/2010 1316   NITRITE NEGATIVE 05/15/2018 1152   LEUKOCYTESUR TRACE (A) 05/24/2018 1152   Recent Results (from the past 240 hour(s))  Urine culture     Status: None   Collection Time: 05/27/2018 11:52 AM  Result Value Ref Range Status   Specimen Description   Final    URINE, RANDOM Performed at [facility], [address] [place], [place] [address]    Special Requests   Final    NONE Performed at [facility], [address] [place], [place] [address]    Culture   Final    NO GROWTH Performed at [facility], [address], [place], [place] [address]    Report Status 05/12/2018 FINAL  Final  Culture, blood (routine x 2)     Status: None (Preliminary result)   Collection Time: 05/10/2018 12:38 PM  Result Value Ref Range Status   Specimen Description BLOOD RIGHT ARM  Final   Special Requests   Final    BOTTLES DRAWN AEROBIC AND ANAEROBIC Blood Culture adequate volume   Culture   Final    NO GROWTH 2 DAYS Performed at [facility], [address], [place], [place] [address]    Report Status PENDING  Incomplete  Culture, blood (routine x 2)     Status: None (Preliminary result)   Collection Time: 05/22/2018 12:38 PM  Result Value Ref Range Status   Specimen Description BLOOD RIGHT HAND  Final   Special Requests   Final    BOTTLES DRAWN AEROBIC AND ANAEROBIC  Blood Culture adequate volume   Culture   Final    NO GROWTH 2 DAYS Performed at [facility], [address] [place], [place] [address]    Report Status PENDING  Incomplete      Radiology Studies: No results found.  Scheduled Meds: . aspirin  81 mg Oral QODAY  . enoxaparin (LOVENOX) injection  40 mg Subcutaneous Q24H  . pseudoephedrine  120 mg Oral Daily   And  . guaiFENesin  1,200 mg Oral Daily  . levothyroxine  50 mcg Oral Once per day on Sun Sat  . levothyroxine  75 mcg Oral Once per day on Mon Tue Wed Thu Fri  . loratadine  10 mg Oral Daily  . mouth rinse  15 mL Mouth Rinse BID  . methylPREDNISolone (SOLU-MEDROL) injection  40 mg Intravenous Q6H  . pravastatin  40 mg Oral QPM  . sodium chloride flush  3 mL Intravenous Q12H  . verapamil  240 mg Oral QHS   Continuous Infusions: . sodium chloride 100 mL/hr at 05/13/18 1058  . levofloxacin (LEVAQUIN) IV Stopped (05/12/18 1928)     LOS: 1 day   Time spent: 25 minutes.  Tyrone Nine, MD [facility] [email or web address] Password [email or web address] 05/13/2018, 2:35 PM

## 2018-05-13 NOTE — Progress Notes (Signed)
Patient declined nebulizer treatment at this time stating" I want to finish eating first."  A therapist will return later to see if she  Is need of a treatment.

## 2018-05-14 ENCOUNTER — Encounter (HOSPITAL_COMMUNITY): Payer: Self-pay

## 2018-05-14 ENCOUNTER — Inpatient Hospital Stay (HOSPITAL_COMMUNITY): Payer: PPO

## 2018-05-14 LAB — D-DIMER, QUANTITATIVE (NOT AT ARMC): D DIMER QUANT: 2.95 ug{FEU}/mL — AB (ref 0.00–0.50)

## 2018-05-14 MED ORDER — IPRATROPIUM-ALBUTEROL 0.5-2.5 (3) MG/3ML IN SOLN
3.0000 mL | Freq: Four times a day (QID) | RESPIRATORY_TRACT | Status: DC
  Administered 2018-05-15 – 2018-05-18 (×14): 3 mL via RESPIRATORY_TRACT
  Filled 2018-05-14 (×15): qty 3

## 2018-05-14 MED ORDER — METHYLPREDNISOLONE SODIUM SUCC 40 MG IJ SOLR
40.0000 mg | Freq: Once | INTRAMUSCULAR | Status: AC
  Administered 2018-05-14: 40 mg via INTRAVENOUS
  Filled 2018-05-14: qty 1

## 2018-05-14 MED ORDER — PREDNISONE 20 MG PO TABS
40.0000 mg | ORAL_TABLET | Freq: Every day | ORAL | Status: DC
  Administered 2018-05-15: 40 mg via ORAL
  Filled 2018-05-14: qty 2

## 2018-05-14 MED ORDER — LEVOFLOXACIN 500 MG PO TABS
500.0000 mg | ORAL_TABLET | Freq: Every day | ORAL | Status: DC
  Administered 2018-05-14: 500 mg via ORAL
  Filled 2018-05-14: qty 1

## 2018-05-14 MED ORDER — IOPAMIDOL (ISOVUE-370) INJECTION 76%
75.0000 mL | Freq: Once | INTRAVENOUS | Status: AC | PRN
  Administered 2018-05-14: 75 mL via INTRAVENOUS

## 2018-05-14 NOTE — Progress Notes (Signed)
Patient has been on 2L of oxygen and complaining of shortness of breath. Patient O2 Sat was 80%. Respiratory was paged and breathing treatment was administered. Respiratory increased oxygen to 4L and doctor was paged and made aware. Patient denies any chest pain. Patient is resting in the bed now.

## 2018-05-14 NOTE — Progress Notes (Signed)
SATURATION QUALIFICATIONS: (This note is used to comply with regulatory documentation for home oxygen)  Patient Saturations on Room Air at Rest = 71%  Patient Saturations on Room Air while Ambulating = 53%  Patient Saturations on 4 Liters of oxygen while Ambulating = 76%  Please briefly explain why patient needs home oxygen: Patient O2 Sats drop significantly when ambulating and while at rest without oxygen.

## 2018-05-14 NOTE — Progress Notes (Signed)
Checked patient oxygen and still 83 on 4 lpm. Patient given nebulizer placed on high flow at 8 lpm. Also started on Incentive. Fluids are showing 3089 positive, though cat scan from today showed only pleural effusion and possible worsening pneumonia.

## 2018-05-14 NOTE — Progress Notes (Signed)
Patient currently sating 91% on 8 LFH.  Notified on-call MD of change.  Patient nonsymptomatic.  Will continue to monitor.

## 2018-05-14 NOTE — Progress Notes (Signed)
PROGRESS NOTE  Jasmine Black  JYN:[id number] DOB: [birth year] DOA: 06-02-2018 PCP: Elfredia Nevins, MD  Outpatient Specialists: Cardiology, Dr. Royann Shivers  Brief Narrative: Jasmine Black is a 67 y.o. 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 She had gone to her PCP a few days prior for a week of fevers, chills, increasing cough, and diagnosed with pneumonia, started on doxycycline and nebulized breathing treatments at home but has had no improvement. Over the past 24 hours she's grown increasingly weak, getting up out of bed rarely, much more short of breath when she attempts this and constantly feels like she's about to pass out. On initial evaluation she was afebrile, but was 100.75F at home prior to taking tylenol. Tachycardic with soft BP, hypoxic to 88% on room air, improved with 2L O2, tachypneic. WBC 11.3k, flu negative, lactic acid elevated. Levaquin and IV fluids given after blood cultures drawn. She continued to be hypoxic and developed wheezing for which steroids and breathing treatments were provided. There has been some improvement since that time, though she remains in respiratory compromise where she never has shortness of breath prior to this episode.  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 Due to bacterial pneumonia and possibly acute bronchitis now that she's developing wheezing. - Continue supplemental oxygen, ambulate qShift and check pulse oximetry.  - Treat pneumonia as  below. - Continue prn bronchodilators. Steroids seem to have squelched wheezing, will continue IV today, deescalate to po in next 24 hours.   Sepsis due to multilobar pneumonia: Stable RUL, RLL and worsening LLL infiltrates on CXR despite sufficient trial of doxycycline as an outpatient. Flu negative. - Continue levaquin, transition to po. PCT reassuring at 0.19 > 0.16 > 0.13. - Monitor blood cultures, NGTD - Sputum culture pending  HTN: Currently soft blood pressures.  - Continue to hold lisinopril  PVCs: Troponin negative, no ectopy on ECG. - Continue verapamil. Rare PVCs on telemetry so this was discontinued.  Hypothyroidism:  - Continue home variable synthroid dosing. Ordered.  Thrombocytosis: Reactive, monitor.  Hyponatremia, hypochloremia: Suspect diminished solute intake based on history.  - Continue isotonic saline, improving slowly  Stage III CKD: Creatinine at baseline on admission.  - Monitor BMP intermittently - Avoid nephrotoxins as able. Renally dosing levaquin.  Carotid stenosis: Continue QOD ASA.  Hyperlipidemia: Continue statin  Normocytic anemia: Presumably of chronic disease. Worse today without active bleeding. Hgb stable on recheck, no bleeding.  - Recheck CBC intermittently  DVT prophylaxis: Lovenox Code Status: Full Family Communication: Husband and sister at bedside Disposition Plan: DC home if improving over next 24 hours. Has had set backs requiring IV steroids and breathing treatments (new to pt) as well as ongoing hypoxia.   Consultants:   None  Procedures:   None  Antimicrobials:  Levaquin 11/14 >    Subjective: Had wheezing improve overnight and feels better now, but has not gotten up. Still coughing, hypoxic overnight but RN is attempting to wean oxygen today. Family at bedside wishes for her to walk more. Noticing swelling of  her feet starting yesterday afternoon/evening. Eating well.  Objective: Vitals:   05/13/18 2102  05/14/18 0242 05/14/18 0517 05/14/18 0757  BP: 132/63  (!) 97/48   Pulse: (!) 103  89 86  Resp: 20  20   Temp: 98.2 F (36.8 C)  (!) 97.4 F (36.3 C)   TempSrc: Oral  Oral   SpO2: 91% (!) 88% 91% (!) 86%  Weight: 47.9 kg     Height:        Intake/Output Summary (Last 24 hours) at 05/14/2018 1324 Last data filed at 05/14/2018 1231 Gross per 24 hour  Intake 3321.3 ml  Output 2326 ml  Net 995.3 ml   Filed Weights   05/20/2018 1128 05/13/18 2102  Weight: 67.1 kg 47.9 kg   Gen: 67 y.o. female in no distress Pulm: Nonlabored tachypnea without accessory muscle use with supplemental oxygen. Much more clear today, very scant wheezing noted without crackles/rhonchi.  CV: Regular rate and rhythm. No murmur, rub, or gallop. No JVD, trace pedal nonpitting dependent edema. GI: Abdomen soft, non-tender, non-distended, with normoactive bowel sounds.  Ext: Warm, no deformities Skin: No rashes, lesions or ulcers on visualized skin.  Neuro: Alert and oriented. No focal neurological deficits. Psych: Judgement and insight appear fair. Mood euthymic & affect congruent. Behavior is appropriate.    Data Reviewed: I have personally reviewed following labs and imaging studies  CBC: Recent Labs  Lab 05/17/2018 1152 05/12/18 0618 05/13/18 0633  WBC 11.3* 9.9 11.8*  NEUTROABS 8.4*  --   --   HGB 11.3* 9.5* 9.6*  HCT 34.8* 30.1* 29.9*  MCV 89.0 89.3 91.4  PLT 525* 417* 432*   Basic Metabolic Panel: Recent Labs  Lab 04/29/2018 1152 05/12/18 0618 05/13/18 0633  NA 129* 131* 132*  K 3.6 4.1 3.9  CL 96* 104 106  CO2 21* 20* 20*  GLUCOSE 149* 94 96  BUN 11 9 7*  CREATININE 1.06* 1.00 0.86  CALCIUM 8.5* 7.8* 7.4*   GFR: Estimated Creatinine Clearance: 44.3 mL/min (by C-G formula based on SCr of 0.86 mg/dL). Liver Function Tests: No results for input(s): AST, ALT, ALKPHOS, BILITOT, PROT, ALBUMIN in the last 168 hours. No results for input(s): LIPASE, AMYLASE in the last 168 hours. No results  for input(s): AMMONIA in the last 168 hours. Coagulation Profile: No results for input(s): INR, PROTIME in the last 168 hours. Cardiac Enzymes: Recent Labs  Lab 05/07/2018 1152  TROPONINI <0.03   BNP (last 3 results) No results for input(s): PROBNP in the last 8760 hours. HbA1C: No results for input(s): HGBA1C in the last 72 hours. CBG: No results for input(s): GLUCAP in the last 168 hours. Lipid Profile: No results for input(s): CHOL, HDL, LDLCALC, TRIG, CHOLHDL, LDLDIRECT in the last 72 hours. Thyroid Function Tests: No results for input(s): TSH, T4TOTAL, FREET4, T3FREE, THYROIDAB in the last 72 hours. Anemia Panel: No results for input(s): VITAMINB12, FOLATE, FERRITIN, TIBC, IRON, RETICCTPCT in the last 72 hours. Urine analysis:    Component Value Date/Time   COLORURINE YELLOW 05/16/2018 1152   APPEARANCEUR CLEAR 05/27/2018 1152   LABSPEC 1.010 05/13/2018 1152   PHURINE 6.0 05/06/2018 1152   GLUCOSEU NEGATIVE 04/29/2018 1152   HGBUR NEGATIVE 05/26/2018 1152   BILIRUBINUR NEGATIVE 05/14/2018 1152   KETONESUR NEGATIVE 05/18/2018 1152   PROTEINUR NEGATIVE 04/30/2018 1152   UROBILINOGEN 0.2 01/07/2010 1316   NITRITE NEGATIVE 05/06/2018 1152   LEUKOCYTESUR TRACE (A) 05/22/2018 1152   Recent Results (from the past 240 hour(s))  Urine  culture     Status: None   Collection Time: May 26, 2018 11:52 AM  Result Value Ref Range Status   Specimen Description   Final    URINE, RANDOM Performed at [facility], [address], [place], [place] [address]    Special Requests   Final    NONE Performed at [facility], [address] [place], [place] [address]    Culture   Final    NO GROWTH Performed at [facility], [address], [place], [place] [address]    Report Status 05/12/2018 FINAL  Final  Culture, blood (routine x 2)     Status: None (Preliminary result)   Collection Time: 05/26/2018 12:38 PM  Result Value Ref Range Status   Specimen Description BLOOD RIGHT ARM   Final   Special Requests   Final    BOTTLES DRAWN AEROBIC AND ANAEROBIC Blood Culture adequate volume   Culture   Final    NO GROWTH 3 DAYS Performed at [facility], [address], [place], [place] [address]    Report Status PENDING  Incomplete  Culture, blood (routine x 2)     Status: None (Preliminary result)   Collection Time: 05/26/2018 12:38 PM  Result Value Ref Range Status   Specimen Description BLOOD RIGHT HAND  Final   Special Requests   Final    BOTTLES DRAWN AEROBIC AND ANAEROBIC Blood Culture adequate volume   Culture   Final    NO GROWTH 3 DAYS Performed at [facility], [address] [place], [place] [address]    Report Status PENDING  Incomplete      Radiology Studies: No results found.  Scheduled Meds: . aspirin  81 mg Oral QODAY  . enoxaparin (LOVENOX) injection  40 mg Subcutaneous Q24H  . pseudoephedrine  120 mg Oral Daily   And  . guaiFENesin  1,200 mg Oral Daily  . levothyroxine  75 mcg Oral Once per day on Mon Tue Wed Thu Fri  . loratadine  10 mg Oral Daily  . mouth rinse  15 mL Mouth Rinse BID  . pravastatin  40 mg Oral QPM  . sodium chloride flush  3 mL Intravenous Q12H  . verapamil  240 mg Oral QHS   Continuous Infusions: . sodium chloride 100 mL/hr at 05/14/18 0605  . levofloxacin (LEVAQUIN) IV Stopped (05/13/18 1832)     LOS: 2 days   Time spent: 25 minutes.  Tyrone Nine, MD [facility] [email or web address] Password [email or web address] 05/14/2018, 1:24 PM

## 2018-05-15 ENCOUNTER — Inpatient Hospital Stay (HOSPITAL_COMMUNITY): Payer: PPO

## 2018-05-15 DIAGNOSIS — R0602 Shortness of breath: Secondary | ICD-10-CM

## 2018-05-15 LAB — CBC
HEMATOCRIT: 30.4 % — AB (ref 36.0–46.0)
HEMOGLOBIN: 9.6 g/dL — AB (ref 12.0–15.0)
MCH: 29.3 pg (ref 26.0–34.0)
MCHC: 31.6 g/dL (ref 30.0–36.0)
MCV: 92.7 fL (ref 80.0–100.0)
Platelets: 576 10*3/uL — ABNORMAL HIGH (ref 150–400)
RBC: 3.28 MIL/uL — AB (ref 3.87–5.11)
RDW: 13.7 % (ref 11.5–15.5)
WBC: 26.4 10*3/uL — AB (ref 4.0–10.5)
nRBC: 0 % (ref 0.0–0.2)

## 2018-05-15 LAB — COMPREHENSIVE METABOLIC PANEL
ALT: 16 U/L (ref 0–44)
AST: 30 U/L (ref 15–41)
Albumin: 2 g/dL — ABNORMAL LOW (ref 3.5–5.0)
Alkaline Phosphatase: 73 U/L (ref 38–126)
Anion gap: 9 (ref 5–15)
BUN: 11 mg/dL (ref 8–23)
CALCIUM: 8.2 mg/dL — AB (ref 8.9–10.3)
CHLORIDE: 107 mmol/L (ref 98–111)
CO2: 19 mmol/L — ABNORMAL LOW (ref 22–32)
CREATININE: 0.94 mg/dL (ref 0.44–1.00)
Glucose, Bld: 124 mg/dL — ABNORMAL HIGH (ref 70–99)
Potassium: 4.1 mmol/L (ref 3.5–5.1)
Sodium: 135 mmol/L (ref 135–145)
Total Bilirubin: 0.4 mg/dL (ref 0.3–1.2)
Total Protein: 6.2 g/dL — ABNORMAL LOW (ref 6.5–8.1)

## 2018-05-15 LAB — BLOOD GAS, ARTERIAL
ACID-BASE DEFICIT: 2.9 mmol/L — AB (ref 0.0–2.0)
ACID-BASE DEFICIT: 2.4 mmol/L — AB (ref 0.0–2.0)
Bicarbonate: 22.1 mmol/L (ref 20.0–28.0)
Bicarbonate: 22.6 mmol/L (ref 20.0–28.0)
DRAWN BY: 28459
DRAWN BY: 21310
Delivery systems: POSITIVE
EXPIRATORY PAP: 5
FIO2: 100
FIO2: 100
Inspiratory PAP: 10
O2 SAT: 93.9 %
O2 Saturation: 89.8 %
PATIENT TEMPERATURE: 37
PCO2 ART: 33.8 mmHg (ref 32.0–48.0)
PH ART: 7.41 (ref 7.350–7.450)
PH ART: 7.411 (ref 7.350–7.450)
PO2 ART: 73.7 mmHg — AB (ref 83.0–108.0)
Patient temperature: 37
pCO2 arterial: 34.5 mmHg (ref 32.0–48.0)
pO2, Arterial: 60.3 mmHg — ABNORMAL LOW (ref 83.0–108.0)

## 2018-05-15 LAB — BRAIN NATRIURETIC PEPTIDE: B NATRIURETIC PEPTIDE 5: 246 pg/mL — AB (ref 0.0–100.0)

## 2018-05-15 LAB — ECHOCARDIOGRAM COMPLETE
Height: 60 in
Weight: 2649.05 oz

## 2018-05-15 LAB — MRSA PCR SCREENING: MRSA BY PCR: NEGATIVE

## 2018-05-15 LAB — PROCALCITONIN: Procalcitonin: <0.1 ng/mL

## 2018-05-15 MED ORDER — SODIUM CHLORIDE 0.9 % IV SOLN
INTRAVENOUS | Status: DC | PRN
  Administered 2018-05-15 – 2018-05-20 (×3): 500 mL via INTRAVENOUS

## 2018-05-15 MED ORDER — VANCOMYCIN HCL IN DEXTROSE 1-5 GM/200ML-% IV SOLN
1000.0000 mg | INTRAVENOUS | Status: DC
  Filled 2018-05-15: qty 200

## 2018-05-15 MED ORDER — LEVOFLOXACIN IN D5W 750 MG/150ML IV SOLN
750.0000 mg | INTRAVENOUS | Status: DC
  Administered 2018-05-15 – 2018-05-19 (×3): 750 mg via INTRAVENOUS
  Filled 2018-05-15 (×4): qty 150

## 2018-05-15 MED ORDER — VANCOMYCIN HCL IN DEXTROSE 1-5 GM/200ML-% IV SOLN
1000.0000 mg | Freq: Once | INTRAVENOUS | Status: AC
  Administered 2018-05-15: 1000 mg via INTRAVENOUS

## 2018-05-15 MED ORDER — FUROSEMIDE 10 MG/ML IJ SOLN
20.0000 mg | Freq: Once | INTRAMUSCULAR | Status: AC
  Administered 2018-05-15: 20 mg via INTRAVENOUS
  Filled 2018-05-15: qty 2

## 2018-05-15 MED ORDER — LORAZEPAM 2 MG/ML IJ SOLN
0.5000 mg | INTRAMUSCULAR | Status: DC | PRN
  Administered 2018-05-15 – 2018-05-16 (×4): 0.5 mg via INTRAVENOUS
  Filled 2018-05-15 (×4): qty 1

## 2018-05-15 MED ORDER — SODIUM CHLORIDE 0.9 % IV SOLN
2.0000 g | Freq: Two times a day (BID) | INTRAVENOUS | Status: DC
  Administered 2018-05-15 – 2018-05-17 (×5): 2 g via INTRAVENOUS
  Filled 2018-05-15 (×7): qty 2

## 2018-05-15 MED ORDER — METHYLPREDNISOLONE SODIUM SUCC 40 MG IJ SOLR
40.0000 mg | Freq: Two times a day (BID) | INTRAMUSCULAR | Status: DC
  Administered 2018-05-15 – 2018-05-17 (×5): 40 mg via INTRAVENOUS
  Filled 2018-05-15 (×5): qty 1

## 2018-05-15 MED ORDER — PERFLUTREN LIPID MICROSPHERE
1.0000 mL | INTRAVENOUS | Status: DC | PRN
  Administered 2018-05-15: 2 mL via INTRAVENOUS

## 2018-05-15 NOTE — Progress Notes (Signed)
Pharmacy Antibiotic Note  Jasmine Black is a 67 y.o. female admitted on 2017-12-30 with pneumonia.  Pharmacy has been consulted for cefepime and vancomycin dosing.  Plan: Vancomycin 1000mg  IV every 24 hours.  Goal trough 15-20 mcg/mL. cefepime 2gm iv q12h  Height: 5' (152.4 cm) Weight: 105 lb 11.2 oz (47.9 kg) IBW/kg (Calculated) : 45.5  Temp (24hrs), Avg:97.8 F (36.6 C), Min:97.4 F (36.3 C), Max:98.1 F (36.7 C)  Recent Labs  Lab March 24, 2018 1152 March 24, 2018 1238 March 24, 2018 1400 05/12/18 0618 05/13/18 0633  WBC 11.3*  --   --  9.9 11.8*  CREATININE 1.06*  --   --  1.00 0.86  LATICACIDVEN  --  2.9* 1.6  --   --     Estimated Creatinine Clearance: 44.3 mL/min (by C-G formula based on SCr of 0.86 mg/dL).    Allergies  Allergen Reactions  . Neosporin [Neomycin-Bacitracin Zn-Polymyx] Rash    Antimicrobials this admission: 11/17 cefepime >>  11/17 vancomycin >>   Microbiology results: 11/17 BCx: sent 11/17 UCx: sent  11/17 Sputum: sent  11/17 MRSA PCR: sent  Thank you for allowing pharmacy to be a part of this patient's care.  Gerre PebblesGarrett Edmonia Gonser 05/15/2018 7:31 AM

## 2018-05-15 NOTE — Progress Notes (Signed)
  Echocardiogram 2D Echocardiogram with definity has been performed.  Jasmine Black, Deacon Gadbois M 05/15/2018, 2:13 PM

## 2018-05-15 NOTE — Progress Notes (Signed)
Pt is eating. BIPAP will placed after pt has finished

## 2018-05-15 NOTE — Progress Notes (Signed)
Pt is still eating. Family at bedside. RN aware

## 2018-05-15 NOTE — Progress Notes (Signed)
PROGRESS NOTE  Jasmine Black  ZOX:[id number] DOB: 1951/02/06 DOA: 05/06/2018 PCP: Elfredia Nevins, MD  Outpatient Specialists: Cardiology, Dr. Royann Shivers  Brief Narrative: Jasmine Black is a 67 y.o. 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 She had gone to her PCP a few days prior for a week of fevers, chills, increasing cough, and diagnosed with pneumonia, started on doxycycline and nebulized breathing treatments at home but has had no improvement. Over the past 24 hours she's grown increasingly weak, getting up out of bed rarely, much more short of breath when she attempts this and constantly feels like she's about to pass out. On initial evaluation she was afebrile, but was 100.53F at home prior to taking tylenol. Tachycardic with soft BP, hypoxic to 88% on room air, improved with 2L O2, tachypneic. WBC 11.3k, flu negative, lactic acid elevated. Levaquin and IV fluids given after blood cultures drawn. She continued to be hypoxic and developed wheezing for which steroids and breathing treatments were provided. Respiratory effort increased and hypoxia worsened prompting pulmonology consultation and transfer to SDU/ICU on 11/18. Due to worsening hypoxia, intubation will be pursued.  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  - ABG with pO2 60 despite 100% FiO2. Respiratory effort increasing. Discussed with Dr. Juanetta Gosling who agrees intubation now is indicated. - Given lasix x1  empirically, BNP elevated. Echo pending - Continue bronchodilators and IV steroids.  Sepsis due to multilobar pneumonia: Flu negative. - Continue levaquin, transition to po. PCT reassuring at 0.19 > 0.16 > 0.13 and undetectable on 11/18. - Monitor blood cultures, NGTD - Sputum culture pending  HTN: Currently soft blood pressures.  - Continue to hold lisinopril  PVCs: Troponin negative, no ectopy on ECG. - Continue verapamil.  Hypothyroidism:  - Continue home variable synthroid dosing.  Thrombocytosis: Reactive, monitor.  Hyponatremia, hypochloremia: Resolved.   Stage III CKD: Creatinine at baseline on admission.  - Monitor BMP closely. Multiple insults recently with contrast, lasix, vancomycin.   Carotid stenosis: Continue QOD ASA.  Hyperlipidemia: Continue statin  Normocytic anemia: Presumably of chronic disease. Worse today without active bleeding. Hgb stable on recheck, no bleeding.  - Recheck CBC intermittently  DVT prophylaxis: Lovenox Code Status: Full Family Communication: Husband and DIL at bedside Disposition Plan: Uncertain. Guarded prognosis.   Consultants:   Pulmonology  Procedures:   Echocardiogram 11/18 pending  Anticipate ETT 11/18 >>   Antimicrobials:  Levaquin 11/14 - 11/18  Vancomycin 11/18 >>   Cefepime 11/18 >>    Subjective: Continues to decompensate. Increasing respiratory effort today.   Objective: Vitals:   05/15/18 0945 05/15/18 1128 05/15/18 1225 05/15/18 1332  BP:      Pulse:  99    Resp:  (!) 28    Temp: (!) 97.4 F (36.3 C) 97.6 F (36.4 C)    TempSrc: Oral Oral    SpO2:  (!) 86% 92% 90%  Weight: 75.1 kg     Height: 5' (1.524 m)       Intake/Output Summary (Last 24 hours) at 05/15/2018 1604 Last  data filed at 05/15/2018 1509 Gross per 24 hour  Intake 512.04 ml  Output 400 ml  Net 112.04 ml   Filed Weights   05/08/2018 1128 05/13/18 2102 05/15/18 0945  Weight: 67.1 kg 47.9 kg 75.1 kg   Gen: 67 y.o.  female in moderate respiratory distress Pulm: Retractions, tachypnea and hypoxic, bilateral rales and rhonchi. CV: Regular tachycardia. + systolic murmur, rub, or gallop. No JVD, trace dependent edema. GI: Abdomen soft, non-tender, non-distended, with normoactive bowel sounds.  Ext: Warm, no deformities Skin: No rashes, lesions or ulcers on visualized skin.  Neuro: Alert and oriented. No focal neurological deficits. Psych: Judgement and insight appear fair. Mood euthymic & affect congruent. Behavior is appropriate.    Data Reviewed: I have personally reviewed following labs and imaging studies  CBC: Recent Labs  Lab 05/24/2018 1152 05/12/18 0618 05/13/18 0633 05/15/18 0801  WBC 11.3* 9.9 11.8* 26.4*  NEUTROABS 8.4*  --   --   --   HGB 11.3* 9.5* 9.6* 9.6*  HCT 34.8* 30.1* 29.9* 30.4*  MCV 89.0 89.3 91.4 92.7  PLT 525* 417* 432* 576*   Basic Metabolic Panel: Recent Labs  Lab 05/09/2018 1152 05/12/18 0618 05/13/18 0633 05/15/18 0801  NA 129* 131* 132* 135  K 3.6 4.1 3.9 4.1  CL 96* 104 106 107  CO2 21* 20* 20* 19*  GLUCOSE 149* 94 96 124*  BUN 11 9 7* 11  CREATININE 1.06* 1.00 0.86 0.94  CALCIUM 8.5* 7.8* 7.4* 8.2*   GFR: Estimated Creatinine Clearance: 51.1 mL/min (by C-G formula based on SCr of 0.94 mg/dL). Liver Function Tests: Recent Labs  Lab 05/15/18 0801  AST 30  ALT 16  ALKPHOS 73  BILITOT 0.4  PROT 6.2*  ALBUMIN 2.0*   No results for input(s): LIPASE, AMYLASE in the last 168 hours. No results for input(s): AMMONIA in the last 168 hours. Coagulation Profile: No results for input(s): INR, PROTIME in the last 168 hours. Cardiac Enzymes: Recent Labs  Lab 05/07/2018 1152  TROPONINI <0.03   BNP (last 3 results) No results for input(s): PROBNP in the last 8760 hours. HbA1C: No results for input(s): HGBA1C in the last 72 hours. CBG: No results for input(s): GLUCAP in the last 168 hours. Lipid Profile: No results for input(s): CHOL, HDL, LDLCALC, TRIG,  CHOLHDL, LDLDIRECT in the last 72 hours. Thyroid Function Tests: No results for input(s): TSH, T4TOTAL, FREET4, T3FREE, THYROIDAB in the last 72 hours. Anemia Panel: No results for input(s): VITAMINB12, FOLATE, FERRITIN, TIBC, IRON, RETICCTPCT in the last 72 hours. Urine analysis:    Component Value Date/Time   COLORURINE YELLOW 05/17/2018 1152   APPEARANCEUR CLEAR 05/22/2018 1152   LABSPEC 1.010 05/05/2018 1152   PHURINE 6.0 05/02/2018 1152   GLUCOSEU NEGATIVE 05/13/2018 1152   HGBUR NEGATIVE 05/27/2018 1152   BILIRUBINUR NEGATIVE 05/15/2018 1152   KETONESUR NEGATIVE 05/09/2018 1152   PROTEINUR NEGATIVE 05/09/2018 1152   UROBILINOGEN 0.2 01/07/2010 1316   NITRITE NEGATIVE 05/27/2018 1152   LEUKOCYTESUR TRACE (A) 05/23/2018 1152   Recent Results (from the past 240 hour(s))  Urine culture     Status: None   Collection Time: 05/17/2018 11:52 AM  Result Value Ref Range Status   Specimen Description   Final    URINE, RANDOM Performed at [facility], [address] [place], [place] [address]    Special Requests   Final    NONE Performed at [facility], [address] [place], [place] [address]    Culture  Final    NO GROWTH Performed at [facility], [address] [place], [place] [address]    Report Status 05/12/2018 FINAL  Final  Culture, blood (routine x 2)     Status: None (Preliminary result)   Collection Time: May 30, 2018 12:38 PM  Result Value Ref Range Status   Specimen Description BLOOD RIGHT ARM  Final   Special Requests   Final    BOTTLES DRAWN AEROBIC AND ANAEROBIC Blood Culture adequate volume   Culture   Final    NO GROWTH 4 DAYS Performed at [facility], [address] [place], [place] [address]    Report Status PENDING  Incomplete  Culture, blood (routine x 2)     Status: None (Preliminary result)   Collection Time: 2018/05/30 12:38 PM  Result Value Ref Range Status   Specimen Description BLOOD RIGHT HAND  Final   Special Requests   Final     BOTTLES DRAWN AEROBIC AND ANAEROBIC Blood Culture adequate volume   Culture   Final    NO GROWTH 4 DAYS Performed at [facility], [address], [place], [place] [address]    Report Status PENDING  Incomplete  MRSA PCR Screening     Status: None   Collection Time: 05/15/18  7:17 AM  Result Value Ref Range Status   MRSA by PCR NEGATIVE NEGATIVE Final    Comment:        The GeneXpert MRSA Assay (FDA approved for NASAL specimens only), is one component of a comprehensive MRSA colonization surveillance program. It is not intended to diagnose MRSA infection nor to guide or monitor treatment for MRSA infections. Performed at [facility], [address], [place], [place] [address]       Radiology Studies: Ct Angio Chest Pe W Or Wo Contrast  Result Date: 05/14/2018 CLINICAL DATA:  Shortness of breath worsening today. Clinical concern for pulmonary embolus. Positive D-dimer. EXAM: CT ANGIOGRAPHY CHEST WITH CONTRAST TECHNIQUE: Multidetector CT imaging of the chest was performed using the standard protocol during bolus administration of intravenous contrast. Multiplanar CT image reconstructions and MIPs were obtained to evaluate the vascular anatomy. CONTRAST:  75mL ISOVUE-370 IOPAMIDOL (ISOVUE-370) INJECTION 76% COMPARISON:  None FINDINGS: Cardiovascular: The heart size is normal. No substantial pericardial effusion. No thoracic aortic aneurysm. No filling defect in the opacified pulmonary arteries to suggest the presence of an acute pulmonary embolus. Mediastinum/Nodes: 14 mm short axis subcarinal lymph node is associated with mild bilateral hilar lymphadenopathy. The esophagus has normal imaging features. There is no axillary lymphadenopathy. Lungs/Pleura: The central tracheobronchial airways are patent. Asymmetric patchy airspace disease, right greater than left is associated with interlobular septal thickening. Confluent airspace disease noted in the right lower lobe posterior left lower lobe.  Small to moderate right pleural effusion evident with small left pleural effusion. Upper Abdomen: The liver shows diffusely decreased attenuation suggesting steatosis. Musculoskeletal: No worrisome lytic or sclerotic osseous abnormality. Review of the MIP images confirms the above findings. IMPRESSION: 1. No CT evidence for acute pulmonary embolus. 2. Bilateral patchy, right greater than left airspace disease compatible with multifocal pneumonia. 3. Mild mediastinal and bilateral hilar lymphadenopathy. 4. Small to moderate bilateral pleural effusions.  The Electronically Signed   By: Kennith Center M.D.   On: 05/14/2018 17:35    Scheduled Meds: . aspirin  81 mg Oral QODAY  . enoxaparin (LOVENOX) injection  40 mg Subcutaneous Q24H  . pseudoephedrine  120 mg Oral Daily   And  . guaiFENesin  1,200  mg Oral Daily  . ipratropium-albuterol  3 mL Nebulization Q6H WA  . levothyroxine  75 mcg Oral Once per day on Mon Tue Wed Thu Fri  . loratadine  10 mg Oral Daily  . mouth rinse  15 mL Mouth Rinse BID  . methylPREDNISolone (SOLU-MEDROL) injection  40 mg Intravenous Q12H  . pravastatin  40 mg Oral QPM  . sodium chloride flush  3 mL Intravenous Q12H  . verapamil  240 mg Oral QHS   Continuous Infusions: . sodium chloride 10 mL/hr at 05/15/18 1509  . ceFEPime (MAXIPIME) IV Stopped (05/15/18 1118)  . levofloxacin (LEVAQUIN) IV Stopped (05/15/18 1408)  . [START ON 05/16/2018] vancomycin       LOS: 3 days   Time spent: 35 minutes.  Tyrone Nine, MD [facility] [email or web address] Password [email or web address] 05/15/2018, 4:04 PM

## 2018-05-15 NOTE — Progress Notes (Signed)
Pt placed on BIPAP due to low O2 saturation. ABG to follow in 1hr

## 2018-05-15 NOTE — Progress Notes (Addendum)
Patient currently on 6 LPM and O2 saturation is 93%.  Will continue to monitor.

## 2018-05-15 NOTE — Progress Notes (Signed)
Patient back on 9 LPM HF due to O2 saturation of 85%.  Respiratory notified.  Will notify on coming MD via text page.

## 2018-05-15 NOTE — Progress Notes (Signed)
[facility] Attempt to call report twice unsuccessfully. Patient was transported to the unit by charge nurse and tech via wheelchair and portable oxygen. IV Vancomycin was completed at time of transfer so IV was disconnected from patient. Maxipime was not available at scheduled time, thus it was not administered at scheduled time. Other scheduled medications were not administered due to orders being placed at patient transfer. Nurse Verdon CumminsJesse in ICU made aware of situation. Medications were appropriately sent with the patient to the ICU.  Report was given to ICU nurse Brayton CavesJessie at [phone].

## 2018-05-15 NOTE — Consult Note (Signed)
Consult requested by: [facility] hospitalist, Dr. Jarvis Newcomer Consult requested for: Respiratory failure/multifocal pneumonia  HPI: This is a 67 year old who came to the emergency department with increasing cough congestion and shortness of breath.  She says she got sick on November 2 with what she thought was an episode of bronchitis.  She is had multiple episodes of bronchitis in the past.  She is an ex-cigarette smoker but stopped approximately 30 years ago.  She does have a history of childhood asthma and says that she has had trouble with her breathing off and on for most of her life.  She was found to have multifocal pneumonia when she came to the emergency department was started on treatment and initially seem to be improving but yesterday had more trouble more hypoxia had more struggle to breathe and was started on high flow nasal oxygen at about 10 L.  She says she is coughing and is mostly nonproductive because it makes her sick to try to cough up sputum.  She has not had any nausea vomiting diarrhea chest pain abdominal pain no headache no fever or chills although she did have mild fever at home  Past Medical History:  Diagnosis Date  . Arthritis    Osteoprosis  . Chest pain   . Edema   . Hypertension 01/07/2012   Mildly abnormal renal doppler  . 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 PAD (peripheral artery disease) (HCC) 12/2009   right carotid endarterectomy  . Palpitation   . Renal artery stenosis (HCC)   . 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 Thyroid disease      Family History  Problem Relation Age of Onset  . Heart disease Mother        Congestive Heart Failure  . Heart attack Father   . Heart disease Sister   . Scoliosis Son      Social History   Socioeconomic History  . Marital status: Widowed    Spouse name: Not on file  . Number of children: Not on file  . Years of education: Not on file  . Highest  education level: Not on file  Occupational History  . Not on file  Social Needs  . Financial resource strain: Not on file  . Food insecurity:    Worry: Not on file    Inability: Not on file  . Transportation needs:    Medical: Not on file    Non-medical: Not on file  Tobacco Use  . Smoking status: Former Smoker    Last attempt to quit: 09/19/2007    Years since quitting: 10.6  . Smokeless tobacco: Never Used  Substance and Sexual Activity  . Alcohol use: No  . Drug use: No  . Sexual activity: Not Currently    Birth control/protection: Post-menopausal  Lifestyle  . Physical activity:    Days per week: Not on file    Minutes per session: Not on file  . Stress: Not on file  Relationships  . Social connections:    Talks on phone: Not on file    Gets together: Not on file    Attends religious service: Not on file    Active member of club or organization: Not on file    Attends meetings of clubs or organizations: Not on file    Relationship status: Not on file  Other Topics Concern  . Not on file  Social History Narrative  . Not on file  ROS: Except as mentioned 10 point review of systems is negative    Objective: Vital signs in last 24 hours: Temp:  [97.4 F (36.3 C)-98.1 F (36.7 C)] 98.1 F (36.7 C) (11/18 0704) Pulse Rate:  [92-116] 92 (11/18 0704) Resp:  [20-28] 22 (11/18 0704) BP: (103-118)/(42-59) 113/59 (11/18 0704) SpO2:  [81 %-95 %] 88 % (11/18 0730) Weight change:  Last BM Date: 05/13/18  Intake/Output from previous day: 11/17 0701 - 11/18 0700 In: 840 [P.O.:840] Out: 950 [Urine:950]  PHYSICAL EXAM Constitutional: She is awake and alert and in no acute distress but does look short of breath.  Eyes: Pupils reactive EOMI.  Ears nose mouth and throat: Her throat is clear.  No sinus tenderness.  Hearing is grossly normal.  Cardiovascular: Her heart is regular with a systolic heart murmur.  No gallop.  Respiratory: Her respiratory effort is increased  and she has bilateral rales and rhonchi.  Gastrointestinal: Her abdomen is soft with no masses.  Skin: Warm and dry.  Neurological: No focal abnormalities.  Psychiatric: She is mildly anxious  Lab Results: Basic Metabolic Panel: Recent Labs    05/13/18 0633  NA 132*  K 3.9  CL 106  CO2 20*  GLUCOSE 96  BUN 7*  CREATININE 0.86  CALCIUM 7.4*   Liver Function Tests: No results for input(s): AST, ALT, ALKPHOS, BILITOT, PROT, ALBUMIN in the last 72 hours. No results for input(s): LIPASE, AMYLASE in the last 72 hours. No results for input(s): AMMONIA in the last 72 hours. CBC: Recent Labs    05/13/18 0633  WBC 11.8*  HGB 9.6*  HCT 29.9*  MCV 91.4  PLT 432*   Cardiac Enzymes: No results for input(s): CKTOTAL, CKMB, CKMBINDEX, TROPONINI in the last 72 hours. BNP: No results for input(s): PROBNP in the last 72 hours. D-Dimer: Recent Labs    05/14/18 1518  DDIMER 2.95*   CBG: No results for input(s): GLUCAP in the last 72 hours. Hemoglobin A1C: No results for input(s): HGBA1C in the last 72 hours. Fasting Lipid Panel: No results for input(s): CHOL, HDL, LDLCALC, TRIG, CHOLHDL, LDLDIRECT in the last 72 hours. Thyroid Function Tests: No results for input(s): TSH, T4TOTAL, FREET4, T3FREE, THYROIDAB in the last 72 hours. Anemia Panel: No results for input(s): VITAMINB12, FOLATE, FERRITIN, TIBC, IRON, RETICCTPCT in the last 72 hours. Coagulation: No results for input(s): LABPROT, INR in the last 72 hours. Urine Drug Screen: Drugs of Abuse  No results found for: LABOPIA, COCAINSCRNUR, LABBENZ, AMPHETMU, THCU, LABBARB  Alcohol Level: No results for input(s): ETH in the last 72 hours. Urinalysis: No results for input(s): COLORURINE, LABSPEC, PHURINE, GLUCOSEU, HGBUR, BILIRUBINUR, KETONESUR, PROTEINUR, UROBILINOGEN, NITRITE, LEUKOCYTESUR in the last 72 hours.  Invalid input(s): APPERANCEUR Misc. Labs:   ABGS: No results for input(s): PHART, PO2ART, TCO2, HCO3 in the last  72 hours.  Invalid input(s): PCO2   MICROBIOLOGY: Recent Results (from the past 240 hour(s))  Urine culture     Status: None   Collection Time: 2018-03-03 11:52 AM  Result Value Ref Range Status   Specimen Description   Final    URINE, RANDOM Performed at [facility], [address] [place], [place] [address]    Special Requests   Final    NONE Performed at [facility], [address] [place], [place] [address]    Culture   Final    NO GROWTH Performed at [facility], [address] [place], [place] [address]    Report Status 05/12/2018  FINAL  Final  Culture, blood (routine x 2)     Status: None (Preliminary result)   Collection Time: 06/06/18 12:38 PM  Result Value Ref Range Status   Specimen Description BLOOD RIGHT ARM  Final   Special Requests   Final    BOTTLES DRAWN AEROBIC AND ANAEROBIC Blood Culture adequate volume   Culture   Final    NO GROWTH 3 DAYS Performed at [facility], [address], [place], [place] [address]    Report Status PENDING  Incomplete  Culture, blood (routine x 2)     Status: None (Preliminary result)   Collection Time: 2018-06-06 12:38 PM  Result Value Ref Range Status   Specimen Description BLOOD RIGHT HAND  Final   Special Requests   Final    BOTTLES DRAWN AEROBIC AND ANAEROBIC Blood Culture adequate volume   Culture   Final    NO GROWTH 3 DAYS Performed at [facility], [address] [place], [place] [address]    Report Status PENDING  Incomplete    Studies/Results: Ct Angio Chest Pe W Or Wo Contrast  Result Date: 05/14/2018 CLINICAL DATA:  Shortness of breath worsening today. Clinical concern for pulmonary embolus. Positive D-dimer. EXAM: CT ANGIOGRAPHY CHEST WITH CONTRAST TECHNIQUE: Multidetector CT imaging of the chest was performed using the standard protocol during bolus administration of intravenous contrast. Multiplanar CT image reconstructions and MIPs were obtained to evaluate the vascular anatomy. CONTRAST:  75mL  ISOVUE-370 IOPAMIDOL (ISOVUE-370) INJECTION 76% COMPARISON:  None FINDINGS: Cardiovascular: The heart size is normal. No substantial pericardial effusion. No thoracic aortic aneurysm. No filling defect in the opacified pulmonary arteries to suggest the presence of an acute pulmonary embolus. Mediastinum/Nodes: 14 mm short axis subcarinal lymph node is associated with mild bilateral hilar lymphadenopathy. The esophagus has normal imaging features. There is no axillary lymphadenopathy. Lungs/Pleura: The central tracheobronchial airways are patent. Asymmetric patchy airspace disease, right greater than left is associated with interlobular septal thickening. Confluent airspace disease noted in the right lower lobe posterior left lower lobe. Small to moderate right pleural effusion evident with small left pleural effusion. Upper Abdomen: The liver shows diffusely decreased attenuation suggesting steatosis. Musculoskeletal: No worrisome lytic or sclerotic osseous abnormality. Review of the MIP images confirms the above findings. IMPRESSION: 1. No CT evidence for acute pulmonary embolus. 2. Bilateral patchy, right greater than left airspace disease compatible with multifocal pneumonia. 3. Mild mediastinal and bilateral hilar lymphadenopathy. 4. Small to moderate bilateral pleural effusions.  The Electronically Signed   By: Kennith Center M.D.   On: 05/14/2018 17:35    Medications:  Prior to Admission:  Medications Prior to Admission  Medication Sig Dispense Refill Last Dose  . ALPRAZolam (XANAX) 0.5 MG tablet Take 0.5 mg by mouth daily as needed.    Past Month at Unknown time  . aspirin 81 MG tablet Take 81 mg by mouth every other day.    Past Week at Unknown time  . cetirizine (ZYRTEC ALLERGY) 10 MG tablet Take 10 mg by mouth daily.   Past Week at Unknown time  . doxycycline (VIBRA-TABS) 100 MG tablet Take 100 mg by mouth 2 (two) times daily.   05/10/2018 at 1800  . levothyroxine (SYNTHROID, LEVOTHROID) 50 MCG  tablet Take 50 mcg by mouth as directed. Patient takes on Saturday and Sunday; all other days she takes   05/07/2018 at 1000  . levothyroxine (SYNTHROID, LEVOTHROID) 75 MCG tablet Take 75 mcg by mouth as directed. Patient takes  every day except Saturday and Sunday; on Saturday and Sunday, she takes   06/07/2018 at 1000  . lisinopril (PRINIVIL,ZESTRIL) 20 MG tablet TAKE 1/2 TABLET BY MOUTH ONCE DAILY. 45 tablet 3 05/10/2018 at 1800  . pravastatin (PRAVACHOL) 40 MG tablet TAKE ONE TABLET BY MOUTH IN THE EVENING. 90 tablet 2 05/10/2018 at 1800  . Pseudoephedrine-Guaifenesin (MUCINEX D MAX STRENGTH) [id number] MG TB12 Take 1 tablet by mouth daily.   05/10/2018 at Unknown time  . verapamil (VERELAN PM) 240 MG 24 hr capsule TAKE (1) CAPSULE BY MOUTH AT BEDTIME. 30 capsule 2 05/10/2018 at 1800  . zolpidem (AMBIEN) 10 MG tablet Take 10 mg by mouth at bedtime.    05/10/2018 at Unknown time   Scheduled: . aspirin  81 mg Oral QODAY  . enoxaparin (LOVENOX) injection  40 mg Subcutaneous Q24H  . pseudoephedrine  120 mg Oral Daily   And  . guaiFENesin  1,200 mg Oral Daily  . ipratropium-albuterol  3 mL Nebulization Q6H WA  . levothyroxine  75 mcg Oral Once per day on Mon Tue Wed Thu Fri  . loratadine  10 mg Oral Daily  . mouth rinse  15 mL Mouth Rinse BID  . pravastatin  40 mg Oral QPM  . predniSONE  40 mg Oral Q breakfast  . sodium chloride flush  3 mL Intravenous Q12H  . verapamil  240 mg Oral QHS   Continuous: . ceFEPime (MAXIPIME) IV    . vancomycin 1,000 mg (05/15/18 0830)  . [START ON 05/16/2018] vancomycin     [id number] **OR** acetaminophen, albuterol, ALPRAZolam, ondansetron **OR** ondansetron (ZOFRAN) IV, zolpidem  Assesment: She was admitted with multifocal pneumonia and acute hypoxic respiratory failure.  She had been improving but seemed to get worse yesterday.  She has a pleural effusion on chest x-ray and CT which I have personally reviewed and she has BNP  ordered today which I think is appropriate to be sure there were not dealing with some element of heart failure.  She had echocardiogram in 2016 and she does have some cardiac valve issues.  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 Jarvis Newcomer.  Would plan to go ahead with IV steroids.  Add Levaquin because of the multifocal nature of her infiltrate.  Since she is now on 10 L nasal cannula she may need to be transferred to stepdown so she can be more closely monitored    LOS: 3 days   Emmali Karow L 05/15/2018, 8:34 AM

## 2018-05-16 ENCOUNTER — Inpatient Hospital Stay (HOSPITAL_COMMUNITY): Payer: PPO

## 2018-05-16 LAB — BLOOD GAS, ARTERIAL
Acid-base deficit: 2.5 mmol/L — ABNORMAL HIGH (ref 0.0–2.0)
Bicarbonate: 22.5 mmol/L (ref 20.0–28.0)
Delivery systems: POSITIVE
Drawn by: 28459
EXPIRATORY PAP: 5
FIO2: 100
INSPIRATORY PAP: 10
LHR: 10 {breaths}/min
O2 SAT: 93.4 %
PATIENT TEMPERATURE: 37
PCO2 ART: 33.4 mmHg (ref 32.0–48.0)
PO2 ART: 71.1 mmHg — AB (ref 83.0–108.0)
pH, Arterial: 7.42 (ref 7.350–7.450)

## 2018-05-16 LAB — CBC
HCT: 28.8 % — ABNORMAL LOW (ref 36.0–46.0)
Hemoglobin: 9.2 g/dL — ABNORMAL LOW (ref 12.0–15.0)
MCH: 28.6 pg (ref 26.0–34.0)
MCHC: 31.9 g/dL (ref 30.0–36.0)
MCV: 89.4 fL (ref 80.0–100.0)
NRBC: 0 % (ref 0.0–0.2)
PLATELETS: 501 10*3/uL — AB (ref 150–400)
RBC: 3.22 MIL/uL — AB (ref 3.87–5.11)
RDW: 13.6 % (ref 11.5–15.5)
WBC: 20.1 10*3/uL — AB (ref 4.0–10.5)

## 2018-05-16 LAB — BASIC METABOLIC PANEL
ANION GAP: 8 (ref 5–15)
BUN: 14 mg/dL (ref 8–23)
CO2: 21 mmol/L — ABNORMAL LOW (ref 22–32)
Calcium: 8.2 mg/dL — ABNORMAL LOW (ref 8.9–10.3)
Chloride: 108 mmol/L (ref 98–111)
Creatinine, Ser: 0.97 mg/dL (ref 0.44–1.00)
GFR, EST NON AFRICAN AMERICAN: 58 mL/min — AB (ref >60)
GLUCOSE: 141 mg/dL — AB (ref 70–99)
POTASSIUM: 4.1 mmol/L (ref 3.5–5.1)
SODIUM: 137 mmol/L (ref 135–145)

## 2018-05-16 LAB — CULTURE, BLOOD (ROUTINE X 2)
CULTURE: NO GROWTH
CULTURE: NO GROWTH
Special Requests: ADEQUATE
Special Requests: ADEQUATE

## 2018-05-16 MED ORDER — FUROSEMIDE 10 MG/ML IJ SOLN
40.0000 mg | Freq: Once | INTRAMUSCULAR | Status: AC
  Administered 2018-05-16: 40 mg via INTRAVENOUS
  Filled 2018-05-16: qty 4

## 2018-05-16 NOTE — Progress Notes (Signed)
Subjective: She had increasing problems yesterday and ended up going to stepdown and was placed on BiPAP.  She looks improved this morning.  Her oxygenation is improved as of last night and blood gas this morning is pending.  She is still not able to cough anything up.  No other new complaints.  Objective: Vital signs in last 24 hours: Temp:  [97.4 F (36.3 C)-98.3 F (36.8 C)] 98.2 F (36.8 C) (11/19 0755) Pulse Rate:  [89-111] 89 (11/19 0755) Resp:  [21-34] 21 (11/19 0755) BP: (65-131)/(43-75) 101/60 (11/19 0500) SpO2:  [86 %-97 %] 95 % (11/19 0755) FiO2 (%):  [100 %] 100 % (11/19 0111) Weight:  [75.1 kg-76.1 kg] 76.1 kg (11/19 0500) Weight change:  Last BM Date: 05/14/18  Intake/Output from previous day: 11/18 0701 - 11/19 0700 In: 505.5 [I.V.:155.5; IV Piggyback:350] Out: 650 [Urine:650]  PHYSICAL EXAM General appearance: alert, cooperative and mild distress Resp: rhonchi bilaterally Cardio: regular rate and rhythm, S1, S2 normal, no murmur, click, rub or gallop GI: soft, non-tender; bowel sounds normal; no masses,  no organomegaly Extremities: extremities normal, atraumatic, no cyanosis or edema  Lab Results:  Results for orders placed or performed during the hospital encounter of 05/03/2018 (from the past 48 hour(s))  D-dimer, quantitative (not at [facility])     Status: Abnormal   Collection Time: 05/14/18  3:18 PM  Result Value Ref Range   D-Dimer, Quant 2.95 (H) 0.00 - 0.50 ug/mL-FEU    Comment: (NOTE) At the manufacturer cut-off of 0.50 ug/mL FEU, this assay has been documented to exclude PE with a sensitivity and negative predictive value of 97 to 99%.  At this time, this assay has not been approved by the FDA to exclude DVT/VTE. Results should be correlated with clinical presentation. Performed at [facility], [address], [place], [place] [address]   MRSA PCR Screening     Status: None   Collection Time: 05/15/18  7:17 AM  Result Value Ref Range   MRSA by PCR  NEGATIVE NEGATIVE    Comment:        The GeneXpert MRSA Assay (FDA approved for NASAL specimens only), is one component of a comprehensive MRSA colonization surveillance program. It is not intended to diagnose MRSA infection nor to guide or monitor treatment for MRSA infections. Performed at [facility], [address] [place], [place] [address]   Procalcitonin - Baseline     Status: None   Collection Time: 05/15/18  8:01 AM  Result Value Ref Range   Procalcitonin <0.10 ng/mL    Comment:        Interpretation: PCT (Procalcitonin) <= 0.5 ng/mL: Systemic infection (sepsis) is not likely. Local bacterial infection is possible. (NOTE)       Sepsis PCT Algorithm           Lower Respiratory Tract                                      Infection PCT Algorithm    ----------------------------     ----------------------------         PCT < 0.25 ng/mL                PCT < 0.10 ng/mL         Strongly encourage             Strongly discourage   discontinuation of antibiotics    initiation of  antibiotics    ----------------------------     -----------------------------       PCT 0.25 - 0.50 ng/mL            PCT 0.10 - 0.25 ng/mL               OR       >80% decrease in PCT            Discourage initiation of                                            antibiotics      Encourage discontinuation           of antibiotics    ----------------------------     -----------------------------         PCT >= 0.50 ng/mL              PCT 0.26 - 0.50 ng/mL               AND        <80% decrease in PCT             Encourage initiation of                                             antibiotics       Encourage continuation           of antibiotics    ----------------------------     -----------------------------        PCT >= 0.50 ng/mL                  PCT > 0.50 ng/mL               AND         increase in PCT                  Strongly encourage                                      initiation of  antibiotics    Strongly encourage escalation           of antibiotics                                     -----------------------------                                           PCT <= 0.25 ng/mL                                                 OR                                        >  80% decrease in PCT                                     Discontinue / Do not initiate                                             antibiotics Performed at [facility], [address], [place], [place] [address]   CBC     Status: Abnormal   Collection Time: 05/15/18  8:01 AM  Result Value Ref Range   WBC 26.4 (H) 4.0 - 10.5 K/uL   RBC 3.28 (L) 3.87 - 5.11 MIL/uL   Hemoglobin 9.6 (L) 12.0 - 15.0 g/dL   HCT 30.4 (L) 36.0 - 46.0 %   MCV 92.7 80.0 - 100.0 fL   MCH 29.3 26.0 - 34.0 pg   MCHC 31.6 30.0 - 36.0 g/dL   RDW 13.7 11.5 - 15.5 %   Platelets 576 (H) 150 - 400 K/uL   nRBC 0.0 0.0 - 0.2 %    Comment: Performed at [facility], [address] [place], [place] [address]  Comprehensive metabolic panel     Status: Abnormal   Collection Time: 05/15/18  8:01 AM  Result Value Ref Range   Sodium 135 135 - 145 mmol/L   Potassium 4.1 3.5 - 5.1 mmol/L   Chloride 107 98 - 111 mmol/L   CO2 19 (L) 22 - 32 mmol/L   Glucose, Bld 124 (H) 70 - 99 mg/dL   BUN 11 8 - 23 mg/dL   Creatinine, Ser 0.94 0.44 - 1.00 mg/dL   Calcium 8.2 (L) 8.9 - 10.3 mg/dL   Total Protein 6.2 (L) 6.5 - 8.1 g/dL   Albumin 2.0 (L) 3.5 - 5.0 g/dL   AST 30 15 - 41 U/L   ALT 16 0 - 44 U/L   Alkaline Phosphatase 73 38 - 126 U/L   Total Bilirubin 0.4 0.3 - 1.2 mg/dL   GFR calc non Af Amer >60 >60 mL/min   GFR calc Af Amer >60 >60 mL/min    Comment: (NOTE) The eGFR has been calculated using the CKD EPI equation. This calculation has not been validated in all clinical situations. eGFR's persistently <60 mL/min signify possible Chronic Kidney Disease.    Anion gap 9 5 - 15    Comment: Performed at [facility], [address],  [place], [place] [address]  Brain natriuretic peptide     Status: Abnormal   Collection Time: 05/15/18  8:01 AM  Result Value Ref Range   B Natriuretic Peptide 246.0 (H) 0.0 - 100.0 pg/mL    Comment: Performed at [facility], [address], [place], [place] [address]  Blood gas, arterial     Status: Abnormal   Collection Time: 05/15/18  3:12 PM  Result Value Ref Range   FIO2 100.00    Delivery systems HI FLOW NASAL CANNULA    pH, Arterial 7.410 7.350 - 7.450   pCO2 arterial 33.8 32.0 - 48.0 mmHg   pO2, Arterial 60.3 (L) 83.0 - 108.0 mmHg   Bicarbonate 22.1 20.0 - 28.0 mmol/L   Acid-base deficit 2.9 (H) 0.0 - 2.0 mmol/L   O2 Saturation 89.8 %   Patient temperature 37.0    Collection site RIGHT RADIAL    Drawn by  [id number]    Sample type ARTERIAL DRAW    Allens test (pass/fail) PASS PASS    Comment: Performed at [facility], [address], [place], [place] [address]  Blood gas, arterial     Status: Abnormal   Collection Time: 05/15/18  8:25 PM  Result Value Ref Range   FIO2 100.00    Delivery systems BILEVEL POSITIVE AIRWAY PRESSURE    Inspiratory PAP 10.0    Expiratory PAP 5.0    pH, Arterial 7.411 7.350 - 7.450   pCO2 arterial 34.5 32.0 - 48.0 mmHg   pO2, Arterial 73.7 (L) 83.0 - 108.0 mmHg   Bicarbonate 22.6 20.0 - 28.0 mmol/L   Acid-base deficit 2.4 (H) 0.0 - 2.0 mmol/L   O2 Saturation 93.9 %   Patient temperature 37.0    Collection site RIGHT RADIAL    Drawn by [id number]    Sample type ARTERIAL    Allens test (pass/fail) PASS PASS    Comment: Performed at [facility], [address] [place], [place] [address]  CBC     Status: Abnormal   Collection Time: 05/16/18  6:07 AM  Result Value Ref Range   WBC 20.1 (H) 4.0 - 10.5 K/uL   RBC 3.22 (L) 3.87 - 5.11 MIL/uL   Hemoglobin 9.2 (L) 12.0 - 15.0 g/dL   HCT 28.8 (L) 36.0 - 46.0 %   MCV 89.4 80.0 - 100.0 fL   MCH 28.6 26.0 - 34.0 pg   MCHC 31.9 30.0 - 36.0 g/dL   RDW 13.6 11.5 - 15.5 %   Platelets 501 (H) 150 - 400 K/uL   nRBC  0.0 0.0 - 0.2 %    Comment: Performed at [facility], [address] [place], [place] [address]  Basic metabolic panel     Status: Abnormal   Collection Time: 05/16/18  6:07 AM  Result Value Ref Range   Sodium 137 135 - 145 mmol/L   Potassium 4.1 3.5 - 5.1 mmol/L   Chloride 108 98 - 111 mmol/L   CO2 21 (L) 22 - 32 mmol/L   Glucose, Bld 141 (H) 70 - 99 mg/dL   BUN 14 8 - 23 mg/dL   Creatinine, Ser 0.97 0.44 - 1.00 mg/dL   Calcium 8.2 (L) 8.9 - 10.3 mg/dL   GFR calc non Af Amer 58 (L) >60 mL/min   GFR calc Af Amer >60 >60 mL/min    Comment: (NOTE) The eGFR has been calculated using the CKD EPI equation. This calculation has not been validated in all clinical situations. eGFR's persistently <60 mL/min signify possible Chronic Kidney Disease.    Anion gap 8 5 - 15    Comment: Performed at [facility], [address], [place], [place] [address]    ABGS Recent Labs    05/15/18 2025  PHART 7.411  PO2ART 73.7*  HCO3 22.6   CULTURES Recent Results (from the past 240 hour(s))  Urine culture     Status: None   Collection Time: 05/07/2018 11:52 AM  Result Value Ref Range Status   Specimen Description   Final    URINE, RANDOM Performed at [facility], [address] [place], [place] [address]    Special Requests   Final    NONE Performed at [facility], [address], [place], [place] [address]    Culture   Final    NO GROWTH Performed at [facility], [place] [address], [place], [place] [address]    Report Status 05/12/2018 FINAL  Final  Culture, blood (routine x 2)     Status: None   Collection Time: 04/29/2018 12:38 PM  Result Value Ref Range Status   Specimen Description BLOOD RIGHT ARM  Final   Special Requests   Final    BOTTLES DRAWN AEROBIC AND ANAEROBIC Blood Culture adequate volume   Culture   Final    NO GROWTH 5 DAYS Performed at [facility], [address], [place], [place] [address]    Report Status 05/16/2018 FINAL  Final  Culture, blood (routine x 2)      Status: None   Collection Time: 05/07/2018 12:38 PM  Result Value Ref Range Status   Specimen Description BLOOD RIGHT HAND  Final   Special Requests   Final    BOTTLES DRAWN AEROBIC AND ANAEROBIC Blood Culture adequate volume   Culture   Final    NO GROWTH 5 DAYS Performed at [facility], [address], [place], [place] [address]    Report Status 05/16/2018 FINAL  Final  MRSA PCR Screening     Status: None   Collection Time: 05/15/18  7:17 AM  Result Value Ref Range Status   MRSA by PCR NEGATIVE NEGATIVE Final    Comment:        The GeneXpert MRSA Assay (FDA approved for NASAL specimens only), is one component of a comprehensive MRSA colonization surveillance program. It is not intended to diagnose MRSA infection nor to guide or monitor treatment for MRSA infections. Performed at [facility], [address], [place],  [address]    Studies/Results: Ct Angio Chest Pe W Or Wo Contrast  Result Date: 05/14/2018 CLINICAL DATA:  Shortness of breath worsening today. Clinical concern for pulmonary embolus. Positive D-dimer. EXAM: CT ANGIOGRAPHY CHEST WITH CONTRAST TECHNIQUE: Multidetector CT imaging of the chest was performed using the standard protocol during bolus administration of intravenous contrast. Multiplanar CT image reconstructions and MIPs were obtained to evaluate the vascular anatomy. CONTRAST:  32m ISOVUE-370 IOPAMIDOL (ISOVUE-370) INJECTION 76% COMPARISON:  None FINDINGS: Cardiovascular: The heart size is normal. No substantial pericardial effusion. No thoracic aortic aneurysm. No filling defect in the opacified pulmonary arteries to suggest the presence of an acute pulmonary embolus. Mediastinum/Nodes: 14 mm short axis subcarinal lymph node is associated with mild bilateral hilar lymphadenopathy. The esophagus has normal imaging features. There is no axillary lymphadenopathy. Lungs/Pleura: The central tracheobronchial airways are patent. Asymmetric patchy airspace  disease, right greater than left is associated with interlobular septal thickening. Confluent airspace disease noted in the right lower lobe posterior left lower lobe. Small to moderate right pleural effusion evident with small left pleural effusion. Upper Abdomen: The liver shows diffusely decreased attenuation suggesting steatosis. Musculoskeletal: No worrisome lytic or sclerotic osseous abnormality. Review of the MIP images confirms the above findings. IMPRESSION: 1. No CT evidence for acute pulmonary embolus. 2. Bilateral patchy, right greater than left airspace disease compatible with multifocal pneumonia. 3. Mild mediastinal and bilateral hilar lymphadenopathy. 4. Small to moderate bilateral pleural effusions.  The Electronically Signed   By: EMisty StanleyM.D.   On: 05/14/2018 17:35    Medications:  Prior to Admission:  Medications Prior to Admission  Medication Sig Dispense Refill Last Dose  . ALPRAZolam (XANAX) 0.5 MG tablet Take 0.5 mg by mouth daily as needed.    Past Month at Unknown time  . aspirin 81 MG tablet Take 81 mg by mouth every other day.    Past Week at Unknown time  . cetirizine (ZYRTEC ALLERGY) 10  MG tablet Take 10 mg by mouth daily.   Past Week at Unknown time  . doxycycline (VIBRA-TABS) 100 MG tablet Take 100 mg by mouth 2 (two) times daily.   05/10/2018 at 1800  . levothyroxine (SYNTHROID, LEVOTHROID) 50 MCG tablet Take 50 mcg by mouth as directed. Patient takes 71mg on Saturday and Sunday; all other days she takes 759m   05/07/2018 at 1000  . levothyroxine (SYNTHROID, LEVOTHROID) 75 MCG tablet Take 75 mcg by mouth as directed. Patient takes 7544mevery day except Saturday and Sunday; on Saturday and Sunday, she takes 45m44m 05/07/2018 at 1000  . lisinopril (PRINIVIL,ZESTRIL) 20 MG tablet TAKE 1/2 TABLET BY MOUTH ONCE DAILY. 45 tablet 3 05/10/2018 at 1800  . pravastatin (PRAVACHOL) 40 MG tablet TAKE ONE TABLET BY MOUTH IN THE EVENING. 90 tablet 2 05/10/2018 at 1800  .  Pseudoephedrine-Guaifenesin (MUCINEX D MAX STRENGTH) [phone] MG TB12 Take 1 tablet by mouth daily.   05/10/2018 at Unknown time  . verapamil (VERELAN PM) 240 MG 24 hr capsule TAKE (1) CAPSULE BY MOUTH AT BEDTIME. 30 capsule 2 05/10/2018 at 1800  . zolpidem (AMBIEN) 10 MG tablet Take 10 mg by mouth at bedtime.    05/10/2018 at Unknown time   Scheduled: . aspirin  81 mg Oral QODAY  . enoxaparin (LOVENOX) injection  40 mg Subcutaneous Q24H  . pseudoephedrine  120 mg Oral Daily   And  . guaiFENesin  1,200 mg Oral Daily  . ipratropium-albuterol  3 mL Nebulization Q6H WA  . levothyroxine  75 mcg Oral Once per day on Mon Tue Wed Thu Fri  . loratadine  10 mg Oral Daily  . mouth rinse  15 mL Mouth Rinse BID  . methylPREDNISolone (SOLU-MEDROL) injection  40 mg Intravenous Q12H  . pravastatin  40 mg Oral QPM  . sodium chloride flush  3 mL Intravenous Q12H  . verapamil  240 mg Oral QHS   Continuous: . sodium chloride 10 mL/hr at 05/16/18 0500  . ceFEPime (MAXIPIME) IV Stopped (05/15/18 2148)  . levofloxacin (LEVAQUIN) IV Stopped (05/15/18 1408)  . vancomycin     PRN:QUI:VHOYWVoride, acetaminophen **OR** acetaminophen, albuterol, LORazepam, ondansetron **OR** ondansetron (ZOFRAN) IV, zolpidem  Assesment: She has acute hypoxic respiratory failure related to multifocal pneumonia.  She continued to have difficulty with her oxygenation and with respiratory distress and she is now in stepdown and on BiPAP.  She is on 3 antibiotics.  She is on steroids.  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  Check chest x-ray.  Check blood gas.    LOS: 4 days   Kareem Aul  L 05/16/2018, 8:50 AM

## 2018-05-16 NOTE — Progress Notes (Signed)
PROGRESS NOTE  Jasmine Black  ZOX:[id number] DOB: [birth year] DOA: 05/01/2018 PCP: Elfredia Nevins, MD  Outpatient Specialists: Cardiology, Dr. Royann Shivers  Brief Narrative: Jasmine Black is a 67 y.o. 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 She had gone to her PCP a few days prior for a week of fevers, chills, increasing cough, and diagnosed with pneumonia, started on doxycycline and nebulized breathing treatments at home but has had no improvement. Over the past 24 hours she's grown increasingly weak, getting up out of bed rarely, much more short of breath when she attempts this and constantly feels like she's about to pass out. On initial evaluation she was afebrile, but was 100.66F at home prior to taking tylenol. Tachycardic with soft BP, hypoxic to 88% on room air, improved with 2L O2, tachypneic. WBC 11.3k, flu negative, lactic acid elevated. Levaquin and IV fluids given after blood cultures drawn. She continued to be hypoxic and developed wheezing for which steroids and breathing treatments were provided. Respiratory effort increased and hypoxia worsened prompting pulmonology consultation and transfer to SDU/ICU on 11/18. BiPAP started with improvement in oxygenation.   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 Very likely also an element of interstitial edema. - Continue broad antimicrobial coverage, procalcitonin undetectable. Stopped vancomycin as she failed an adequate trial of  doxycycline, MRSA negative. - Continue IV steroids and regular breathing treatments - Inclined to continue BiPAP for now. SpO2 is stabilized and she appears comfortable. - Will repeat lasix 40mg  IV now and monitor I/O, BMP. BNP elevated, IVC dilated.  Sepsis due to multilobar pneumonia: Flu negative. - Continue broad antibiotics. PCT reassuring at 0.19 > 0.16 > 0.13 and undetectable on 11/18. - Blood cultures negative. - Sputum culture pending/sent  HTN: Currently soft blood pressures.  - Continue to hold lisinopril  PVCs: Troponin negative, no ectopy on ECG. - Continue verapamil.  Hypothyroidism:  - Continue home variable synthroid dosing.  Thrombocytosis: Reactive, monitor.  Leukocytosis: Difficult to distinguish between infection vs. steroid-induced.  - Continue monitoring.  Hyponatremia, hypochloremia: Resolved.   Stage III CKD: Creatinine at baseline on admission. Had lasix, vancomycin, and contrast but seems to be stable. - Continues to be stable, will monitor with redosing lasix.  Carotid stenosis: Continue QOD ASA.  Hyperlipidemia: Continue statin  Normocytic anemia: Presumably of chronic disease. Worse today without active bleeding. Hgb stable on recheck, no bleeding.  - Recheck CBC intermittently  DVT prophylaxis: Lovenox Code Status: Full Family Communication: Multiple at bedside Disposition Plan: Uncertain. Guarded prognosis.   Consultants:   Pulmonology  Procedures:  Echocardiogram 11/18  - Left ventricle: The cavity size was normal. Wall thickness was   increased in a pattern of mild LVH. Systolic function was normal.   The estimated ejection fraction was in the range of 50% to 55%.   Wall motion was normal; there were no regional wall motion   abnormalities. The study is not technically sufficient to allow   evaluation of LV diastolic function. - Aortic valve: Mildly calcified annulus. Normal thickness   leaflets. - Technically  difficult  study. Echocontrast was used to enhance visualization.  BiPAP 11/18 >>   Antimicrobials:  Levaquin 11/14 - 11/18  Vancomycin 11/18  Cefepime 11/18 >>    Subjective: Feels comfortable, denies any chest pain. Tolerating BiPAP.   Objective: Vitals:   05/16/18 0900 05/16/18 1000 05/16/18 1100 05/16/18 1151  BP: (!) 99/53 (!) 104/48 (!) 113/50   Pulse: 93 100 99 96  Resp: (!) 25 (!) 29 (!) 27 (!) 23  Temp:    98.5 F (36.9 C)  TempSrc:    Axillary  SpO2:    92%  Weight:      Height:        Intake/Output Summary (Last 24 hours) at 05/16/2018 1231 Last data filed at 05/16/2018 0500 Gross per 24 hour  Intake 505.53 ml  Output 650 ml  Net -144.47 ml   Filed Weights   05/13/18 2102 05/15/18 0945 05/16/18 0500  Weight: 47.9 kg 75.1 kg 76.1 kg   Gen: 67 y.o. female in no acute distress Pulm: Modestly labored with BiPAP, increased rate, bilateral rhonchi remain.  CV: Regular rate and rhythm. + systolic murmur, rub, or gallop. No JVD, no dependent edema. GI: Abdomen soft, non-tender, non-distended, with normoactive bowel sounds.  Ext: Warm, no deformities Skin: No rashes, lesions or ulcers on visualized skin.  Neuro: Alert and oriented. No focal neurological deficits. Psych: Judgement and insight appear fair. Mood euthymic & affect congruent. Behavior is appropriate.    Data Reviewed: I have personally reviewed following labs and imaging studies  CBC: Recent Labs  Lab 05/16/2018 1152 05/12/18 0618 05/13/18 0633 05/15/18 0801 05/16/18 0607  WBC 11.3* 9.9 11.8* 26.4* 20.1*  NEUTROABS 8.4*  --   --   --   --   HGB 11.3* 9.5* 9.6* 9.6* 9.2*  HCT 34.8* 30.1* 29.9* 30.4* 28.8*  MCV 89.0 89.3 91.4 92.7 89.4  PLT 525* 417* 432* 576* 501*   Basic Metabolic Panel: Recent Labs  Lab 04/29/2018 1152 05/12/18 0618 05/13/18 0633 05/15/18 0801 05/16/18 0607  NA 129* 131* 132* 135 137  K 3.6 4.1 3.9 4.1 4.1  CL 96* 104 106 107 108  CO2 21* 20* 20* 19* 21*  GLUCOSE 149* 94  96 124* 141*  BUN 11 9 7* 11 14  CREATININE 1.06* 1.00 0.86 0.94 0.97  CALCIUM 8.5* 7.8* 7.4* 8.2* 8.2*   GFR: Estimated Creatinine Clearance: 49.9 mL/min (by C-G formula based on SCr of 0.97 mg/dL). Liver Function Tests: Recent Labs  Lab 05/15/18 0801  AST 30  ALT 16  ALKPHOS 73  BILITOT 0.4  PROT 6.2*  ALBUMIN 2.0*   No results for input(s): LIPASE, AMYLASE in the last 168 hours. No results for input(s): AMMONIA in the last 168 hours. Coagulation Profile: No results for input(s): INR, PROTIME in the last 168 hours. Cardiac Enzymes: Recent Labs  Lab 05/10/2018 1152  TROPONINI <0.03   BNP (last 3 results) No results for input(s): PROBNP in the last 8760 hours. HbA1C: No results for input(s): HGBA1C in the last 72 hours. CBG: No results for input(s): GLUCAP in the last 168 hours. Lipid Profile: No results for input(s): CHOL, HDL, LDLCALC, TRIG, CHOLHDL, LDLDIRECT in the last 72 hours. Thyroid Function Tests: No results for input(s): TSH, T4TOTAL, FREET4, T3FREE, THYROIDAB in the last 72 hours. Anemia Panel: No results for input(s): VITAMINB12, FOLATE, FERRITIN, TIBC, IRON, RETICCTPCT in the last 72 hours. Urine analysis:    Component Value Date/Time   COLORURINE YELLOW 05/19/2018 1152  APPEARANCEUR CLEAR 05/13/2018 1152   LABSPEC 1.010 05/22/2018 1152   PHURINE 6.0 04/28/2018 1152   GLUCOSEU NEGATIVE 04/29/2018 1152   HGBUR NEGATIVE 05/09/2018 1152   BILIRUBINUR NEGATIVE 05/20/2018 1152   KETONESUR NEGATIVE 05/01/2018 1152   PROTEINUR NEGATIVE 05/09/2018 1152   UROBILINOGEN 0.2 01/07/2010 1316   NITRITE NEGATIVE 05/24/2018 1152   LEUKOCYTESUR TRACE (A) 05/18/2018 1152   Recent Results (from the past 240 hour(s))  Urine culture     Status: None   Collection Time: 04/29/2018 11:52 AM  Result Value Ref Range Status   Specimen Description   Final    URINE, RANDOM Performed at [facility], [address], [place], [place] [address]    Special Requests   Final     NONE Performed at [facility], [address], [place], [place] [address]    Culture   Final    NO GROWTH Performed at [facility], [address] [place], [place] [address]    Report Status 05/12/2018 FINAL  Final  Culture, blood (routine x 2)     Status: None   Collection Time: 04/28/2018 12:38 PM  Result Value Ref Range Status   Specimen Description BLOOD RIGHT ARM  Final   Special Requests   Final    BOTTLES DRAWN AEROBIC AND ANAEROBIC Blood Culture adequate volume   Culture   Final    NO GROWTH 5 DAYS Performed at [facility], [address] [place], [place] [address]    Report Status 05/16/2018 FINAL  Final  Culture, blood (routine x 2)     Status: None   Collection Time: 05/10/2018 12:38 PM  Result Value Ref Range Status   Specimen Description BLOOD RIGHT HAND  Final   Special Requests   Final    BOTTLES DRAWN AEROBIC AND ANAEROBIC Blood Culture adequate volume   Culture   Final    NO GROWTH 5 DAYS Performed at [facility], [address], [place], [place] [address]    Report Status 05/16/2018 FINAL  Final  MRSA PCR Screening     Status: None   Collection Time: 05/15/18  7:17 AM  Result Value Ref Range Status   MRSA by PCR NEGATIVE NEGATIVE Final    Comment:        The GeneXpert MRSA Assay (FDA approved for NASAL specimens only), is one component of a comprehensive MRSA colonization surveillance program. It is not intended to diagnose MRSA infection nor to guide or monitor treatment for MRSA infections. Performed at [facility], [address] [place], [place] [address]       Radiology Studies: Ct Angio Chest Pe W Or Wo Contrast  Result Date: 05/14/2018 CLINICAL DATA:  Shortness of breath worsening today. Clinical concern for pulmonary embolus. Positive D-dimer. EXAM: CT ANGIOGRAPHY CHEST WITH CONTRAST TECHNIQUE: Multidetector CT imaging of the chest was performed using the standard protocol during bolus administration of intravenous contrast.  Multiplanar CT image reconstructions and MIPs were obtained to evaluate the vascular anatomy. CONTRAST:  75mL ISOVUE-370 IOPAMIDOL (ISOVUE-370) INJECTION 76% COMPARISON:  None FINDINGS: Cardiovascular: The heart size is normal. No substantial pericardial effusion. No thoracic aortic aneurysm. No filling defect in the opacified pulmonary arteries to suggest the presence of an acute pulmonary embolus. Mediastinum/Nodes: 14 mm short axis subcarinal lymph node is associated with mild bilateral hilar lymphadenopathy. The esophagus has normal imaging features. There is no axillary lymphadenopathy. Lungs/Pleura: The central tracheobronchial airways are patent. Asymmetric patchy airspace disease, right greater than left is associated  with interlobular septal thickening. Confluent airspace disease noted in the right lower lobe posterior left lower lobe. Small to moderate right pleural effusion evident with small left pleural effusion. Upper Abdomen: The liver shows diffusely decreased attenuation suggesting steatosis. Musculoskeletal: No worrisome lytic or sclerotic osseous abnormality. Review of the MIP images confirms the above findings. IMPRESSION: 1. No CT evidence for acute pulmonary embolus. 2. Bilateral patchy, right greater than left airspace disease compatible with multifocal pneumonia. 3. Mild mediastinal and bilateral hilar lymphadenopathy. 4. Small to moderate bilateral pleural effusions.  The Electronically Signed   By: Kennith Center M.D.   On: 05/14/2018 17:35   Dg Chest Port 1 View  Result Date: 05/16/2018 CLINICAL DATA:  Respiratory failure EXAM: PORTABLE CHEST 1 VIEW COMPARISON:  CTA chest dated 05/14/2018 FINDINGS: Multifocal patchy opacities, right upper lobe predominant, suspicious for pneumonia. Superimposed mild interstitial edema is possible. No definite pleural effusion. No pneumothorax. The heart is normal in size. IMPRESSION: Multifocal patchy opacities, right upper lobe predominant, suspicious  for pneumonia. Superimposed mild interstitial edema is possible. These findings are better evaluated on recent CT. Electronically Signed   By: Charline Bills M.D.   On: 05/16/2018 09:32    Scheduled Meds: . aspirin  81 mg Oral QODAY  . enoxaparin (LOVENOX) injection  40 mg Subcutaneous Q24H  . furosemide  40 mg Intravenous Once  . pseudoephedrine  120 mg Oral Daily   And  . guaiFENesin  1,200 mg Oral Daily  . ipratropium-albuterol  3 mL Nebulization Q6H WA  . levothyroxine  75 mcg Oral Once per day on Mon Tue Wed Thu Fri  . loratadine  10 mg Oral Daily  . mouth rinse  15 mL Mouth Rinse BID  . methylPREDNISolone (SOLU-MEDROL) injection  40 mg Intravenous Q12H  . pravastatin  40 mg Oral QPM  . sodium chloride flush  3 mL Intravenous Q12H  . verapamil  240 mg Oral QHS   Continuous Infusions: . sodium chloride 10 mL/hr at 05/16/18 0500  . ceFEPime (MAXIPIME) IV 2 g (05/16/18 0933)  . levofloxacin (LEVAQUIN) IV Stopped (05/15/18 1408)     LOS: 4 days   Time spent: 35 minutes.  Tyrone Nine, MD [facility] [email or web address] Password [facility] 05/16/2018, 12:31 PM

## 2018-05-16 NOTE — Plan of Care (Signed)
Patient on bipap

## 2018-05-17 ENCOUNTER — Inpatient Hospital Stay: Payer: Self-pay

## 2018-05-17 ENCOUNTER — Inpatient Hospital Stay (HOSPITAL_COMMUNITY): Payer: PPO | Admitting: Anesthesiology

## 2018-05-17 ENCOUNTER — Inpatient Hospital Stay (HOSPITAL_COMMUNITY): Payer: PPO

## 2018-05-17 DIAGNOSIS — J181 Lobar pneumonia, unspecified organism: Secondary | ICD-10-CM

## 2018-05-17 DIAGNOSIS — E782 Mixed hyperlipidemia: Secondary | ICD-10-CM

## 2018-05-17 DIAGNOSIS — E871 Hypo-osmolality and hyponatremia: Secondary | ICD-10-CM

## 2018-05-17 DIAGNOSIS — Z978 Presence of other specified devices: Secondary | ICD-10-CM

## 2018-05-17 LAB — BLOOD GAS, ARTERIAL
ACID-BASE DEFICIT: 0.1 mmol/L (ref 0.0–2.0)
Acid-base deficit: 1.5 mmol/L (ref 0.0–2.0)
BICARBONATE: 24.4 mmol/L (ref 20.0–28.0)
BICARBONATE: 22.9 mmol/L (ref 20.0–28.0)
DELIVERY SYSTEMS: POSITIVE
DRAWN BY: 28459
Drawn by: 28459
Expiratory PAP: 5
FIO2: 100
FIO2: 100
Inspiratory PAP: 10
LHR: 18 {breaths}/min
MECHVT: 360 mL
O2 Saturation: 90.2 %
O2 Saturation: 91.1 %
PATIENT TEMPERATURE: 37
PCO2 ART: 42.2 mmHg (ref 32.0–48.0)
PEEP: 8 cmH2O
PH ART: 7.358 (ref 7.350–7.450)
PO2 ART: 69.7 mmHg — AB (ref 83.0–108.0)
Patient temperature: 37
pCO2 arterial: 35.5 mmHg (ref 32.0–48.0)
pH, Arterial: 7.438 (ref 7.350–7.450)
pO2, Arterial: 61.9 mmHg — ABNORMAL LOW (ref 83.0–108.0)

## 2018-05-17 LAB — CBC
HEMATOCRIT: 27.7 % — AB (ref 36.0–46.0)
HEMOGLOBIN: 8.8 g/dL — AB (ref 12.0–15.0)
MCH: 27.8 pg (ref 26.0–34.0)
MCHC: 31.8 g/dL (ref 30.0–36.0)
MCV: 87.7 fL (ref 80.0–100.0)
Platelets: 427 10*3/uL — ABNORMAL HIGH (ref 150–400)
RBC: 3.16 MIL/uL — ABNORMAL LOW (ref 3.87–5.11)
RDW: 13.7 % (ref 11.5–15.5)
WBC: 19.3 10*3/uL — ABNORMAL HIGH (ref 4.0–10.5)
nRBC: 0 % (ref 0.0–0.2)

## 2018-05-17 LAB — BASIC METABOLIC PANEL
Anion gap: 9 (ref 5–15)
BUN: 21 mg/dL (ref 8–23)
CHLORIDE: 109 mmol/L (ref 98–111)
CO2: 22 mmol/L (ref 22–32)
CREATININE: 1.02 mg/dL — AB (ref 0.44–1.00)
Calcium: 8.3 mg/dL — ABNORMAL LOW (ref 8.9–10.3)
GFR calc non Af Amer: 55 mL/min — ABNORMAL LOW (ref >60)
GLUCOSE: 142 mg/dL — AB (ref 70–99)
Potassium: 3.6 mmol/L (ref 3.5–5.1)
Sodium: 140 mmol/L (ref 135–145)

## 2018-05-17 LAB — PHOSPHORUS: Phosphorus: 4 mg/dL (ref 2.5–4.6)

## 2018-05-17 LAB — GLUCOSE, CAPILLARY
GLUCOSE-CAPILLARY: 155 mg/dL — AB (ref 70–99)
Glucose-Capillary: 142 mg/dL — ABNORMAL HIGH (ref 70–99)

## 2018-05-17 LAB — PROCALCITONIN: PROCALCITONIN: 0.15 ng/mL

## 2018-05-17 LAB — MAGNESIUM: MAGNESIUM: 2.1 mg/dL (ref 1.7–2.4)

## 2018-05-17 LAB — BRAIN NATRIURETIC PEPTIDE: B Natriuretic Peptide: 231 pg/mL — ABNORMAL HIGH (ref 0.0–100.0)

## 2018-05-17 MED ORDER — FENTANYL CITRATE (PF) 100 MCG/2ML IJ SOLN
50.0000 ug | INTRAMUSCULAR | Status: AC | PRN
  Administered 2018-05-17 (×3): 50 ug via INTRAVENOUS
  Filled 2018-05-17 (×3): qty 2

## 2018-05-17 MED ORDER — LEVOTHYROXINE SODIUM 100 MCG IV SOLR
37.5000 ug | Freq: Every day | INTRAVENOUS | Status: DC
  Administered 2018-05-18 – 2018-05-24 (×7): 37.5 ug via INTRAVENOUS
  Filled 2018-05-17 (×7): qty 5

## 2018-05-17 MED ORDER — FENTANYL CITRATE (PF) 100 MCG/2ML IJ SOLN: 50.0000 ug | INTRAMUSCULAR | Status: DC | PRN

## 2018-05-17 MED ORDER — SODIUM CHLORIDE 0.9 % IV SOLN
1.0000 g | Freq: Two times a day (BID) | INTRAVENOUS | Status: DC
  Administered 2018-05-17 – 2018-05-18 (×2): 1 g via INTRAVENOUS
  Filled 2018-05-17 (×4): qty 1

## 2018-05-17 MED ORDER — SODIUM CHLORIDE 0.9% FLUSH: 10.0000 mL | INTRAVENOUS | Status: DC | PRN

## 2018-05-17 MED ORDER — VITAL HIGH PROTEIN PO LIQD: 1000.0000 mL | ORAL | Status: DC

## 2018-05-17 MED ORDER — ACETYLCYSTEINE 20 % IN SOLN
3.0000 mL | Freq: Four times a day (QID) | RESPIRATORY_TRACT | Status: DC
  Administered 2018-05-17: 3 mL via RESPIRATORY_TRACT
  Filled 2018-05-17: qty 4

## 2018-05-17 MED ORDER — METHYLPREDNISOLONE SODIUM SUCC 125 MG IJ SOLR
60.0000 mg | Freq: Four times a day (QID) | INTRAMUSCULAR | Status: DC
  Administered 2018-05-17 – 2018-05-22 (×19): 60 mg via INTRAVENOUS
  Filled 2018-05-17 (×19): qty 2

## 2018-05-17 MED ORDER — ACETYLCYSTEINE 20 % IN SOLN
3.0000 mL | Freq: Four times a day (QID) | RESPIRATORY_TRACT | Status: DC
  Administered 2018-05-17 – 2018-05-21 (×15): 3 mL via RESPIRATORY_TRACT
  Filled 2018-05-17 (×15): qty 4

## 2018-05-17 MED ORDER — MIDAZOLAM HCL 2 MG/2ML IJ SOLN: 1.0000 mg | INTRAMUSCULAR | Status: DC | PRN

## 2018-05-17 MED ORDER — BUDESONIDE 0.5 MG/2ML IN SUSP
0.5000 mg | Freq: Two times a day (BID) | RESPIRATORY_TRACT | Status: DC
  Administered 2018-05-17 – 2018-05-24 (×15): 0.5 mg via RESPIRATORY_TRACT
  Filled 2018-05-17 (×15): qty 2

## 2018-05-17 MED ORDER — SODIUM CHLORIDE 0.9% FLUSH
10.0000 mL | Freq: Two times a day (BID) | INTRAVENOUS | Status: DC
  Administered 2018-05-17 – 2018-05-24 (×11): 10 mL

## 2018-05-17 MED ORDER — CHLORHEXIDINE GLUCONATE CLOTH 2 % EX PADS
6.0000 | MEDICATED_PAD | Freq: Every day | CUTANEOUS | Status: DC
  Administered 2018-05-17 – 2018-05-23 (×7): 6 via TOPICAL

## 2018-05-17 MED ORDER — SUCCINYLCHOLINE CHLORIDE 20 MG/ML IJ SOLN
INTRAMUSCULAR | Status: DC | PRN
  Administered 2018-05-17: 100 mg via INTRAVENOUS

## 2018-05-17 MED ORDER — FENTANYL BOLUS VIA INFUSION
25.0000 ug | INTRAVENOUS | Status: DC | PRN
  Administered 2018-05-17 – 2018-05-23 (×10): 25 ug via INTRAVENOUS
  Filled 2018-05-17: qty 25

## 2018-05-17 MED ORDER — FENTANYL 2500MCG IN NS 250ML (10MCG/ML) PREMIX INFUSION
25.0000 ug/h | INTRAVENOUS | Status: DC
  Administered 2018-05-17: 100 ug/h via INTRAVENOUS
  Administered 2018-05-18: 125 ug/h via INTRAVENOUS
  Administered 2018-05-18: 175 ug/h via INTRAVENOUS
  Administered 2018-05-19: 150 ug/h via INTRAVENOUS
  Administered 2018-05-20: 100 ug/h via INTRAVENOUS
  Administered 2018-05-21: 125 ug/h via INTRAVENOUS
  Administered 2018-05-21: 150 ug/h via INTRAVENOUS
  Administered 2018-05-22: 100 ug/h via INTRAVENOUS
  Filled 2018-05-17 (×7): qty 250

## 2018-05-17 MED ORDER — CHLORHEXIDINE GLUCONATE 0.12% ORAL RINSE (MEDLINE KIT)
15.0000 mL | Freq: Two times a day (BID) | OROMUCOSAL | Status: DC
  Administered 2018-05-17 – 2018-05-23 (×13): 15 mL via OROMUCOSAL

## 2018-05-17 MED ORDER — FENTANYL CITRATE (PF) 100 MCG/2ML IJ SOLN: 50.0000 ug | Freq: Once | INTRAMUSCULAR | Status: DC

## 2018-05-17 MED ORDER — PRO-STAT SUGAR FREE PO LIQD
30.0000 mL | Freq: Every day | ORAL | Status: DC
  Administered 2018-05-17 – 2018-05-24 (×8): 30 mL
  Filled 2018-05-17 (×8): qty 30

## 2018-05-17 MED ORDER — MIDAZOLAM HCL 2 MG/2ML IJ SOLN
1.0000 mg | INTRAMUSCULAR | Status: DC | PRN
  Administered 2018-05-17 – 2018-05-19 (×6): 1 mg via INTRAVENOUS
  Administered 2018-05-20: 21:00:00 via INTRAVENOUS
  Administered 2018-05-21 – 2018-05-22 (×6): 1 mg via INTRAVENOUS
  Filled 2018-05-17 (×14): qty 2

## 2018-05-17 MED ORDER — VITAL AF 1.2 CAL PO LIQD
1000.0000 mL | ORAL | Status: DC
  Administered 2018-05-17: 1000 mL
  Administered 2018-05-18 – 2018-05-19 (×2): 1500 mL
  Administered 2018-05-20 – 2018-05-22 (×3): 1000 mL
  Filled 2018-05-17 (×7): qty 1000

## 2018-05-17 MED ORDER — ORAL CARE MOUTH RINSE
15.0000 mL | OROMUCOSAL | Status: DC
  Administered 2018-05-17 – 2018-05-23 (×60): 15 mL via OROMUCOSAL

## 2018-05-17 MED ORDER — MIDAZOLAM HCL 2 MG/2ML IJ SOLN
1.0000 mg | INTRAMUSCULAR | Status: AC | PRN
  Administered 2018-05-17 (×3): 1 mg via INTRAVENOUS
  Filled 2018-05-17 (×3): qty 2

## 2018-05-17 MED ORDER — ETOMIDATE 2 MG/ML IV SOLN
INTRAVENOUS | Status: DC | PRN
  Administered 2018-05-17: 6 mg via INTRAVENOUS

## 2018-05-17 NOTE — Progress Notes (Signed)
PHARMACY NOTE:  ANTIMICROBIAL RENAL DOSAGE ADJUSTMENT  Current antimicrobial regimen includes a mismatch between antimicrobial dosage and estimated renal function.  As per policy approved by the Pharmacy & Therapeutics and Medical Executive Committees, the antimicrobial dosage will be adjusted accordingly.  Current antimicrobial dosage: cefepime 2g IV q12h   Renal Function: PNA  Estimated Creatinine Clearance: 46.8 mL/min (A) (by C-G formula based on SCr of 1.02 mg/dL (H)). []      On intermittent HD, scheduled: []      On CRRT    Antimicrobial dosage has been changed to:  cefepime1g IV q12h      Thank you for allowing pharmacy to participate in  this patient's care.  Tama Highamara Valisha Heslin, [facility] CenterRPH 05/17/2018 1:49 PM

## 2018-05-17 NOTE — Progress Notes (Signed)
Initial Nutrition Assessment  DOCUMENTATION CODES:  Not applicable  INTERVENTION:  Initiate TF via OGTwith Vital AF 1.2 at goal rate of 40 ml/h (960 ml per day) and Prostat 30 ml 24 to provide 1252 kcals, 87 gm protein, 779 ml free water daily.  NUTRITION DIAGNOSIS:  Inadequate oral intake related to inability to eat as evidenced by NPO status.  GOAL:  Patient will meet greater than or equal to 90% of their needs  MONITOR:  Vent status, Labs, Weight trends, I & O's, TF tolerance  REASON FOR ASSESSMENT:  Consult, Ventilator Enteral/tube feeding initiation and management  ASSESSMENT:  67 y/o female PMHx PAD, HF, HTN/HLD, CKD3. Recently dxd w/ PNA by PCP and started on ABx. However, d/t still worsening weakness and SOB, she presented to ED. CXR showed multilobar PNA. Admitted for sepsis 2/2 PNA.   Pt showed initial improvement on IV abx, but on 11/17 she decompensated. Transferred to SDU  on11/18 and was ultimately intubated on 11/20 after unsuccessful trial w/ BiPAP.  Per review of pt's intake records this admission, she appears to have had an excellent appetite initially, despite being septic. Documentation shows she consistently ate 75-100% of her meals up until about 11/18, which is when she was transferred to SDU, made NPO and started on BiPAP. Has gone ~48 hrs w/o intake.   On RD arrival, pt intubated and sedated on fentanyl. Pt sister is at bedside and provides all information. She confirms the patient had been eating well up until just a couple days ago. She says PTA, the patients appetite/intake was also at her baseline. She says, at baseline, the patient does not follow any therapeutic diet and knows she takes Vit D, but unsure if there are other vit/mins she takes.   Weight wise, she says the pts UBW is 146-156 lbs. Per chart, the patient presented at 165.6 lbs. Her current listed weight is 163 lbs. Her weight does appear to have been stable  between 143-153 for many years prior  to admission.   Patient is currently intubated on ventilator support MV: 8.9 L/min Temp (24hrs), Avg:98.4 F (36.9 C), Min:98 F (36.7 C), Max:98.8 F (37.1 C) Propofol: None  Though pt's BMI is currently classified as obese, she is believed to be 10-15 lbs about her normal dry weight and when using a weight of 150 (more consistent w/ apparent UBW range in chart) she would not meet obese criteria (bmi=29.4). As such, for kcal/pro estimation, used PSU 2003b with slight adjustments.    Labs: Glu:142, Bun/Creat:21/1.02, BNP:231, albumin:2.0, Na: 135->140 Meds: Methylprednisolone, IVF, IV ABx Sedation/analgesia-fentanyl, PRN versed  Recent Labs  Lab 05/15/18 0801 05/16/18 0607 05/17/18 0413 05/17/18 1026  NA 135 137 140  --   K 4.1 4.1 3.6  --   CL 107 108 109  --   CO2 19* 21* 22  --   BUN 11 14 21   --   CREATININE 0.94 0.97 1.02*  --   CALCIUM 8.2* 8.2* 8.3*  --   MG  --   --   --  2.1  PHOS  --   --   --  4.0  GLUCOSE 124* 141* 142*  --     NUTRITION - FOCUSED PHYSICAL EXAM:   Most Recent Value  Orbital Region  No depletion  Upper Arm Region  No depletion  Thoracic and Lumbar Region  No depletion  Buccal Region  No depletion  Temple Region  No depletion  Clavicle Bone Region  No depletion  Clavicle and Acromion Bone Region  No depletion  Scapular Bone Region  No depletion  Dorsal Hand  No depletion  Patellar Region  No depletion  Anterior Thigh Region  No depletion  Posterior Calf Region  No depletion  Edema (RD Assessment)  None  Hair  Reviewed  Eyes  Reviewed  Mouth  Reviewed  Skin  Reviewed  Nails  Reviewed     Diet Order:   Diet Order            Diet NPO time specified  Diet effective now             EDUCATION NEEDS:  No education needs have been identified at this time  Skin:  Skin Assessment: Reviewed RN Assessment  Last BM:  11/17  Height:  Ht Readings from Last 1 Encounters:  05/15/18 5' (1.524 m)   Weight:  Wt Readings from Last 1  Encounters:  05/17/18 74 kg   Wt Readings from Last 10 Encounters:  05/17/18 74 kg  03/27/18 68.5 kg  05/05/17 68.5 kg  04/22/16 65.2 kg  05/19/15 64.9 kg  12/27/14 65.9 kg  11/09/13 67.1 kg  11/22/12 70.8 kg  04/28/12 69.4 kg   Ideal Body Weight:  45.45 kg  BMI:  Body mass index is 31.86 kg/m.  Estimated Nutritional Needs:  Kcal:  1250 kcals (PSU 2003b -100 kcal adjustment) Protein:  77-91 g  (1.7-2g/kg ibw) Fluid:  Per MDs discretion   Christophe Louis RD, LDN, CNSC Clinical Nutrition Available Tues-Sat via Pager: [phone] 05/17/2018 2:57 PM

## 2018-05-17 NOTE — Progress Notes (Signed)
Peripherally Inserted Central Catheter/Midline Placement  The IV Nurse has discussed with the patient and/or persons authorized to consent for the patient, the purpose of this procedure and the potential benefits and risks involved with this procedure.  The benefits include less needle sticks, lab draws from the catheter, and the patient may be discharged home with the catheter. Risks include, but not limited to, infection, bleeding, blood clot (thrombus formation), and puncture of an artery; nerve damage and irregular heartbeat and possibility to perform a PICC exchange if needed/ordered by physician.  Alternatives to this procedure were also discussed.  Bard Power PICC patient education guide, fact sheet on infection prevention and patient information card has been provided to patient /or left at bedside.    PICC/Midline Placement Documentation  PICC Double Lumen 05/17/18 PICC Right Basilic 37 cm 0 cm (Active)  Indication for Insertion or Continuance of Line Chronic illness with exacerbations (CF, Sickle Cell, etc.) 05/17/2018  3:56 PM  Exposed Catheter (cm) 0 cm 05/17/2018  3:56 PM  Site Assessment Clean;Dry;Intact 05/17/2018  3:56 PM  Lumen #1 Status Flushed;No blood return 05/17/2018  3:56 PM  Lumen #2 Status Flushed;No blood return 05/17/2018  3:56 PM  Dressing Type Transparent 05/17/2018  3:56 PM  Dressing Status Clean;Dry;Intact;Antimicrobial disc in place 05/17/2018  3:56 PM  Dressing Intervention New dressing 05/17/2018  3:56 PM  Dressing Change Due 05/24/18 05/17/2018  3:56 PM   Husband signed consent at bedside    Maximino GreenlandLumban, Naydelin Ziegler Albarece 05/17/2018, 3:57 PM

## 2018-05-17 NOTE — Anesthesia Procedure Notes (Signed)
Procedure Name: Intubation Performed by: Lenice Llamas, MD Pre-anesthesia Checklist: Patient identified, Patient being monitored, Timeout performed, Emergency Drugs available and Suction available Patient Re-evaluated:Patient Re-evaluated prior to induction Oxygen Delivery Method: Circle System Utilized and Ambu bag Preoxygenation: Pre-oxygenation with 100% oxygen Induction Type: IV induction Ventilation: Mask ventilation without difficulty Laryngoscope Size: Mac and 4 Grade View: Grade I Tube type: Oral Tube size: 7.0 mm Number of attempts: 1 Airway Equipment and Method: Stylet Placement Confirmation: ETT inserted through vocal cords under direct vision,  positive ETCO2,  breath sounds checked- equal and bilateral and CO2 detector Secured at: 20 cm Tube secured with: Tape Dental Injury: Teeth and Oropharynx as per pre-operative assessment  Comments: CTSP in ICU for ETT for impending resp failure. Sats ~90 on Bipap. Primary MD (TAT) at bedside throughout. Pt given amidate and sux, then Easy ETT x1. Quick desaturation which resolved after Ventilator support. Please manage per primary MD. Prognosis guarded.

## 2018-05-17 NOTE — Progress Notes (Addendum)
         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 She had gone to her PCP a few days prior for a week of fevers, chills, increasing cough, and diagnosed with pneumonia, started on doxycycline and nebulized breathing treatments at home but has had no improvement. Over the past 24 hours she's grown increasingly weak, getting up out of bed rarely, much more short of breath when she attempts this and constantly feels like she's about to pass out. On initial evaluation she was afebrile, but was 100.7Fat home prior to taking tylenol. Tachycardic with soft BP, hypoxic to 88% on room air, improved with 2L O2, tachypneic. WBC 11.3k, flu negative, lactic acid elevated. Levaquin and IV fluids given after blood cultures drawn.She continued to be hypoxic and developed wheezing for which steroids and breathing treatments were provided. Respiratory effort increased and hypoxia worsened prompting pulmonology consultation and transfer to SDU/ICU on 11/18. BiPAP started with improvement in oxygenation.  The patient did not show any improvement on BiPAP.  She was intubated on 05/17/18.  Assessment/Plan: Acute hypoxic respiratory failure:  -Due to multifocal pneumonia, bronchospasm. Very likely also an element of interstitial edema. - Continue broad antimicrobial coverage, procalcitonin undetectable. Stopped vancomycin as she failed an adequate trial of doxycycline, MRSA negative. - increase solumedrol to 60 IV q 6 - Intubated 05/17/18 - received lasix 11/18 and 11/19  Sepsis due to multilobar pneumonia:  -Flu negative. - Continue broad antibiotics. PCT reassuring at 0.19 > 0.16 > 0.13 and undetectable  on 11/18. - Blood cultures negative. - tracheal aspirate for culture  COPD Exacerbation -pt has at least 20 pack year history of tobacco -increase solumedrol -add pulmicort -continue bronchodilators  FEN -place OG -start enteral feeding -check mag  -check phos  HTN:  -Currently soft blood pressures.  - Continue to hold lisinopril  PVCs:  -Troponin negative, no ectopy on ECG. - Holding verapamil due to soft BPs  Hypothyroidism:  - Continue home variable synthroid dosing. -change synthroid to IV  Thrombocytosis:  -Reactive, monitor.  Leukocytosis:  -Difficult to distinguish between infection vs. steroid-induced.  - Continue monitoring. -pt remains afebrile -repeat procalcitonin  Hyponatremia, hypochloremia:  -Resolved.   Stage III CKD:  -Creatinine at baseline on admission. Had lasix, vancomycin, and contrast but seems to be stable. - Continues to be stable, will monitor with redosing lasix. -baseline creatinine 0.8-1.1  Carotid stenosis:  -Continue QOD ASA.  Hyperlipidemia:  -Continue statin    Disposition Plan:   Transfer to ICU Family Communication:   Family at bedside updated  Consultants:  pulmonary  Code Status:  FULL   DVT Prophylaxis:  Lovenox   Procedures: As Listed in Progress Note Above  Antibiotics: vanco 11/18 levoflox 11/14>>> Cefepime 11/18>>>   The patient is critically ill with multiple organ systems failure and requires high complexity decision making for assessment and support, frequent evaluation and titration of therapies, application of advanced monitoring technologies and extensive interpretation of multiple databases.  Critical care time - 45 mins.     Subjective: Pt feels sob is only slightly better.  Still has chest congestion and cough.  No hemoptysis.  Denies f/c, cp, n/v/d,abd pain, dysuria  Objective: Vitals:   05/17/18 0600 05/17/18   0700 05/17/18 0813 05/17/18 0854  BP: (!) 90/41 103/72      Pulse: 85 91    Resp: (!) 37 (!) 22    Temp:   98 F (36.7 C)   TempSrc:   Oral   SpO2: 96% 92%  94%  Weight:      Height:        Intake/Output Summary (Last 24 hours) at 05/17/2018 1018 Last data filed at 05/17/2018 0542 Gross per 24 hour  Intake 437 ml  Output 1100 ml  Net -663 ml   Weight change: -1.1 kg Exam:   General:  Pt is alert, follows commands appropriately prior to intubation  HEENT: No icterus, No thrush, No neck mass, [place]/AT  Cardiovascular: RRR, S1/S2, no rubs, no gallops  Respiratory: bilateral scattered rhonchi and wheeze  Abdomen: Soft/+BS, non tender, non distended, no guarding  Extremities: No edema, No lymphangitis, No petechiae, No rashes, no synovitis   Data Reviewed: I have personally reviewed following labs and imaging studies Basic Metabolic Panel: Recent Labs  Lab 05/12/18 0618 05/13/18 0633 05/15/18 0801 05/16/18 0607 05/17/18 0413  NA 131* 132* 135 137 140  K 4.1 3.9 4.1 4.1 3.6  CL 104 106 107 108 109  CO2 20* 20* 19* 21* 22  GLUCOSE 94 96 124* 141* 142*  BUN 9 7* 11 14 21  CREATININE 1.00 0.86 0.94 0.97 1.02*  CALCIUM 7.8* 7.4* 8.2* 8.2* 8.3*   Liver Function Tests: Recent Labs  Lab 05/15/18 0801  AST 30  ALT 16  ALKPHOS 73  BILITOT 0.4  PROT 6.2*  ALBUMIN 2.0*   No results for input(s): LIPASE, AMYLASE in the last 168 hours. No results for input(s): AMMONIA in the last 168 hours. Coagulation Profile: No results for input(s): INR, PROTIME in the last 168 hours. CBC: Recent Labs  Lab 05/12/2018 1152 05/12/18 0618 05/13/18 0633 05/15/18 0801 05/16/18 0607 05/17/18 0413  WBC 11.3* 9.9 11.8* 26.4* 20.1* 19.3*  NEUTROABS 8.4*  --   --   --   --   --   HGB 11.3* 9.5* 9.6* 9.6* 9.2* 8.8*  HCT 34.8* 30.1* 29.9* 30.4* 28.8* 27.7*  MCV 89.0 89.3 91.4 92.7 89.4 87.7  PLT 525* 417* 432* 576* 501* 427*   Cardiac Enzymes: Recent Labs  Lab 05/22/2018 1152  TROPONINI <0.03   BNP: Invalid input(s): POCBNP CBG: No  results for input(s): GLUCAP in the last 168 hours. HbA1C: No results for input(s): HGBA1C in the last 72 hours. Urine analysis:    Component Value Date/Time   COLORURINE YELLOW 05/06/2018 1152   APPEARANCEUR CLEAR 05/21/2018 1152   LABSPEC 1.010 04/28/2018 1152   PHURINE 6.0 05/20/2018 1152   GLUCOSEU NEGATIVE 05/01/2018 1152   HGBUR NEGATIVE 05/13/2018 1152   BILIRUBINUR NEGATIVE 05/13/2018 1152   KETONESUR NEGATIVE 05/23/2018 1152   PROTEINUR NEGATIVE 05/10/2018 1152   UROBILINOGEN 0.2 01/07/2010 1316   NITRITE NEGATIVE 04/29/2018 1152   LEUKOCYTESUR TRACE (A) 05/17/2018 1152   Sepsis Labs: @LABRCNTIP(procalcitonin:4,lacticidven:4) ) Recent Results (from the past 240 hour(s))  Urine culture     Status: None   Collection Time: 05/22/2018 11:52 AM  Result Value Ref Range Status   Specimen Description   Final    URINE, RANDOM Performed at [facility], [address] [place], [place] [address]    Special Requests   Final    NONE Performed at [facility], [address] [place],  [address]    Culture   Final      NO GROWTH Performed at [facility], [address] [place], [place] [address]    Report Status 05/12/2018 FINAL  Final  Culture, blood (routine x 2)     Status: None   Collection Time: 04/28/2018 12:38 PM  Result Value Ref Range Status   Specimen Description BLOOD RIGHT ARM  Final   Special Requests   Final    BOTTLES DRAWN AEROBIC AND ANAEROBIC Blood Culture adequate volume   Culture   Final    NO GROWTH 5 DAYS Performed at [facility], [address] [place], [place] [address]    Report Status 05/16/2018 FINAL  Final  Culture, blood (routine x 2)     Status: None   Collection Time: 04/28/2018 12:38 PM  Result Value Ref Range Status   Specimen Description BLOOD RIGHT HAND  Final   Special Requests   Final    BOTTLES DRAWN AEROBIC AND ANAEROBIC Blood Culture adequate volume   Culture   Final    NO GROWTH 5 DAYS Performed at [facility], [address] [place], [place] [address]    Report Status 05/16/2018 FINAL  Final  MRSA PCR Screening     Status: None   Collection Time: 05/15/18  7:17 AM  Result Value Ref Range Status   MRSA by PCR NEGATIVE NEGATIVE Final    Comment:        The GeneXpert MRSA Assay (FDA approved for NASAL specimens only), is one component of a comprehensive MRSA colonization surveillance program. It is not intended to diagnose MRSA infection nor to guide or monitor treatment for MRSA infections. Performed at [facility], [address] [place],  [address]      Scheduled Meds: . acetylcysteine  3 mL Nebulization Q6H  . chlorhexidine gluconate (MEDLINE KIT)  15 mL Mouth Rinse BID  . enoxaparin (LOVENOX) injection  40 mg Subcutaneous Q24H  . ipratropium-albuterol  3 mL Nebulization Q6H WA  . [START ON 05/18/2018] levothyroxine  37.5 mcg Intravenous Daily  . mouth rinse  15 mL Mouth Rinse 10 times per day  . methylPREDNISolone (SOLU-MEDROL) injection  60 mg Intravenous Q6H  . sodium chloride flush  3 mL Intravenous Q12H   Continuous Infusions: . sodium chloride 10 mL/hr at 05/17/18 0542  . ceFEPime (MAXIPIME) IV Stopped (05/16/18 2132)  . levofloxacin (LEVAQUIN) IV 750 mg (05/17/18 0803)    Procedures/Studies: Dg Chest 2 View  Result Date: 05/13/2018 CLINICAL DATA:  Recent pneumonia with persistent cough, fever, and shortness of breath. EXAM: CHEST - 2 VIEW COMPARISON:  May 08, 2018 FINDINGS: Extensive airspace disease is noted throughout much of the right upper and lower lobe regions. There is also patchy infiltrate in the left lower lobe. The degree of consolidation on the right is stable. There is slight the increased consolidation in the left base. Heart size and pulmonary vascularity are normal. No adenopathy. No bone lesions. IMPRESSION: Multifocal pneumonia. Areas of airspace opacity in the right upper and lower lobes appears essentially stable compared to 3 days prior.  Increase in infiltrate left base. Stable cardiac silhouette. No adenopathy evident. Electronically Signed   By: William  Woodruff III M.D.   On: 05/24/2018 12:57   Dg Chest 2 View  Result Date: 05/08/2018 CLINICAL DATA:  Productive cough and fever for a couple weeks, former smoker, history hypertension, cardiomyopathy EXAM: CHEST - 2 VIEW COMPARISON:  None FINDINGS: Normal heart size, mediastinal contours, and pulmonary vascularity. Scattered opacities throughout the RIGHT lung likely representing pneumonia. However   these are somewhat patchy/nodular in appearance and require follow-up until resolution to exclude underlying abnormalities including tumor. Minimal atelectasis versus consolidation in LEFT lower lobe. Remaining LEFT lung clear. Elevation of RIGHT diaphragm. No pleural effusion or pneumothorax. IMPRESSION: Predominately RIGHT-sided pulmonary infiltrates consistent with pneumonia. Followup PA and lateral chest X-ray is recommended in 3-4 weeks following trial of antibiotic therapy to ensure resolution and exclude underlying malignancy. These results will be called to the ordering clinician or representative by the Radiologist Assistant, and communication documented in the PACS or zVision Dashboard. Electronically Signed   By: Lavonia Dana M.D.   On: 05/08/2018 11:45   Ct Angio Chest Pe W Or Wo Contrast  Result Date: 05/14/2018 CLINICAL DATA:  Shortness of breath worsening today. Clinical concern for pulmonary embolus. Positive D-dimer. EXAM: CT ANGIOGRAPHY CHEST WITH CONTRAST TECHNIQUE: Multidetector CT imaging of the chest was performed using the standard protocol during bolus administration of intravenous contrast. Multiplanar CT image reconstructions and MIPs were obtained to evaluate the vascular anatomy. CONTRAST:  1m ISOVUE-370 IOPAMIDOL (ISOVUE-370) INJECTION 76% COMPARISON:  None FINDINGS: Cardiovascular: The heart size is normal. No substantial pericardial effusion. No thoracic aortic  aneurysm. No filling defect in the opacified pulmonary arteries to suggest the presence of an acute pulmonary embolus. Mediastinum/Nodes: 14 mm short axis subcarinal lymph node is associated with mild bilateral hilar lymphadenopathy. The esophagus has normal imaging features. There is no axillary lymphadenopathy. Lungs/Pleura: The central tracheobronchial airways are patent. Asymmetric patchy airspace disease, right greater than left is associated with interlobular septal thickening. Confluent airspace disease noted in the right lower lobe posterior left lower lobe. Small to moderate right pleural effusion evident with small left pleural effusion. Upper Abdomen: The liver shows diffusely decreased attenuation suggesting steatosis. Musculoskeletal: No worrisome lytic or sclerotic osseous abnormality. Review of the MIP images confirms the above findings. IMPRESSION: 1. No CT evidence for acute pulmonary embolus. 2. Bilateral patchy, right greater than left airspace disease compatible with multifocal pneumonia. 3. Mild mediastinal and bilateral hilar lymphadenopathy. 4. Small to moderate bilateral pleural effusions.  The Electronically Signed   By: EMisty StanleyM.D.   On: 05/14/2018 17:35   Portable Chest X-ray  Result Date: 05/17/2018 CLINICAL DATA:  Evaluate endotracheal tube placement EXAM: PORTABLE CHEST 1 VIEW COMPARISON:  Portable chest x-ray of 05/17/2017 FINDINGS: Endotracheal tube has now been placed with the tip approximately 2.7 cm above the carina. There is little change in patchy lung opacities greatest in the right mid and upper lung which appears most typical of pneumonia. However as noted on the prior dictation, superimposed edema cannot be excluded. Mild cardiomegaly is stable. IMPRESSION: 1. Tip of endotracheal tube 2.7 cm above the carina. 2. Little change in lung opacities as noted previously suspicious for pneumonia and possibly superimposed edema. Electronically Signed   By: PIvar DrapeM.D.    On: 05/17/2018 10:12   Dg Chest Port 1 View  Result Date: 05/17/2018 CLINICAL DATA:  Respiratory failure EXAM: PORTABLE CHEST 1 VIEW COMPARISON:  05/16/2018 FINDINGS: Right upper lobe and left lower lobe opacities, suspicious for pneumonia. Superimposed interstitial opacities, suspicious for interstitial edema. No definite pleural effusions. The heart is normal in size. No pneumothorax. IMPRESSION: Multifocal opacities, right upper lobe predominant, suspicious for pneumonia. Superimposed interstitial edema is also suspected. Overall appearance is similar to the prior. Electronically Signed   By: SJulian HyM.D.   On: 05/17/2018 09:12   Dg Chest Port 1 View  Result Date: 05/16/2018 CLINICAL DATA:  Respiratory  failure EXAM: PORTABLE CHEST 1 VIEW COMPARISON:  CTA chest dated 05/14/2018 FINDINGS: Multifocal patchy opacities, right upper lobe predominant, suspicious for pneumonia. Superimposed mild interstitial edema is possible. No definite pleural effusion. No pneumothorax. The heart is normal in size. IMPRESSION: Multifocal patchy opacities, right upper lobe predominant, suspicious for pneumonia. Superimposed mild interstitial edema is possible. These findings are better evaluated on recent CT. Electronically Signed   By: Sriyesh  Krishnan M.D.   On: 05/16/2018 09:32    David Tat, DO  [facility] Pager [phone]  If 7PM-7AM, please contact night-coverage [email or web address] Password [email or web address] 05/17/2018, 10:18 AM   LOS: 5 days   

## 2018-05-17 NOTE — Progress Notes (Signed)
Subjective: She remains on BiPAP.  When BiPAP was taken off she dropped her O2 sat into the 60s.  She is coughing but nonproductively.  She feels like she has sputum but she cannot get it to move.  No other new complaints  Objective: Vital signs in last 24 hours: Temp:  [98 F (36.7 C)-98.7 F (37.1 C)] 98 F (36.7 C) (11/20 0813) Pulse Rate:  [85-117] 91 (11/20 0700) Resp:  [18-37] 22 (11/20 0700) BP: (90-140)/(41-72) 103/72 (11/20 0700) SpO2:  [89 %-98 %] 94 % (11/20 0854) FiO2 (%):  [100 %] 100 % (11/20 0854) Weight:  [74 kg] 74 kg (11/20 0426) Weight change: -1.1 kg Last BM Date: 05/14/18  Intake/Output from previous day: 11/19 0701 - 11/20 0700 In: 437 [I.V.:237; IV Piggyback:200] Out: 1100 [Urine:1100]  PHYSICAL EXAM General appearance: alert, cooperative and moderate distress Resp: Bilateral rales.  On BiPAP Cardio: regular rate and rhythm, S1, S2 normal, no murmur, click, rub or gallop GI: soft, non-tender; bowel sounds normal; no masses,  no organomegaly Extremities: extremities normal, atraumatic, no cyanosis or edema  Lab Results:  Results for orders placed or performed during the hospital encounter of 05/16/2018 (from the past 48 hour(s))  Blood gas, arterial     Status: Abnormal   Collection Time: 05/15/18  3:12 PM  Result Value Ref Range   FIO2 100.00    Delivery systems HI FLOW NASAL CANNULA    pH, Arterial 7.410 7.350 - 7.450   pCO2 arterial 33.8 32.0 - 48.0 mmHg   pO2, Arterial 60.3 (L) 83.0 - 108.0 mmHg   Bicarbonate 22.1 20.0 - 28.0 mmol/L   Acid-base deficit 2.9 (H) 0.0 - 2.0 mmol/L   O2 Saturation 89.8 %   Patient temperature 37.0    Collection site RIGHT RADIAL    Drawn by [phone]    Sample type ARTERIAL DRAW    Allens test (pass/fail) PASS PASS    Comment: Performed at [facility], [address] [place], [place] [address]  Blood gas, arterial     Status: Abnormal   Collection Time: 05/15/18  8:25 PM  Result Value Ref Range   FIO2 100.00    Delivery systems BILEVEL POSITIVE AIRWAY PRESSURE    Inspiratory PAP 10.0    Expiratory PAP 5.0    pH, Arterial 7.411 7.350 - 7.450   pCO2 arterial 34.5 32.0 - 48.0 mmHg   pO2, Arterial 73.7 (L) 83.0 - 108.0 mmHg   Bicarbonate 22.6 20.0 - 28.0 mmol/L   Acid-base deficit 2.4 (H) 0.0 - 2.0 mmol/L   O2 Saturation 93.9 %   Patient temperature 37.0    Collection site RIGHT RADIAL    Drawn by [phone]    Sample type ARTERIAL    Allens test (pass/fail) PASS PASS    Comment: Performed at [facility], [address], [place], [place] [address]  CBC     Status: Abnormal   Collection Time: 05/16/18  6:07 AM  Result Value Ref Range   WBC 20.1 (H) 4.0 - 10.5 K/uL   RBC 3.22 (L) 3.87 - 5.11 MIL/uL   Hemoglobin 9.2 (L) 12.0 - 15.0 g/dL   HCT 28.8 (L) 36.0 - 46.0 %   MCV 89.4 80.0 - 100.0 fL   MCH 28.6 26.0 - 34.0 pg   MCHC 31.9 30.0 - 36.0 g/dL   RDW 13.6 11.5 - 15.5 %   Platelets 501 (H) 150 - 400 K/uL   nRBC 0.0 0.0 - 0.2 %    Comment:  Performed at [facility], [address], [place], [place] [address]  Basic metabolic panel     Status: Abnormal   Collection Time: 05/16/18  6:07 AM  Result Value Ref Range   Sodium 137 135 - 145 mmol/L   Potassium 4.1 3.5 - 5.1 mmol/L   Chloride 108 98 - 111 mmol/L   CO2 21 (L) 22 - 32 mmol/L   Glucose, Bld 141 (H) 70 - 99 mg/dL   BUN 14 8 - 23 mg/dL   Creatinine, Ser 0.97 0.44 - 1.00 mg/dL   Calcium 8.2 (L) 8.9 - 10.3 mg/dL   GFR calc non Af Amer 58 (L) >60 mL/min   GFR calc Af Amer >60 >60 mL/min    Comment: (NOTE) The eGFR has been calculated using the CKD EPI equation. This calculation has not been validated in all clinical situations. eGFR's persistently <60 mL/min signify possible Chronic Kidney Disease.    Anion gap 8 5 - 15    Comment: Performed at [facility], [address] [place], [place] [address]  Blood gas, arterial     Status: Abnormal   Collection Time: 05/16/18  9:36 AM  Result Value Ref Range   FIO2 100.00    Delivery systems  BILEVEL POSITIVE AIRWAY PRESSURE    LHR 10 resp/min   Inspiratory PAP 10    Expiratory PAP 5.0    pH, Arterial 7.420 7.350 - 7.450   pCO2 arterial 33.4 32.0 - 48.0 mmHg   pO2, Arterial 71.1 (L) 83.0 - 108.0 mmHg   Bicarbonate 22.5 20.0 - 28.0 mmol/L   Acid-base deficit 2.5 (H) 0.0 - 2.0 mmol/L   O2 Saturation 93.4 %   Patient temperature 37.0    Collection site RIGHT RADIAL    Drawn by [phone]    Sample type ARTERIAL DRAW    Allens test (pass/fail) PASS PASS    Comment: Performed at [facility], [address] [place], [place] [address]  CBC     Status: Abnormal   Collection Time: 05/17/18  4:13 AM  Result Value Ref Range   WBC 19.3 (H) 4.0 - 10.5 K/uL   RBC 3.16 (L) 3.87 - 5.11 MIL/uL   Hemoglobin 8.8 (L) 12.0 - 15.0 g/dL   HCT 27.7 (L) 36.0 - 46.0 %   MCV 87.7 80.0 - 100.0 fL   MCH 27.8 26.0 - 34.0 pg   MCHC 31.8 30.0 - 36.0 g/dL   RDW 13.7 11.5 - 15.5 %   Platelets 427 (H) 150 - 400 K/uL   nRBC 0.0 0.0 - 0.2 %    Comment: Performed at [facility], [address] [place], [place] [address]  Basic metabolic panel     Status: Abnormal   Collection Time: 05/17/18  4:13 AM  Result Value Ref Range   Sodium 140 135 - 145 mmol/L   Potassium 3.6 3.5 - 5.1 mmol/L   Chloride 109 98 - 111 mmol/L   CO2 22 22 - 32 mmol/L   Glucose, Bld 142 (H) 70 - 99 mg/dL   BUN 21 8 - 23 mg/dL   Creatinine, Ser 1.02 (H) 0.44 - 1.00 mg/dL   Calcium 8.3 (L) 8.9 - 10.3 mg/dL   GFR calc non Af Amer 55 (L) >60 mL/min   GFR calc Af Amer >60 >60 mL/min    Comment: (NOTE) The eGFR has been calculated using the CKD EPI equation. This calculation has not been validated in all clinical situations. eGFR's persistently <60 mL/min signify possible Chronic  Kidney Disease.    Anion gap 9 5 - 15    Comment: Performed at [facility], [address], [place], [place] [address]    ABGS Recent Labs    05/16/18 0936  PHART 7.420  PO2ART 71.1*  HCO3 22.5   CULTURES Recent Results (from the past 240  hour(s))  Urine culture     Status: None   Collection Time: 05/23/2018 11:52 AM  Result Value Ref Range Status   Specimen Description   Final    URINE, RANDOM Performed at [facility], [address] [place], [place] [address]    Special Requests   Final    NONE Performed at [facility], [address], [place], [place] [address]    Culture   Final    NO GROWTH Performed at [facility], [place] [address], [place], [place] [address]    Report Status 05/12/2018 FINAL  Final  Culture, blood (routine x 2)     Status: None   Collection Time: 05/10/2018 12:38 PM  Result Value Ref Range Status   Specimen Description BLOOD RIGHT ARM  Final   Special Requests   Final    BOTTLES DRAWN AEROBIC AND ANAEROBIC Blood Culture adequate volume   Culture   Final    NO GROWTH 5 DAYS Performed at [facility], [address], [place], [place] [address]    Report Status 05/16/2018 FINAL  Final  Culture, blood (routine x 2)     Status: None   Collection Time: 05/17/2018 12:38 PM  Result Value Ref Range Status   Specimen Description BLOOD RIGHT HAND  Final   Special Requests   Final    BOTTLES DRAWN AEROBIC AND ANAEROBIC Blood Culture adequate volume   Culture   Final    NO GROWTH 5 DAYS Performed at [facility], [address], [place], [place] [address]    Report Status 05/16/2018 FINAL  Final  MRSA PCR Screening     Status: None   Collection Time: 05/15/18  7:17 AM  Result Value Ref Range Status   MRSA by PCR NEGATIVE NEGATIVE Final    Comment:        The GeneXpert MRSA Assay (FDA approved for NASAL specimens only), is one component of a comprehensive MRSA colonization surveillance program. It is not intended to diagnose MRSA infection nor to guide or monitor treatment for MRSA infections. Performed at [facility], [address] [place], [place] [address]    Studies/Results: Dg Chest Port 1 View  Result Date: 05/16/2018 CLINICAL DATA:  Respiratory failure EXAM: PORTABLE CHEST 1  VIEW COMPARISON:  CTA chest dated 05/14/2018 FINDINGS: Multifocal patchy opacities, right upper lobe predominant, suspicious for pneumonia. Superimposed mild interstitial edema is possible. No definite pleural effusion. No pneumothorax. The heart is normal in size. IMPRESSION: Multifocal patchy opacities, right upper lobe predominant, suspicious for pneumonia. Superimposed mild interstitial edema is possible. These findings are better evaluated on recent CT. Electronically Signed   By: Julian Hy M.D.   On: 05/16/2018 09:32    Medications:  Prior to Admission:  Medications Prior to Admission  Medication Sig Dispense Refill Last Dose  . ALPRAZolam (XANAX) 0.5 MG tablet Take 0.5 mg by mouth daily as needed.    Past Month at Unknown time  . aspirin 81 MG tablet Take 81 mg by mouth every other day.    Past Week at Unknown time  . cetirizine (ZYRTEC ALLERGY) 10 MG tablet Take 10 mg by mouth daily.  Past Week at Unknown time  . doxycycline (VIBRA-TABS) 100 MG tablet Take 100 mg by mouth 2 (two) times daily.   05/10/2018 at 1800  . levothyroxine (SYNTHROID, LEVOTHROID) 50 MCG tablet Take 50 mcg by mouth as directed. Patient takes 2mg on Saturday and Sunday; all other days she takes 764m   05/07/2018 at 1000  . levothyroxine (SYNTHROID, LEVOTHROID) 75 MCG tablet Take 75 mcg by mouth as directed. Patient takes 7524mevery day except Saturday and Sunday; on Saturday and Sunday, she takes 14m11m 04/28/2018 at 1000  . lisinopril (PRINIVIL,ZESTRIL) 20 MG tablet TAKE 1/2 TABLET BY MOUTH ONCE DAILY. 45 tablet 3 05/10/2018 at 1800  . pravastatin (PRAVACHOL) 40 MG tablet TAKE ONE TABLET BY MOUTH IN THE EVENING. 90 tablet 2 05/10/2018 at 1800  . Pseudoephedrine-Guaifenesin (MUCINEX D MAX STRENGTH) [phone] MG TB12 Take 1 tablet by mouth daily.   05/10/2018 at Unknown time  . verapamil (VERELAN PM) 240 MG 24 hr capsule TAKE (1) CAPSULE BY MOUTH AT BEDTIME. 30 capsule 2 05/10/2018 at 1800  . zolpidem  (AMBIEN) 10 MG tablet Take 10 mg by mouth at bedtime.    05/10/2018 at Unknown time   Scheduled: . acetylcysteine  3 mL Nebulization Q6H  . aspirin  81 mg Oral QODAY  . enoxaparin (LOVENOX) injection  40 mg Subcutaneous Q24H  . pseudoephedrine  120 mg Oral Daily   And  . guaiFENesin  1,200 mg Oral Daily  . ipratropium-albuterol  3 mL Nebulization Q6H WA  . levothyroxine  75 mcg Oral Once per day on Mon Tue Wed Thu Fri  . loratadine  10 mg Oral Daily  . mouth rinse  15 mL Mouth Rinse BID  . methylPREDNISolone (SOLU-MEDROL) injection  40 mg Intravenous Q12H  . pravastatin  40 mg Oral QPM  . sodium chloride flush  3 mL Intravenous Q12H  . verapamil  240 mg Oral QHS   Continuous: . sodium chloride 10 mL/hr at 05/17/18 0542  . ceFEPime (MAXIPIME) IV Stopped (05/16/18 2132)  . levofloxacin (LEVAQUIN) IV 750 mg (05/17/18 0803)   PRN:ZWC:HENIDPoride, acetaminophen **OR** acetaminophen, albuterol, LORazepam, ondansetron **OR** ondansetron (ZOFRAN) IV, zolpidem  Assesment: She was admitted with acute hypoxic respiratory failure on the basis of multi focal pneumonia.  She had initially done fairly well then developed increasing shortness of breath and hypoxia was transferred to stepdown and started on BiPAP.  Her oxygenation is better on BiPAP.  Consideration was made for intubation and mechanical ventilation but patient and her family wanted to try BiPAP first.  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  She is received Lasix and has had some good diuresis from that.  I will plan to check another chest x-ray today. 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  Still critically ill.  High risk of needing intubation and  mechanical ventilation.  Continue antibiotics.  Check blood gas and chest x-ray today.    LOS: 5 days   Mckennon Zwart L 05/17/2018, 9:07 AM

## 2018-05-18 ENCOUNTER — Inpatient Hospital Stay (HOSPITAL_COMMUNITY): Payer: PPO

## 2018-05-18 LAB — BLOOD GAS, ARTERIAL
ACID-BASE DEFICIT: 1.6 mmol/L (ref 0.0–2.0)
Bicarbonate: 22.8 mmol/L (ref 20.0–28.0)
DRAWN BY: 382351
FIO2: 100
O2 SAT: 98.2 %
PEEP/CPAP: 8 cmH2O
PH ART: 7.342 — AB (ref 7.350–7.450)
PO2 ART: 126 mmHg — AB (ref 83.0–108.0)
Patient temperature: 37
RATE: 18 resp/min
VT: 360 mL
pCO2 arterial: 44.1 mmHg (ref 32.0–48.0)

## 2018-05-18 LAB — GLUCOSE, CAPILLARY
GLUCOSE-CAPILLARY: 145 mg/dL — AB (ref 70–99)
GLUCOSE-CAPILLARY: 160 mg/dL — AB (ref 70–99)
GLUCOSE-CAPILLARY: 168 mg/dL — AB (ref 70–99)
GLUCOSE-CAPILLARY: 187 mg/dL — AB (ref 70–99)
Glucose-Capillary: 172 mg/dL — ABNORMAL HIGH (ref 70–99)
Glucose-Capillary: 164 mg/dL — ABNORMAL HIGH (ref 70–99)

## 2018-05-18 LAB — COMPREHENSIVE METABOLIC PANEL
ALK PHOS: 87 U/L (ref 38–126)
ALT: 22 U/L (ref 0–44)
ANION GAP: 7 (ref 5–15)
AST: 21 U/L (ref 15–41)
Albumin: 1.9 g/dL — ABNORMAL LOW (ref 3.5–5.0)
BUN: 32 mg/dL — ABNORMAL HIGH (ref 8–23)
CALCIUM: 8.6 mg/dL — AB (ref 8.9–10.3)
CHLORIDE: 111 mmol/L (ref 98–111)
CO2: 23 mmol/L (ref 22–32)
CREATININE: 1.21 mg/dL — AB (ref 0.44–1.00)
GFR, EST AFRICAN AMERICAN: 52 mL/min — AB (ref >60)
GFR, EST NON AFRICAN AMERICAN: 45 mL/min — AB (ref >60)
Glucose, Bld: 195 mg/dL — ABNORMAL HIGH (ref 70–99)
Potassium: 4.3 mmol/L (ref 3.5–5.1)
SODIUM: 141 mmol/L (ref 135–145)
Total Bilirubin: 0.5 mg/dL (ref 0.3–1.2)
Total Protein: 5.9 g/dL — ABNORMAL LOW (ref 6.5–8.1)

## 2018-05-18 LAB — CBC
HCT: 28.4 % — ABNORMAL LOW (ref 36.0–46.0)
Hemoglobin: 8.8 g/dL — ABNORMAL LOW (ref 12.0–15.0)
MCH: 27.8 pg (ref 26.0–34.0)
MCHC: 31 g/dL (ref 30.0–36.0)
MCV: 89.9 fL (ref 80.0–100.0)
PLATELETS: 332 10*3/uL (ref 150–400)
RBC: 3.16 MIL/uL — AB (ref 3.87–5.11)
RDW: 14.3 % (ref 11.5–15.5)
WBC: 20.5 10*3/uL — AB (ref 4.0–10.5)
nRBC: 0.1 % (ref 0.0–0.2)

## 2018-05-18 LAB — RESPIRATORY PANEL BY PCR
Adenovirus: NOT DETECTED
BORDETELLA PERTUSSIS-RVPCR: NOT DETECTED
CORONAVIRUS 229E-RVPPCR: NOT DETECTED
Chlamydophila pneumoniae: NOT DETECTED
Coronavirus HKU1: NOT DETECTED
Coronavirus NL63: NOT DETECTED
Coronavirus OC43: NOT DETECTED
INFLUENZA B-RVPPCR: NOT DETECTED
Influenza A: NOT DETECTED
METAPNEUMOVIRUS-RVPPCR: NOT DETECTED
Mycoplasma pneumoniae: NOT DETECTED
PARAINFLUENZA VIRUS 2-RVPPCR: NOT DETECTED
PARAINFLUENZA VIRUS 3-RVPPCR: NOT DETECTED
PARAINFLUENZA VIRUS 4-RVPPCR: NOT DETECTED
Parainfluenza Virus 1: NOT DETECTED
RESPIRATORY SYNCYTIAL VIRUS-RVPPCR: NOT DETECTED
RHINOVIRUS / ENTEROVIRUS - RVPPCR: DETECTED — AB

## 2018-05-18 LAB — PHOSPHORUS: Phosphorus: 3.7 mg/dL (ref 2.5–4.6)

## 2018-05-18 MED ORDER — PANTOPRAZOLE SODIUM 40 MG IV SOLR
40.0000 mg | INTRAVENOUS | Status: DC
  Administered 2018-05-18 – 2018-05-23 (×6): 40 mg via INTRAVENOUS
  Filled 2018-05-18 (×6): qty 40

## 2018-05-18 MED ORDER — IPRATROPIUM BROMIDE 0.02 % IN SOLN
0.5000 mg | Freq: Four times a day (QID) | RESPIRATORY_TRACT | Status: DC
  Administered 2018-05-18 – 2018-05-24 (×24): 0.5 mg via RESPIRATORY_TRACT
  Filled 2018-05-18 (×24): qty 2.5

## 2018-05-18 MED ORDER — ASPIRIN EC 81 MG PO TBEC
81.0000 mg | DELAYED_RELEASE_TABLET | ORAL | Status: DC
  Administered 2018-05-18 – 2018-05-22 (×3): 81 mg via ORAL
  Filled 2018-05-18 (×5): qty 1

## 2018-05-18 MED ORDER — IPRATROPIUM BROMIDE 0.02 % IN SOLN: 0.5000 mg | Freq: Four times a day (QID) | RESPIRATORY_TRACT | Status: DC

## 2018-05-18 MED ORDER — FUROSEMIDE 10 MG/ML IJ SOLN
20.0000 mg | Freq: Once | INTRAMUSCULAR | Status: AC
  Administered 2018-05-18: 20 mg via INTRAVENOUS
  Filled 2018-05-18: qty 2

## 2018-05-18 MED ORDER — LEVALBUTEROL HCL 1.25 MG/0.5ML IN NEBU
1.2500 mg | INHALATION_SOLUTION | Freq: Four times a day (QID) | RESPIRATORY_TRACT | Status: DC
  Administered 2018-05-18 – 2018-05-24 (×24): 1.25 mg via RESPIRATORY_TRACT
  Filled 2018-05-18 (×24): qty 0.5

## 2018-05-18 MED ORDER — SODIUM CHLORIDE 0.9 % IV SOLN
2.0000 g | INTRAVENOUS | Status: DC
  Administered 2018-05-18 – 2018-05-21 (×4): 2 g via INTRAVENOUS
  Filled 2018-05-18 (×5): qty 2

## 2018-05-18 MED ORDER — LEVALBUTEROL HCL 1.25 MG/0.5ML IN NEBU: 1.2500 mg | INHALATION_SOLUTION | Freq: Four times a day (QID) | RESPIRATORY_TRACT | Status: DC

## 2018-05-18 NOTE — Care Management Note (Signed)
Case Management Note  Patient Details  Name: Jasmine Black MRN: [id number] Date of Birth: 06-Mar-1951 If discussed at Long Length of Stay Meetings, dates discussed:  05/18/18  Additional Comments:  Malcolm Metrohildress, Ernesteen Mihalic Demske, RN 05/18/2018, 12:06 PM

## 2018-05-18 NOTE — Progress Notes (Signed)
Subjective: She was intubated and started on mechanical ventilation yesterday.  She is tolerating it okay so far.  She has somewhat better oxygenation but is still requiring 100% oxygen and 8 cm of PEEP.  Chest x-ray essentially unchanged with question of multifocal pneumonia and perhaps some element of volume overload.  Objective: Vital signs in last 24 hours: Temp:  [97.4 F (36.3 C)-98.8 F (37.1 C)] 98.8 F (37.1 C) (11/21 0400) Pulse Rate:  [93-122] 115 (11/21 0800) Resp:  [12-35] 15 (11/21 0800) BP: (102-149)/(47-81) 111/60 (11/21 0800) SpO2:  [72 %-99 %] 98 % (11/21 0844) FiO2 (%):  [80 %-100 %] 80 % (11/21 0844) Weight:  [75.1 kg] 75.1 kg (11/21 0500) Weight change: 1.1 kg Last BM Date: 05/14/18  Intake/Output from previous day: 11/20 0701 - 11/21 0700 In: 1458.5 [I.V.:474.5; NG/GT:634; IV Piggyback:350] Out: 1900 [Urine:1900]  PHYSICAL EXAM General appearance: She is undergoing wake up assessment now when he is awake and able to communicate with gestures Resp: rales bilaterally and rhonchi bilaterally Cardio: regular rate and rhythm, S1, S2 normal, no murmur, click, rub or gallop GI: soft, non-tender; bowel sounds normal; no masses,  no organomegaly Extremities: extremities normal, atraumatic, no cyanosis or edema  Lab Results:  Results for orders placed or performed during the hospital encounter of 05/22/2018 (from the past 48 hour(s))  Blood gas, arterial     Status: Abnormal   Collection Time: 05/16/18  9:36 AM  Result Value Ref Range   FIO2 100.00    Delivery systems BILEVEL POSITIVE AIRWAY PRESSURE    LHR 10 resp/min   Inspiratory PAP 10    Expiratory PAP 5.0    pH, Arterial 7.420 7.350 - 7.450   pCO2 arterial 33.4 32.0 - 48.0 mmHg   pO2, Arterial 71.1 (L) 83.0 - 108.0 mmHg   Bicarbonate 22.5 20.0 - 28.0 mmol/L   Acid-base deficit 2.5 (H) 0.0 - 2.0 mmol/L   O2 Saturation 93.4 %   Patient temperature 37.0    Collection site RIGHT RADIAL    Drawn by [phone]     Sample type ARTERIAL DRAW    Allens test (pass/fail) PASS PASS    Comment: Performed at [facility], [address], [place], [place] [address]  CBC     Status: Abnormal   Collection Time: 05/17/18  4:13 AM  Result Value Ref Range   WBC 19.3 (H) 4.0 - 10.5 K/uL   RBC 3.16 (L) 3.87 - 5.11 MIL/uL   Hemoglobin 8.8 (L) 12.0 - 15.0 g/dL   HCT 27.7 (L) 36.0 - 46.0 %   MCV 87.7 80.0 - 100.0 fL   MCH 27.8 26.0 - 34.0 pg   MCHC 31.8 30.0 - 36.0 g/dL   RDW 13.7 11.5 - 15.5 %   Platelets 427 (H) 150 - 400 K/uL   nRBC 0.0 0.0 - 0.2 %    Comment: Performed at [facility], [address], [place], [place] [address]  Basic metabolic panel     Status: Abnormal   Collection Time: 05/17/18  4:13 AM  Result Value Ref Range   Sodium 140 135 - 145 mmol/L   Potassium 3.6 3.5 - 5.1 mmol/L   Chloride 109 98 - 111 mmol/L   CO2 22 22 - 32 mmol/L   Glucose, Bld 142 (H) 70 - 99 mg/dL   BUN 21 8 - 23 mg/dL   Creatinine, Ser 1.02 (H) 0.44 - 1.00 mg/dL   Calcium 8.3 (L) 8.9 - 10.3 mg/dL   GFR calc  non Af Amer 55 (L) >60 mL/min   GFR calc Af Amer >60 >60 mL/min    Comment: (NOTE) The eGFR has been calculated using the CKD EPI equation. This calculation has not been validated in all clinical situations. eGFR's persistently <60 mL/min signify possible Chronic Kidney Disease.    Anion gap 9 5 - 15    Comment: Performed at [facility], [address] [place], [place] [address]  Blood gas, arterial     Status: Abnormal   Collection Time: 05/17/18  8:58 AM  Result Value Ref Range   FIO2 100.00    Delivery systems BILEVEL POSITIVE AIRWAY PRESSURE    Inspiratory PAP 10    Expiratory PAP 5    pH, Arterial 7.438 7.350 - 7.450   pCO2 arterial 35.5 32.0 - 48.0 mmHg   pO2, Arterial 61.9 (L) 83.0 - 108.0 mmHg   Bicarbonate 24.4 20.0 - 28.0 mmol/L   Acid-base deficit 0.1 0.0 - 2.0 mmol/L   O2 Saturation 90.2 %   Patient temperature 37.0    Collection site RIGHT RADIAL    Drawn by [phone]    Sample type  ARTERIAL DRAW    Allens test (pass/fail) PASS PASS    Comment: Performed at [facility], [address] [place], [place] [address]  Draw ABG 1 hour after initiation of ventilator     Status: Abnormal   Collection Time: 05/17/18  9:44 AM  Result Value Ref Range   FIO2 100.00    Delivery systems VENTILATOR    Mode PRESSURE REGULATED VOLUME CONTROL    VT 360 mL   LHR 18 resp/min   Peep/cpap 8.0 cm H20   pH, Arterial 7.358 7.350 - 7.450   pCO2 arterial 42.2 32.0 - 48.0 mmHg   pO2, Arterial 69.7 (L) 83.0 - 108.0 mmHg   Bicarbonate 22.9 20.0 - 28.0 mmol/L   Acid-base deficit 1.5 0.0 - 2.0 mmol/L   O2 Saturation 91.1 %   Patient temperature 37.0    Collection site RIGHT RADIAL    Drawn by [phone]    Sample type ARTERIAL DRAW    Allens test (pass/fail) PASS PASS    Comment: Performed at [facility], [address] [place], [place] [address]  Procalcitonin - Baseline     Status: None   Collection Time: 05/17/18 10:26 AM  Result Value Ref Range   Procalcitonin 0.15 ng/mL    Comment:        Interpretation: PCT (Procalcitonin) <= 0.5 ng/mL: Systemic infection (sepsis) is not likely. Local bacterial infection is possible. (NOTE)       Sepsis PCT Algorithm           Lower Respiratory Tract                                      Infection PCT Algorithm    ----------------------------     ----------------------------         PCT < 0.25 ng/mL                PCT < 0.10 ng/mL         Strongly encourage             Strongly discourage   discontinuation of antibiotics    initiation of antibiotics    ----------------------------     -----------------------------       PCT 0.25 - 0.50 ng/mL  PCT 0.10 - 0.25 ng/mL               OR       >80% decrease in PCT            Discourage initiation of                                            antibiotics      Encourage discontinuation           of antibiotics    ----------------------------     -----------------------------         PCT >=  0.50 ng/mL              PCT 0.26 - 0.50 ng/mL               AND        <80% decrease in PCT             Encourage initiation of                                             antibiotics       Encourage continuation           of antibiotics    ----------------------------     -----------------------------        PCT >= 0.50 ng/mL                  PCT > 0.50 ng/mL               AND         increase in PCT                  Strongly encourage                                      initiation of antibiotics    Strongly encourage escalation           of antibiotics                                     -----------------------------                                           PCT <= 0.25 ng/mL                                                 OR                                        > 80% decrease in PCT  Discontinue / Do not initiate                                             antibiotics Performed at [facility], [address] [place], [place] [address]   Magnesium     Status: None   Collection Time: 05/17/18 10:26 AM  Result Value Ref Range   Magnesium 2.1 1.7 - 2.4 mg/dL    Comment: Performed at [facility], [address], [place], [place] [address]  Phosphorus     Status: None   Collection Time: 05/17/18 10:26 AM  Result Value Ref Range   Phosphorus 4.0 2.5 - 4.6 mg/dL    Comment: Performed at [facility], [address] [place], [place] [address]  Brain natriuretic peptide     Status: Abnormal   Collection Time: 05/17/18 10:26 AM  Result Value Ref Range   B Natriuretic Peptide 231.0 (H) 0.0 - 100.0 pg/mL    Comment: Performed at [facility], [address] [place], [place] [address]  Glucose, capillary     Status: Abnormal   Collection Time: 05/17/18  4:40 PM  Result Value Ref Range   Glucose-Capillary 142 (H) 70 - 99 mg/dL  Glucose, capillary     Status: Abnormal   Collection Time: 05/17/18  7:40 PM  Result Value Ref Range   Glucose-Capillary  155 (H) 70 - 99 mg/dL  Glucose, capillary     Status: Abnormal   Collection Time: 05/18/18 12:10 AM  Result Value Ref Range   Glucose-Capillary 145 (H) 70 - 99 mg/dL  Comprehensive metabolic panel     Status: Abnormal   Collection Time: 05/18/18  3:56 AM  Result Value Ref Range   Sodium 141 135 - 145 mmol/L   Potassium 4.3 3.5 - 5.1 mmol/L   Chloride 111 98 - 111 mmol/L   CO2 23 22 - 32 mmol/L   Glucose, Bld 195 (H) 70 - 99 mg/dL   BUN 32 (H) 8 - 23 mg/dL   Creatinine, Ser 1.21 (H) 0.44 - 1.00 mg/dL   Calcium 8.6 (L) 8.9 - 10.3 mg/dL   Total Protein 5.9 (L) 6.5 - 8.1 g/dL   Albumin 1.9 (L) 3.5 - 5.0 g/dL   AST 21 15 - 41 U/L   ALT 22 0 - 44 U/L   Alkaline Phosphatase 87 38 - 126 U/L   Total Bilirubin 0.5 0.3 - 1.2 mg/dL   GFR calc non Af Amer 45 (L) >60 mL/min   GFR calc Af Amer 52 (L) >60 mL/min    Comment: (NOTE) The eGFR has been calculated using the CKD EPI equation. This calculation has not been validated in all clinical situations. eGFR's persistently <60 mL/min signify possible Chronic Kidney Disease.    Anion gap 7 5 - 15    Comment: Performed at [facility], [address] [place], [place] [address]  CBC     Status: Abnormal   Collection Time: 05/18/18  3:56 AM  Result Value Ref Range   WBC 20.5 (H) 4.0 - 10.5 K/uL   RBC 3.16 (L) 3.87 - 5.11 MIL/uL   Hemoglobin 8.8 (L) 12.0 - 15.0 g/dL   HCT 28.4 (L) 36.0 - 46.0 %   MCV 89.9 80.0 - 100.0 fL   MCH 27.8 26.0 - 34.0 pg   MCHC 31.0 30.0 - 36.0 g/dL   RDW 14.3 11.5 -  15.5 %   Platelets 332 150 - 400 K/uL   nRBC 0.1 0.0 - 0.2 %    Comment: Performed at [facility], [address], [place], [place] [address]  Phosphorus     Status: None   Collection Time: 05/18/18  3:56 AM  Result Value Ref Range   Phosphorus 3.7 2.5 - 4.6 mg/dL    Comment: Performed at [facility], [address] [place], [place] [address]  Glucose, capillary     Status: Abnormal   Collection Time: 05/18/18  4:17 AM  Result Value Ref Range    Glucose-Capillary 168 (H) 70 - 99 mg/dL  Blood gas, arterial     Status: Abnormal   Collection Time: 05/18/18  5:33 AM  Result Value Ref Range   FIO2 100.00    Delivery systems VENTILATOR    Mode PRESSURE REGULATED VOLUME CONTROL    VT 360 mL   LHR 18 resp/min   Peep/cpap 8.0 cm H20   pH, Arterial 7.342 (L) 7.350 - 7.450   pCO2 arterial 44.1 32.0 - 48.0 mmHg   pO2, Arterial 126 (H) 83.0 - 108.0 mmHg   Bicarbonate 22.8 20.0 - 28.0 mmol/L   Acid-base deficit 1.6 0.0 - 2.0 mmol/L   O2 Saturation 98.2 %   Patient temperature 37.0    Collection site LEFT RADIAL    Drawn by [phone]    Sample type ARTERIAL DRAW    Allens test (pass/fail) PASS PASS    Comment: Performed at [facility], [address], [place], [place] [address]  Glucose, capillary     Status: Abnormal   Collection Time: 05/18/18  8:02 AM  Result Value Ref Range   Glucose-Capillary 160 (H) 70 - 99 mg/dL    ABGS Recent Labs    05/18/18 0533  PHART 7.342*  PO2ART 126*  HCO3 22.8   CULTURES Recent Results (from the past 240 hour(s))  Urine culture     Status: None   Collection Time: 05/07/2018 11:52 AM  Result Value Ref Range Status   Specimen Description   Final    URINE, RANDOM Performed at [facility], [address], [place], [place] [address]    Special Requests   Final    NONE Performed at [facility], [address], [place], [place] [address]    Culture   Final    NO GROWTH Performed at [facility], [place] [address], [place], [place] [address]    Report Status 05/12/2018 FINAL  Final  Culture, blood (routine x 2)     Status: None   Collection Time: 04/29/2018 12:38 PM  Result Value Ref Range Status   Specimen Description BLOOD RIGHT ARM  Final   Special Requests   Final    BOTTLES DRAWN AEROBIC AND ANAEROBIC Blood Culture adequate volume   Culture   Final    NO GROWTH 5 DAYS Performed at [facility], [address], [place], [place] [address]    Report Status 05/16/2018 FINAL  Final   Culture, blood (routine x 2)     Status: None   Collection Time: 05/18/2018 12:38 PM  Result Value Ref Range Status   Specimen Description BLOOD RIGHT HAND  Final   Special Requests   Final    BOTTLES DRAWN AEROBIC AND ANAEROBIC Blood Culture adequate volume   Culture   Final    NO GROWTH 5 DAYS Performed at [facility], [address], [place], [place] [address]    Report Status 05/16/2018 FINAL  Final  MRSA PCR Screening     Status: None   Collection Time: 05/15/18  7:17 AM  Result Value Ref Range Status   MRSA by PCR NEGATIVE NEGATIVE Final    Comment:        The GeneXpert MRSA Assay (FDA approved for NASAL specimens only), is one component of a comprehensive MRSA colonization surveillance program. It is not intended to diagnose MRSA infection nor to guide or monitor treatment for MRSA infections. Performed at [facility], [address], [place], [place] [address]    Studies/Results: Dg Chest Port 1 View  Result Date: 05/18/2018 CLINICAL DATA:  Recent pneumonia EXAM: PORTABLE CHEST 1 VIEW COMPARISON:  Yesterday FINDINGS: Endotracheal tube tip at the clavicular heads. An orogastric tube reaches the stomach at least. Right upper extremity PICC in the interim with tip at the SVC. Extensive interstitial and airspace opacity with a patchy appearance. No visible effusion or pneumothorax. Normal heart size. Artifact from EKG leads. IMPRESSION: 1. Unremarkable hardware positioning. 2. Multifocal pneumonia with possible edema. Electronically Signed   By: Monte Fantasia M.D.   On: 05/18/2018 07:18   Portable Chest X-ray  Result Date: 05/17/2018 CLINICAL DATA:  Evaluate endotracheal tube placement EXAM: PORTABLE CHEST 1 VIEW COMPARISON:  Portable chest x-ray of 05/17/2017 FINDINGS: Endotracheal tube has now been placed with the tip approximately 2.7 cm above the carina. There is little change in patchy lung opacities greatest in the right mid and upper lung which appears most typical  of pneumonia. However as noted on the prior dictation, superimposed edema cannot be excluded. Mild cardiomegaly is stable. IMPRESSION: 1. Tip of endotracheal tube 2.7 cm above the carina. 2. Little change in lung opacities as noted previously suspicious for pneumonia and possibly superimposed edema. Electronically Signed   By: Ivar Drape M.D.   On: 05/17/2018 10:12   Dg Chest Port 1 View  Result Date: 05/17/2018 CLINICAL DATA:  Respiratory failure EXAM: PORTABLE CHEST 1 VIEW COMPARISON:  05/16/2018 FINDINGS: Right upper lobe and left lower lobe opacities, suspicious for pneumonia. Superimposed interstitial opacities, suspicious for interstitial edema. No definite pleural effusions. The heart is normal in size. No pneumothorax. IMPRESSION: Multifocal opacities, right upper lobe predominant, suspicious for pneumonia. Superimposed interstitial edema is also suspected. Overall appearance is similar to the prior. Electronically Signed   By: Julian Hy M.D.   On: 05/17/2018 09:12   Dg Chest Port 1 View  Result Date: 05/16/2018 CLINICAL DATA:  Respiratory failure EXAM: PORTABLE CHEST 1 VIEW COMPARISON:  CTA chest dated 05/14/2018 FINDINGS: Multifocal patchy opacities, right upper lobe predominant, suspicious for pneumonia. Superimposed mild interstitial edema is possible. No definite pleural effusion. No pneumothorax. The heart is normal in size. IMPRESSION: Multifocal patchy opacities, right upper lobe predominant, suspicious for pneumonia. Superimposed mild interstitial edema is possible. These findings are better evaluated on recent CT. Electronically Signed   By: Julian Hy M.D.   On: 05/16/2018 09:32   [place] Ekg Site Rite  Result Date: 05/17/2018 If Site Rite image not attached, placement could not be confirmed due to current cardiac rhythm.   Medications:  Prior to Admission:  Medications Prior to Admission  Medication Sig Dispense Refill Last Dose  . ALPRAZolam (XANAX) 0.5 MG tablet  Take 0.5 mg by mouth daily as needed.    Past Month at Unknown time  . aspirin 81 MG tablet Take 81 mg by mouth every other day.    Past Week at Unknown time  . cetirizine (ZYRTEC ALLERGY) 10  MG tablet Take 10 mg by mouth daily.   Past Week at Unknown time  . doxycycline (VIBRA-TABS) 100 MG tablet Take 100 mg by mouth 2 (two) times daily.   05/10/2018 at 1800  . levothyroxine (SYNTHROID, LEVOTHROID) 50 MCG tablet Take 50 mcg by mouth as directed. Patient takes 22mg on Saturday and Sunday; all other days she takes 762m   05/07/2018 at 1000  . levothyroxine (SYNTHROID, LEVOTHROID) 75 MCG tablet Take 75 mcg by mouth as directed. Patient takes 7545mevery day except Saturday and Sunday; on Saturday and Sunday, she takes 34m12m 05/22/2018 at 1000  . lisinopril (PRINIVIL,ZESTRIL) 20 MG tablet TAKE 1/2 TABLET BY MOUTH ONCE DAILY. 45 tablet 3 05/10/2018 at 1800  . pravastatin (PRAVACHOL) 40 MG tablet TAKE ONE TABLET BY MOUTH IN THE EVENING. 90 tablet 2 05/10/2018 at 1800  . Pseudoephedrine-Guaifenesin (MUCINEX D MAX STRENGTH) [phone] MG TB12 Take 1 tablet by mouth daily.   05/10/2018 at Unknown time  . verapamil (VERELAN PM) 240 MG 24 hr capsule TAKE (1) CAPSULE BY MOUTH AT BEDTIME. 30 capsule 2 05/10/2018 at 1800  . zolpidem (AMBIEN) 10 MG tablet Take 10 mg by mouth at bedtime.    05/10/2018 at Unknown time   Scheduled: . acetylcysteine  3 mL Nebulization Q6H  . budesonide (PULMICORT) nebulizer solution  0.5 mg Nebulization BID  . chlorhexidine gluconate (MEDLINE KIT)  15 mL Mouth Rinse BID  . Chlorhexidine Gluconate Cloth  6 each Topical Daily  . enoxaparin (LOVENOX) injection  40 mg Subcutaneous Q24H  . feeding supplement (PRO-STAT SUGAR FREE 64)  30 mL Per Tube Daily  . feeding supplement (VITAL AF 1.2 CAL)  1,000 mL Per Tube Q24H  . fentaNYL (SUBLIMAZE) injection  50 mcg Intravenous Once  . ipratropium-albuterol  3 mL Nebulization Q6H WA  . levothyroxine  37.5 mcg Intravenous Daily  . mouth  rinse  15 mL Mouth Rinse 10 times per day  . methylPREDNISolone (SOLU-MEDROL) injection  60 mg Intravenous Q6H  . sodium chloride flush  10-40 mL Intracatheter Q12H  . sodium chloride flush  3 mL Intravenous Q12H   Continuous: . sodium chloride 10 mL/hr at 05/18/18 0800  . ceFEPime (MAXIPIME) IV Stopped (05/17/18 2135)  . fentaNYL infusion INTRAVENOUS 75 mcg/hr (05/18/18 0800)  . levofl[facility] ([facility] Stopped (05/17/18 0933)   PRN:NLG:XQJJHEoride, acetaminophen **OR** acetaminophen, albuterol, fentaNYL, fentaNYL (SUBLIMAZE) injection, midazolam, ondansetron **OR** ondansetron (ZOFRAN) IV, sodium chloride flush  Assesment: She was admitted with acute hypoxic respiratory failure and was septic initially.  She has multifocal pneumonia.  She has been increasingly hypoxic which culminated in her being intubated and placed on mechanical ventilation yesterday.  Chest x-ray does not show much change.  She has some element of COPD at baseline and that is being treated  She has elevated BNP and received Lasix.  Chest x-ray is at least suggestive that she may have some volume overload.  Albumin is 1.9 and that may contribute to third spacing of fluids.  She is on tube feedings. 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  Can try to reduce her FiO2.  Continue other  treatments.    LOS: 6 days   Kellsie Grindle L 05/18/2018, 8:52 AM

## 2018-05-18 NOTE — Progress Notes (Signed)
Pharmacy Antibiotic Note  Jasmine Black is a 67 y.o. female admitted on 04/29/2018 with pneumonia.  Pharmacy has been consulted for cefepime dosing. Intubated 11/20, chest x-ray with mulitfocal PNA.  PCT reassuring at 0.19 > 0.16 > 0.13 and undetectable on 11/18. Intersitial edema.  Black#5 of cefepime and Black#4 of levaquin.  SCr bumped a little  Plan: Continue Cefepime 2gm IV q24h Also, on Levaquin 750mg  IV q48 FU cxs and clinical progress Monitor V/S, labs   Height: 5' (152.4 cm) Weight: 165 lb 9.1 oz (75.1 kg) IBW/kg (Calculated) : 45.5  Temp (24hrs), Avg:98.4 F (36.9 C), Min:97.4 F (36.3 C), Max:98.8 F (37.1 C)  Recent Labs  Lab 05/08/2018 1238 05/03/2018 1400  05/13/18 0633 05/15/18 0801 05/16/18 0607 05/17/18 0413 05/18/18 0356  WBC  --   --    < > 11.8* 26.4* 20.1* 19.3* 20.5*  CREATININE  --   --    < > 0.86 0.94 0.97 1.02* 1.21*  LATICACIDVEN 2.9* 1.6  --   --   --   --   --   --    < > = values in this interval not displayed.    Estimated Creatinine Clearance: 39.7 mL/min (A) (by C-G formula based on SCr of 1.21 mg/dL (H)).    Allergies  Allergen Reactions  . Neosporin [Neomycin-Bacitracin Zn-Polymyx] Rash    Antimicrobials this admission: 11/17 cefepime >>  11/17 vancomycin >> 11/19 11/18 Levaquin >>  Microbiology results: 11/1 BCx: nx5 day (final) 11/17 UCx: no growth (final) 11/17 MRSA PCR: negative 11/20 Resp: pending  Thank you for allowing pharmacy to be a part of this patient's care.  Jasmine Black, BS Jasmine Black, [place] Clinical Pharmacist Pager [phone] 05/18/2018 11:27 AM

## 2018-05-18 NOTE — Progress Notes (Signed)
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 She had gone to her PCP a few days prior for a week of fevers, chills, increasing cough, and diagnosed with pneumonia, started on doxycycline and nebulized breathing treatments at home but has had no improvement. Over the past 24 hours she's grown increasingly weak, getting up out of bed rarely, much more short of breath when she attempts this and constantly feels like she's about to pass out. On initial evaluation she was afebrile, but was 100.7Fat home prior to taking tylenol. Tachycardic with soft BP, hypoxic to 88% on room air, improved with 2L O2, tachypneic. WBC 11.3k, flu negative, lactic acid elevated. Levaquin and IV fluids given after blood cultures drawn.She continued to be hypoxic and developed wheezing for which steroids and breathing treatments were provided. Respiratory effort increased and hypoxia worsened prompting pulmonology consultation and transfer to SDU/ICU on 11/18.BiPAP started with improvement in oxygenation. The patient did not show any improvement on BiPAP.  She was intubated on 05/17/18.  Assessment/Plan: Acute hypoxic respiratory failure:  -Due to multifocal pneumonia, bronchospasm and an element of interstitial edema. - Continue broad antimicrobial coverage,   -Stopped vancomycin as she failed an adequate trial of doxycycline, MRSA negative. - increase solumedrol to 60 IV q 6 - Intubated 05/17/18 - received lasix 11/18 and 11/19 -set up CVP  Sepsis due to multilobar pneumonia:  -Flu negative. - Continuebroad antibiotics.PCT reassuring at 0.19 > 0.16 > 0.13 and undetectable on 11/18. -Blood  cultures negative. - tracheal aspirate for culture -repeat PCT -viral respiratory panel neg  COPD Exacerbation -pt has at least 20 pack year history of tobacco -increased solumedrol -conitinue pulmicort -continue bronchodilators  FEN -placed OG -continue enteral feeding -check mag  -check phos  HTN:  -Currently soft blood pressures.  - Continue to hold lisinopril  Tachycardia -question atrial tachycardia/SVT -Troponin negative - EKG -change albuterol to xopenex  Hypothyroidism:  - Continue home variable synthroid dosing. -change synthroid to IV  Thrombocytosis:  -Reactive, monitor. -trending down  Leukocytosis:  -Difficult to distinguish between infection vs. steroid-induced.  - Continue monitoring. -pt remains afebrile and hemodynamically stable -repeat procalcitonin--0.15  Stage III CKD:  -Creatinine at baseline on admission.Had lasix, vancomycin, and contrast but seems to be stable. -Continues to be stable, will monitor with redosing lasix. -baseline creatinine 0.8-1.1  Carotid stenosis:  -Continue QOD ASA.  Hyperlipidemia:  -Continue statin    Disposition Plan:   remain in ICU Family Communication:   Family at bedside updated 11/21  Consultants:  pulmonary  Code Status:  FULL   DVT Prophylaxis: E[place]ice Lovenox   Procedures: As Listed in Progress Note Above  Antibiotics: vanco 11/18 levoflox 11/14>>> Cefepime 11/18>>>   The patient is critically ill with multiple organ systems failure and requires high complexity decision making for assessment and support, frequent evaluation and titration of therapies, application of advanced monitoring technologies and extensive interpretation of multiple databases.  Critical care time - 35 mins.         Subjective: Pt is awake on ventilator.  Denies cp, sob, n/v/d, abd pain.  Tolerating enteral feeding  Objective: Vitals:   05/18/18 1000 05/18/18 1030 05/18/18 1100  05/18/18 1126  BP: 129/68 124/60 133/62   Pulse: (!) 131 (!) 124 (!) 129   Resp: '20 19 20   '$ Temp:      TempSrc:    Axillary  SpO2: 95% 95% 96%   Weight:      Height:        Intake/Output Summary (Last 24 hours) at 05/18/2018 1137 Last data filed at 05/18/2018 1100 Gross per 24 hour  Intake 1575.06 ml  Output 1000 ml  Net 575.06 ml   Weight change: 1.1 kg Exam:   General:  Pt is alert, follows commands appropriately, not in acute distress  HEENT: No icterus, No thrush, No neck mass, [place]/AT  Cardiovascular: RRR, S1/S2, no rubs, no gallops  Respiratory: scattered bilateral rales  Abdomen: Soft/+BS, non tender, non distended, no guarding  Extremities: trace LE edema, No lymphangitis, No petechiae, No rashes, no synovitis   Data Reviewed: I have personally reviewed following labs and imaging studies Basic Metabolic Panel: Recent Labs  Lab 05/13/18 0633 05/15/18 0801 05/16/18 0607 05/17/18 0413 05/17/18 1026 05/18/18 0356  NA 132* 135 137 140  --  141  K 3.9 4.1 4.1 3.6  --  4.3  CL 106 107 108 109  --  111  CO2 20* 19* 21* 22  --  23  GLUCOSE 96 124* 141* 142*  --  195*  BUN 7* '11 14 21  '$ --  32*  CREATININE 0.86 0.94 0.97 1.02*  --  1.21*  CALCIUM 7.4* 8.2* 8.2* 8.3*  --  8.6*  MG  --   --   --   --  2.1  --   PHOS  --   --   --   --  4.0 3.7   Liver Function Tests: Recent Labs  Lab 05/15/18 0801 05/18/18 0356  AST 30 21  ALT 16 22  ALKPHOS 73 87  BILITOT 0.4 0.5  PROT 6.2* 5.9*  ALBUMIN 2.0* 1.9*   No results for input(s): LIPASE, AMYLASE in the last 168 hours. No results for input(s): AMMONIA in the last 168 hours. Coagulation Profile: No results for input(s): INR, PROTIME in the last 168 hours. CBC: Recent Labs  Lab 05/21/2018 1152  05/13/18 0633 05/15/18 0801 05/16/18 0607 05/17/18 0413 05/18/18 0356  WBC 11.3*   < > 11.8* 26.4* 20.1* 19.3* 20.5*  NEUTROABS 8.4*  --   --   --   --   --   --   HGB 11.3*   < > 9.6* 9.6* 9.2* 8.8* 8.8*    HCT 34.8*   < > 29.9* 30.4* 28.8* 27.7* 28.4*  MCV 89.0   < > 91.4 92.7 89.4 87.7 89.9  PLT 525*   < > 432* 576* 501* 427* 332   < > = values in this interval not displayed.   Cardiac Enzymes: Recent Labs  Lab 05/06/2018 1152  TROPONINI <0.03   BNP: Invalid input(s): POCBNP CBG: Recent Labs  Lab 05/17/18 1940 05/18/18 0010 05/18/18 0417 05/18/18 0802 05/18/18 1131  GLUCAP 155* 145* 168* 160* 172*   HbA1C: No results for input(s): HGBA1C in the last 72 hours. Urine analysis:    Component Value Date/Time   COLORURINE YELLOW 05/13/2018 [place] 05/02/2018 1152   LABSPEC 1.010 05/22/2018 1152   PHURINE 6.0 05/20/2018 1152   GLUCOSEU NEGATIVE 04/30/2018 1152   HGBUR NEGATIVE 05/17/2018 1152   BILIRUBINUR NEGATIVE 05/18/2018 1152   KETONESUR NEGATIVE 05/08/2018 1152   PROTEINUR NEGATIVE 05/07/2018 1152   UROBILINOGEN 0.2  01/07/2010 1316   NITRITE NEGATIVE 04/29/2018 1152   LEUKOCYTESUR TRACE (A) 05/24/2018 1152   Sepsis Labs: '@LABRCNTIP'$ (procalcitonin:4,lacticidven:4) ) Recent Results (from the past 240 hour(s))  Urine culture     Status: None   Collection Time: 05/10/2018 11:52 AM  Result Value Ref Range Status   Specimen Description   Final    URINE, RANDOM Performed at [facility], [address], [place], [place] [address]    Special Requests   Final    NONE Performed at [facility], [address] [place], [place] [address]    Culture   Final    NO GROWTH Performed at [facility], [place] [address] [place], [place] [address]    Report Status 05/12/2018 FINAL  Final  Culture, blood (routine x 2)     Status: None   Collection Time: 05/10/2018 12:38 PM  Result Value Ref Range Status   Specimen Description BLOOD RIGHT ARM  Final   Special Requests   Final    BOTTLES DRAWN AEROBIC AND ANAEROBIC Blood Culture adequate volume   Culture   Final    NO GROWTH 5 DAYS Performed at [facility], [address], [place], [place] [address]     Report Status 05/16/2018 FINAL  Final  Culture, blood (routine x 2)     Status: None   Collection Time: 04/29/2018 12:38 PM  Result Value Ref Range Status   Specimen Description BLOOD RIGHT HAND  Final   Special Requests   Final    BOTTLES DRAWN AEROBIC AND ANAEROBIC Blood Culture adequate volume   Culture   Final    NO GROWTH 5 DAYS Performed at [facility], [address] [place], [place] [address]    Report Status 05/16/2018 FINAL  Final  MRSA PCR Screening     Status: None   Collection Time: 05/15/18  7:17 AM  Result Value Ref Range Status   MRSA by PCR NEGATIVE NEGATIVE Final    Comment:        The GeneXpert MRSA Assay (FDA approved for NASAL specimens only), is one component of a comprehensive MRSA colonization surveillance program. It is not intended to diagnose MRSA infection nor to guide or monitor treatment for MRSA infections. Performed at [facility], [address], [place],  [address]      Scheduled Meds: . acetylcysteine  3 mL Nebulization Q6H  . budesonide (PULMICORT) nebulizer solution  0.5 mg Nebulization BID  . chlorhexidine gluconate (MEDLINE KIT)  15 mL Mouth Rinse BID  . Chlorhexidine Gluconate Cloth  6 each Topical Daily  . enoxaparin (LOVENOX) injection  40 mg Subcutaneous Q24H  . feeding supplement (PRO-STAT SUGAR FREE 64)  30 mL Per Tube Daily  . feeding supplement (VITAL AF 1.2 CAL)  1,000 mL Per Tube Q24H  . fentaNYL (SUBLIMAZE) injection  50 mcg Intravenous Once  . ipratropium  0.5 mg Nebulization Q6H  . levalbuterol  1.25 mg Nebulization Q6H  . levothyroxine  37.5 mcg Intravenous Daily  . mouth rinse  15 mL Mouth Rinse 10 times per day  . methylPREDNISolone (SOLU-MEDROL) injection  60 mg Intravenous Q6H  . sodium chloride flush  10-40 mL Intracatheter Q12H  . sodium chloride flush  3 mL Intravenous Q12H   Continuous Infusions: . sodium chloride 10 mL/hr at 05/18/18 0800  . ceFEPime (MAXIPIME) IV 1 g (05/18/18 1058)  . fentaNYL  infusion INTRAVENOUS 75 mcg/hr (05/18/18 0800)  . levofloxacin (LEVAQUIN) IV Stopped (05/17/18 6073)    Procedures/Studies: Dg  Chest 2 View  Result Date: 05/03/2018 CLINICAL DATA:  Recent pneumonia with persistent cough, fever, and shortness of breath. EXAM: CHEST - 2 VIEW COMPARISON:  May 08, 2018 FINDINGS: Extensive airspace disease is noted throughout much of the right upper and lower lobe regions. There is also patchy infiltrate in the left lower lobe. The degree of consolidation on the right is stable. There is slight the increased consolidation in the left base. Heart size and pulmonary vascularity are normal. No adenopathy. No bone lesions. IMPRESSION: Multifocal pneumonia. Areas of airspace opacity in the right upper and lower lobes appears essentially stable compared to 3 days prior. Increase in infiltrate left base. Stable cardiac silhouette. No adenopathy evident. Electronically Signed   By: Lowella Grip III M.D.   On: 04/28/2018 12:57   Dg Chest 2 View  Result Date: 05/08/2018 CLINICAL DATA:  Productive cough and fever for a couple weeks, former smoker, history hypertension, cardiomyopathy EXAM: CHEST - 2 VIEW COMPARISON:  None FINDINGS: Normal heart size, mediastinal contours, and pulmonary vascularity. Scattered opacities throughout the RIGHT lung likely representing pneumonia. However these are somewhat patchy/nodular in appearance and require follow-up until resolution to exclude underlying abnormalities including tumor. Minimal atelectasis versus consolidation in LEFT lower lobe. Remaining LEFT lung clear. Elevation of RIGHT diaphragm. No pleural effusion or pneumothorax. IMPRESSION: Predominately RIGHT-sided pulmonary infiltrates consistent with pneumonia. Followup PA and lateral chest X-ray is recommended in 3-4 weeks following trial of antibiotic therapy to ensure resolution and exclude underlying malignancy. These results will be called to the ordering clinician or  representative by the Radiologist Assistant, and communication documented in the PACS or zVision Dashboard. Electronically Signed   By: Lavonia Dana M.D.   On: 05/08/2018 11:45   Ct Angio Chest Pe W Or Wo Contrast  Result Date: 05/14/2018 CLINICAL DATA:  Shortness of breath worsening today. Clinical concern for pulmonary embolus. Positive D-dimer. EXAM: CT ANGIOGRAPHY CHEST WITH CONTRAST TECHNIQUE: Multidetector CT imaging of the chest was performed using the standard protocol during bolus administration of intravenous contrast. Multiplanar CT image reconstructions and MIPs were obtained to evaluate the vascular anatomy. CONTRAST:  77m ISOVUE-370 IOPAMIDOL (ISOVUE-370) INJECTION 76% COMPARISON:  None FINDINGS: Cardiovascular: The heart size is normal. No substantial pericardial effusion. No thoracic aortic aneurysm. No filling defect in the opacified pulmonary arteries to suggest the presence of an acute pulmonary embolus. Mediastinum/Nodes: 14 mm short axis subcarinal lymph node is associated with mild bilateral hilar lymphadenopathy. The esophagus has normal imaging features. There is no axillary lymphadenopathy. Lungs/Pleura: The central tracheobronchial airways are patent. Asymmetric patchy airspace disease, right greater than left is associated with interlobular septal thickening. Confluent airspace disease noted in the right lower lobe posterior left lower lobe. Small to moderate right pleural effusion evident with small left pleural effusion. Upper Abdomen: The liver shows diffusely decreased attenuation suggesting steatosis. Musculoskeletal: No worrisome lytic or sclerotic osseous abnormality. Review of the MIP images confirms the above findings. IMPRESSION: 1. No CT evidence for acute pulmonary embolus. 2. Bilateral patchy, right greater than left airspace disease compatible with multifocal pneumonia. 3. Mild mediastinal and bilateral hilar lymphadenopathy. 4. Small to moderate bilateral pleural  effusions.  The Electronically Signed   By: EMisty StanleyM.D.   On: 05/14/2018 17:35   Dg Chest Port 1 View  Result Date: 05/18/2018 CLINICAL DATA:  Recent pneumonia EXAM: PORTABLE CHEST 1 VIEW COMPARISON:  Yesterday FINDINGS: Endotracheal tube tip at the clavicular heads. An orogastric tube reaches the stomach at least. Right upper  extremity PICC in the interim with tip at the SVC. Extensive interstitial and airspace opacity with a patchy appearance. No visible effusion or pneumothorax. Normal heart size. Artifact from EKG leads. IMPRESSION: 1. Unremarkable hardware positioning. 2. Multifocal pneumonia with possible edema. Electronically Signed   By: Monte Fantasia M.D.   On: 05/18/2018 07:18   Portable Chest X-ray  Result Date: 05/17/2018 CLINICAL DATA:  Evaluate endotracheal tube placement EXAM: PORTABLE CHEST 1 VIEW COMPARISON:  Portable chest x-ray of 05/17/2017 FINDINGS: Endotracheal tube has now been placed with the tip approximately 2.7 cm above the carina. There is little change in patchy lung opacities greatest in the right mid and upper lung which appears most typical of pneumonia. However as noted on the prior dictation, superimposed edema cannot be excluded. Mild cardiomegaly is stable. IMPRESSION: 1. Tip of endotracheal tube 2.7 cm above the carina. 2. Little change in lung opacities as noted previously suspicious for pneumonia and possibly superimposed edema. Electronically Signed   By: Ivar Drape M.D.   On: 05/17/2018 10:12   Dg Chest Port 1 View  Result Date: 05/17/2018 CLINICAL DATA:  Respiratory failure EXAM: PORTABLE CHEST 1 VIEW COMPARISON:  05/16/2018 FINDINGS: Right upper lobe and left lower lobe opacities, suspicious for pneumonia. Superimposed interstitial opacities, suspicious for interstitial edema. No definite pleural effusions. The heart is normal in size. No pneumothorax. IMPRESSION: Multifocal opacities, right upper lobe predominant, suspicious for pneumonia.  Superimposed interstitial edema is also suspected. Overall appearance is similar to the prior. Electronically Signed   By: Julian Hy M.D.   On: 05/17/2018 09:12   Dg Chest Port 1 View  Result Date: 05/16/2018 CLINICAL DATA:  Respiratory failure EXAM: PORTABLE CHEST 1 VIEW COMPARISON:  CTA chest dated 05/14/2018 FINDINGS: Multifocal patchy opacities, right upper lobe predominant, suspicious for pneumonia. Superimposed mild interstitial edema is possible. No definite pleural effusion. No pneumothorax. The heart is normal in size. IMPRESSION: Multifocal patchy opacities, right upper lobe predominant, suspicious for pneumonia. Superimposed mild interstitial edema is possible. These findings are better evaluated on recent CT. Electronically Signed   By: Julian Hy M.D.   On: 05/16/2018 09:32   [place] Ekg Site Rite  Result Date: 05/17/2018 If Site Rite image not attached, placement could not be confirmed due to current cardiac rhythm.   Orson Eva, DO  [facility] Pager [phone]  If 7PM-7AM, please contact night-coverage [email or web address] Password [facility] 05/18/2018, 11:37 AM   LOS: 6 days

## 2018-05-19 ENCOUNTER — Inpatient Hospital Stay (HOSPITAL_COMMUNITY): Payer: PPO

## 2018-05-19 LAB — CBC WITH DIFFERENTIAL/PLATELET
Abs Immature Granulocytes: 0.4 10*3/uL — ABNORMAL HIGH (ref 0.00–0.07)
BASOS ABS: 0 10*3/uL (ref 0.0–0.1)
Basophils Relative: 0 %
EOS ABS: 0.2 10*3/uL (ref 0.0–0.5)
Eosinophils Relative: 1 %
HEMATOCRIT: 31 % — AB (ref 36.0–46.0)
HEMOGLOBIN: 9.4 g/dL — AB (ref 12.0–15.0)
Immature Granulocytes: 2 %
LYMPHS PCT: 4 %
Lymphs Abs: 1 10*3/uL (ref 0.7–4.0)
MCH: 28.5 pg (ref 26.0–34.0)
MCHC: 30.3 g/dL (ref 30.0–36.0)
MCV: 93.9 fL (ref 80.0–100.0)
MONO ABS: 1 10*3/uL (ref 0.1–1.0)
Monocytes Relative: 4 %
NRBC: 0.2 % (ref 0.0–0.2)
Neutro Abs: 21.6 10*3/uL — ABNORMAL HIGH (ref 1.7–7.7)
Neutrophils Relative %: 89 %
Platelets: 235 10*3/uL (ref 150–400)
RBC: 3.3 MIL/uL — AB (ref 3.87–5.11)
RDW: 14.8 % (ref 11.5–15.5)
WBC: 24.2 10*3/uL — AB (ref 4.0–10.5)

## 2018-05-19 LAB — GLUCOSE, CAPILLARY
GLUCOSE-CAPILLARY: 143 mg/dL — AB (ref 70–99)
GLUCOSE-CAPILLARY: 160 mg/dL — AB (ref 70–99)
Glucose-Capillary: 158 mg/dL — ABNORMAL HIGH (ref 70–99)
Glucose-Capillary: 160 mg/dL — ABNORMAL HIGH (ref 70–99)
Glucose-Capillary: 174 mg/dL — ABNORMAL HIGH (ref 70–99)
Glucose-Capillary: 185 mg/dL — ABNORMAL HIGH (ref 70–99)
Glucose-Capillary: 188 mg/dL — ABNORMAL HIGH (ref 70–99)

## 2018-05-19 LAB — PHOSPHORUS: Phosphorus: 4.8 mg/dL — ABNORMAL HIGH (ref 2.5–4.6)

## 2018-05-19 LAB — MAGNESIUM: Magnesium: 2.6 mg/dL — ABNORMAL HIGH (ref 1.7–2.4)

## 2018-05-19 LAB — URINALYSIS, COMPLETE (UACMP) WITH MICROSCOPIC
Bilirubin Urine: NEGATIVE
GLUCOSE, UA: NEGATIVE mg/dL
KETONES UR: NEGATIVE mg/dL
Leukocytes, UA: NEGATIVE
Nitrite: NEGATIVE
PH: 6 (ref 5.0–8.0)
PROTEIN: 30 mg/dL — AB
Specific Gravity, Urine: 1.02 (ref 1.005–1.030)

## 2018-05-19 LAB — BASIC METABOLIC PANEL
Anion gap: 7 (ref 5–15)
BUN: 56 mg/dL — AB (ref 8–23)
CALCIUM: 8.6 mg/dL — AB (ref 8.9–10.3)
CHLORIDE: 112 mmol/L — AB (ref 98–111)
CO2: 24 mmol/L (ref 22–32)
CREATININE: 1.43 mg/dL — AB (ref 0.44–1.00)
GFR calc non Af Amer: 36 mL/min — ABNORMAL LOW (ref >60)
GFR, EST AFRICAN AMERICAN: 42 mL/min — AB (ref >60)
Glucose, Bld: 182 mg/dL — ABNORMAL HIGH (ref 70–99)
Potassium: 4.8 mmol/L (ref 3.5–5.1)
SODIUM: 143 mmol/L (ref 135–145)

## 2018-05-19 LAB — PROCALCITONIN: Procalcitonin: 0.49 ng/mL

## 2018-05-19 MED ORDER — DILTIAZEM HCL 30 MG PO TABS
30.0000 mg | ORAL_TABLET | Freq: Four times a day (QID) | ORAL | Status: DC
  Administered 2018-05-19 – 2018-05-20 (×5): 30 mg via ORAL
  Filled 2018-05-19 (×5): qty 1

## 2018-05-19 MED ORDER — METOPROLOL TARTRATE 5 MG/5ML IV SOLN
2.5000 mg | Freq: Once | INTRAVENOUS | Status: AC
  Administered 2018-05-19: 2.5 mg via INTRAVENOUS

## 2018-05-19 MED ORDER — METOPROLOL TARTRATE 5 MG/5ML IV SOLN
INTRAVENOUS | Status: AC
  Administered 2018-05-19: 2.5 mg via INTRAVENOUS
  Filled 2018-05-19: qty 5

## 2018-05-19 MED ORDER — DILTIAZEM HCL 30 MG PO TABS: 30.0000 mg | ORAL_TABLET | Freq: Four times a day (QID) | ORAL | Status: DC

## 2018-05-19 NOTE — Progress Notes (Signed)
Subjective: She remains intubated and on ventilator support.  No new problems noted.  Objective: Vital signs in last 24 hours: Temp:  [98.9 F (37.2 C)-101.1 F (38.4 C)] 100.3 F (37.9 C) (11/22 0743) Pulse Rate:  [116-131] 124 (11/22 0400) Resp:  [12-20] 12 (11/22 0400) BP: (103-138)/(48-68) 103/53 (11/22 0400) SpO2:  [87 %-98 %] 96 % (11/22 0400) FiO2 (%):  [70 %-100 %] 100 % (11/22 0301) Weight:  [75 kg] 75 kg (11/22 0500) Weight change: -0.1 kg Last BM Date: 05/14/18  Intake/Output from previous day: 11/21 0701 - 11/22 0700 In: 1596.6 [I.V.:474.6; NG/GT:1022; IV Piggyback:100] Out: 1540 [Urine:1540]  PHYSICAL EXAM General appearance: Intubated sedated on mechanical ventilation Resp: She still has rales but her chest is clear than yesterday Cardio: regular rate and rhythm, S1, S2 normal, no murmur, click, rub or gallop GI: soft, non-tender; bowel sounds normal; no masses,  no organomegaly Extremities: extremities normal, atraumatic, no cyanosis or edema  Lab Results:  Results for orders placed or performed during the hospital encounter of 05/23/2018 (from the past 48 hour(s))  Blood gas, arterial     Status: Abnormal   Collection Time: 05/17/18  8:58 AM  Result Value Ref Range   FIO2 100.00    Delivery systems BILEVEL POSITIVE AIRWAY PRESSURE    Inspiratory PAP 10    Expiratory PAP 5    pH, Arterial 7.438 7.350 - 7.450   pCO2 arterial 35.5 32.0 - 48.0 mmHg   pO2, Arterial 61.9 (L) 83.0 - 108.0 mmHg   Bicarbonate 24.4 20.0 - 28.0 mmol/L   Acid-base deficit 0.1 0.0 - 2.0 mmol/L   O2 Saturation 90.2 %   Patient temperature 37.0    Collection site RIGHT RADIAL    Drawn by [phone]    Sample type ARTERIAL DRAW    Allens test (pass/fail) PASS PASS    Comment: Performed at [facility], [address], [place], [place] [address]  Draw ABG 1 hour after initiation of ventilator     Status: Abnormal   Collection Time: 05/17/18  9:44 AM  Result Value Ref Range   FIO2 100.00     Delivery systems VENTILATOR    Mode PRESSURE REGULATED VOLUME CONTROL    VT 360 mL   LHR 18 resp/min   Peep/cpap 8.0 cm H20   pH, Arterial 7.358 7.350 - 7.450   pCO2 arterial 42.2 32.0 - 48.0 mmHg   pO2, Arterial 69.7 (L) 83.0 - 108.0 mmHg   Bicarbonate 22.9 20.0 - 28.0 mmol/L   Acid-base deficit 1.5 0.0 - 2.0 mmol/L   O2 Saturation 91.1 %   Patient temperature 37.0    Collection site RIGHT RADIAL    Drawn by [phone]    Sample type ARTERIAL DRAW    Allens test (pass/fail) PASS PASS    Comment: Performed at [facility], [address] [place], [place] [address]  Procalcitonin - Baseline     Status: None   Collection Time: 05/17/18 10:26 AM  Result Value Ref Range   Procalcitonin 0.15 ng/mL    Comment:        Interpretation: PCT (Procalcitonin) <= 0.5 ng/mL: Systemic infection (sepsis) is not likely. Local bacterial infection is possible. (NOTE)       Sepsis PCT Algorithm           Lower Respiratory Tract  Infection PCT Algorithm    ----------------------------     ----------------------------         PCT < 0.25 ng/mL                PCT < 0.10 ng/mL         Strongly encourage             Strongly discourage   discontinuation of antibiotics    initiation of antibiotics    ----------------------------     -----------------------------       PCT 0.25 - 0.50 ng/mL            PCT 0.10 - 0.25 ng/mL               OR       >80% decrease in PCT            Discourage initiation of                                            antibiotics      Encourage discontinuation           of antibiotics    ----------------------------     -----------------------------         PCT >= 0.50 ng/mL              PCT 0.26 - 0.50 ng/mL               AND        <80% decrease in PCT             Encourage initiation of                                             antibiotics       Encourage continuation           of antibiotics    ----------------------------      -----------------------------        PCT >= 0.50 ng/mL                  PCT > 0.50 ng/mL               AND         increase in PCT                  Strongly encourage                                      initiation of antibiotics    Strongly encourage escalation           of antibiotics                                     -----------------------------                                           PCT <= 0.25 ng/mL  OR                                        > 80% decrease in PCT                                     Discontinue / Do not initiate                                             antibiotics Performed at [facility], [address] [place], [place] [address]   Magnesium     Status: None   Collection Time: 05/17/18 10:26 AM  Result Value Ref Range   Magnesium 2.1 1.7 - 2.4 mg/dL    Comment: Performed at [facility], [address], [place], [place] [address]  Phosphorus     Status: None   Collection Time: 05/17/18 10:26 AM  Result Value Ref Range   Phosphorus 4.0 2.5 - 4.6 mg/dL    Comment: Performed at [facility], [address], [place], [place] [address]  Brain natriuretic peptide     Status: Abnormal   Collection Time: 05/17/18 10:26 AM  Result Value Ref Range   B Natriuretic Peptide 231.0 (H) 0.0 - 100.0 pg/mL    Comment: Performed at [facility], [address] [place], [place] [address]  Glucose, capillary     Status: Abnormal   Collection Time: 05/17/18  4:40 PM  Result Value Ref Range   Glucose-Capillary 142 (H) 70 - 99 mg/dL  Respiratory Panel by PCR     Status: Abnormal   Collection Time: 05/17/18  5:45 PM  Result Value Ref Range   Adenovirus NOT DETECTED NOT DETECTED   Coronavirus 229E NOT DETECTED NOT DETECTED   Coronavirus HKU1 NOT DETECTED NOT DETECTED   Coronavirus NL63 NOT DETECTED NOT DETECTED   Coronavirus OC43 NOT DETECTED NOT DETECTED   Metapneumovirus NOT DETECTED NOT DETECTED   Rhinovirus /  Enterovirus DETECTED (A) NOT DETECTED   Influenza A NOT DETECTED NOT DETECTED   Influenza B NOT DETECTED NOT DETECTED   Parainfluenza Virus 1 NOT DETECTED NOT DETECTED   Parainfluenza Virus 2 NOT DETECTED NOT DETECTED   Parainfluenza Virus 3 NOT DETECTED NOT DETECTED   Parainfluenza Virus 4 NOT DETECTED NOT DETECTED   Respiratory Syncytial Virus NOT DETECTED NOT DETECTED   Bordetella pertussis NOT DETECTED NOT DETECTED   Chlamydophila pneumoniae NOT DETECTED NOT DETECTED   Mycoplasma pneumoniae NOT DETECTED NOT DETECTED    Comment: Performed at [facility] [address] [place], [place] [address]  Culture, respiratory (non-expectorated)     Status: None (Preliminary result)   Collection Time: 05/17/18  5:45 PM  Result Value Ref Range   Specimen Description      TRACHEAL ASPIRATE Performed at [facility], [address], [place], [place] [address]    Special Requests      NONE Performed at [facility], [address], [place], [place] [address]    Gram Stain      RARE WBC PRESENT, PREDOMINANTLY PMN NO ORGANISMS SEEN Performed at [facility], [place] [address] [place], [place] [address]    Culture PENDING    Report Status  PENDING   Glucose, capillary     Status: Abnormal   Collection Time: 05/17/18  7:40 PM  Result Value Ref Range   Glucose-Capillary 155 (H) 70 - 99 mg/dL  Glucose, capillary     Status: Abnormal   Collection Time: 05/18/18 12:10 AM  Result Value Ref Range   Glucose-Capillary 145 (H) 70 - 99 mg/dL  Comprehensive metabolic panel     Status: Abnormal   Collection Time: 05/18/18  3:56 AM  Result Value Ref Range   Sodium 141 135 - 145 mmol/L   Potassium 4.3 3.5 - 5.1 mmol/L   Chloride 111 98 - 111 mmol/L   CO2 23 22 - 32 mmol/L   Glucose, Bld 195 (H) 70 - 99 mg/dL   BUN 32 (H) 8 - 23 mg/dL   Creatinine, Ser 1.21 (H) 0.44 - 1.00 mg/dL   Calcium 8.6 (L) 8.9 - 10.3 mg/dL   Total Protein 5.9 (L) 6.5 - 8.1 g/dL   Albumin 1.9 (L) 3.5 - 5.0 g/dL    AST 21 15 - 41 U/L   ALT 22 0 - 44 U/L   Alkaline Phosphatase 87 38 - 126 U/L   Total Bilirubin 0.5 0.3 - 1.2 mg/dL   GFR calc non Af Amer 45 (L) >60 mL/min   GFR calc Af Amer 52 (L) >60 mL/min    Comment: (NOTE) The eGFR has been calculated using the CKD EPI equation. This calculation has not been validated in all clinical situations. eGFR's persistently <60 mL/min signify possible Chronic Kidney Disease.    Anion gap 7 5 - 15    Comment: Performed at [facility], [address] [place], [place] [address]  CBC     Status: Abnormal   Collection Time: 05/18/18  3:56 AM  Result Value Ref Range   WBC 20.5 (H) 4.0 - 10.5 K/uL   RBC 3.16 (L) 3.87 - 5.11 MIL/uL   Hemoglobin 8.8 (L) 12.0 - 15.0 g/dL   HCT 28.4 (L) 36.0 - 46.0 %   MCV 89.9 80.0 - 100.0 fL   MCH 27.8 26.0 - 34.0 pg   MCHC 31.0 30.0 - 36.0 g/dL   RDW 14.3 11.5 - 15.5 %   Platelets 332 150 - 400 K/uL   nRBC 0.1 0.0 - 0.2 %    Comment: Performed at [facility], [address], [place], [place] [address]  Phosphorus     Status: None   Collection Time: 05/18/18  3:56 AM  Result Value Ref Range   Phosphorus 3.7 2.5 - 4.6 mg/dL    Comment: Performed at [facility], [address], [place], [place] [address]  Glucose, capillary     Status: Abnormal   Collection Time: 05/18/18  4:17 AM  Result Value Ref Range   Glucose-Capillary 168 (H) 70 - 99 mg/dL  Blood gas, arterial     Status: Abnormal   Collection Time: 05/18/18  5:33 AM  Result Value Ref Range   FIO2 100.00    Delivery systems VENTILATOR    Mode PRESSURE REGULATED VOLUME CONTROL    VT 360 mL   LHR 18 resp/min   Peep/cpap 8.0 cm H20   pH, Arterial 7.342 (L) 7.350 - 7.450   pCO2 arterial 44.1 32.0 - 48.0 mmHg   pO2, Arterial 126 (H) 83.0 - 108.0 mmHg   Bicarbonate 22.8 20.0 - 28.0 mmol/L   Acid-base deficit 1.6 0.0 - 2.0 mmol/L   O2 Saturation 98.2 %   Patient temperature 37.0  Collection site LEFT RADIAL    Drawn by [phone]    Sample type ARTERIAL DRAW     Allens test (pass/fail) PASS PASS    Comment: Performed at [facility], [address], [place], [place] [address]  Glucose, capillary     Status: Abnormal   Collection Time: 05/18/18  8:02 AM  Result Value Ref Range   Glucose-Capillary 160 (H) 70 - 99 mg/dL  Glucose, capillary     Status: Abnormal   Collection Time: 05/18/18 11:31 AM  Result Value Ref Range   Glucose-Capillary 172 (H) 70 - 99 mg/dL  Glucose, capillary     Status: Abnormal   Collection Time: 05/18/18  4:32 PM  Result Value Ref Range   Glucose-Capillary 164 (H) 70 - 99 mg/dL   Comment 1 QC Due   Glucose, capillary     Status: Abnormal   Collection Time: 05/18/18  8:25 PM  Result Value Ref Range   Glucose-Capillary 187 (H) 70 - 99 mg/dL  Glucose, capillary     Status: Abnormal   Collection Time: 05/19/18 12:27 AM  Result Value Ref Range   Glucose-Capillary 174 (H) 70 - 99 mg/dL  Glucose, capillary     Status: Abnormal   Collection Time: 05/19/18  4:46 AM  Result Value Ref Range   Glucose-Capillary 158 (H) 70 - 99 mg/dL  Basic metabolic panel     Status: Abnormal   Collection Time: 05/19/18  5:33 AM  Result Value Ref Range   Sodium 143 135 - 145 mmol/L   Potassium 4.8 3.5 - 5.1 mmol/L   Chloride 112 (H) 98 - 111 mmol/L   CO2 24 22 - 32 mmol/L   Glucose, Bld 182 (H) 70 - 99 mg/dL   BUN 56 (H) 8 - 23 mg/dL   Creatinine, Ser 1.43 (H) 0.44 - 1.00 mg/dL   Calcium 8.6 (L) 8.9 - 10.3 mg/dL   GFR calc non Af Amer 36 (L) >60 mL/min   GFR calc Af Amer 42 (L) >60 mL/min    Comment: (NOTE) The eGFR has been calculated using the CKD EPI equation. This calculation has not been validated in all clinical situations. eGFR's persistently <60 mL/min signify possible Chronic Kidney Disease.    Anion gap 7 5 - 15    Comment: Performed at [facility], [address], [place], [place] [address]  CBC with Differential/Platelet     Status: Abnormal   Collection Time: 05/19/18  5:33 AM  Result Value Ref Range   WBC 24.2 (H)  4.0 - 10.5 K/uL    Comment: WHITE COUNT CONFIRMED ON SMEAR   RBC 3.30 (L) 3.87 - 5.11 MIL/uL   Hemoglobin 9.4 (L) 12.0 - 15.0 g/dL   HCT 31.0 (L) 36.0 - 46.0 %   MCV 93.9 80.0 - 100.0 fL   MCH 28.5 26.0 - 34.0 pg   MCHC 30.3 30.0 - 36.0 g/dL   RDW 14.8 11.5 - 15.5 %   Platelets 235 150 - 400 K/uL   nRBC 0.2 0.0 - 0.2 %   Neutrophils Relative % 89 %   Neutro Abs 21.6 (H) 1.7 - 7.7 K/uL   Lymphocytes Relative 4 %   Lymphs Abs 1.0 0.7 - 4.0 K/uL   Monocytes Relative 4 %   Monocytes Absolute 1.0 0.1 - 1.0 K/uL   Eosinophils Relative 1 %   Eosinophils Absolute 0.2 0.0 - 0.5 K/uL   Basophils Relative 0 %   Basophils Absolute 0.0 0.0 - 0.1 K/uL  Immature Granulocytes 2 %   Abs Immature Granulocytes 0.40 (H) 0.00 - 0.07 K/uL    Comment: Performed at [facility], [address], [place], [place] [address]  Magnesium     Status: Abnormal   Collection Time: 05/19/18  5:33 AM  Result Value Ref Range   Magnesium 2.6 (H) 1.7 - 2.4 mg/dL    Comment: Performed at [facility], [address] [place], [place] [address]  Phosphorus     Status: Abnormal   Collection Time: 05/19/18  5:33 AM  Result Value Ref Range   Phosphorus 4.8 (H) 2.5 - 4.6 mg/dL    Comment: Performed at [facility], [address], [place], [place] [address]  Glucose, capillary     Status: Abnormal   Collection Time: 05/19/18  7:42 AM  Result Value Ref Range   Glucose-Capillary 143 (H) 70 - 99 mg/dL    ABGS Recent Labs    05/18/18 0533  PHART 7.342*  PO2ART 126*  HCO3 22.8   CULTURES Recent Results (from the past 240 hour(s))  Urine culture     Status: None   Collection Time: 05/15/2018 11:52 AM  Result Value Ref Range Status   Specimen Description   Final    URINE, RANDOM Performed at [facility], [address], [place], [place] [address]    Special Requests   Final    NONE Performed at [facility], [address], [place], [place] [address]    Culture   Final    NO GROWTH Performed at [facility], [place] [address], [place], [place] [address]    Report Status 05/12/2018 FINAL  Final  Culture, blood (routine x 2)     Status: None   Collection Time: 05/01/2018 12:38 PM  Result Value Ref Range Status   Specimen Description BLOOD RIGHT ARM  Final   Special Requests   Final    BOTTLES DRAWN AEROBIC AND ANAEROBIC Blood Culture adequate volume   Culture   Final    NO GROWTH 5 DAYS Performed at [facility], [address] [place], [place] [address]    Report Status 05/16/2018 FINAL  Final  Culture, blood (routine x 2)     Status: None   Collection Time: 05/06/2018 12:38 PM  Result Value Ref Range Status   Specimen Description BLOOD RIGHT HAND  Final   Special Requests   Final    BOTTLES DRAWN AEROBIC AND ANAEROBIC Blood Culture adequate volume   Culture   Final    NO GROWTH 5 DAYS Performed at [facility], [address], [place], [place] [address]    Report Status 05/16/2018 FINAL  Final  MRSA PCR Screening     Status: None   Collection Time: 05/15/18  7:17 AM  Result Value Ref Range Status   MRSA by PCR NEGATIVE NEGATIVE Final    Comment:        The GeneXpert MRSA Assay (FDA approved for NASAL specimens only), is one component of a comprehensive MRSA colonization surveillance program. It is not intended to diagnose MRSA infection nor to guide or monitor treatment for MRSA infections. Performed at [facility], [address], [place], [place] [address]   Respiratory Panel by PCR     Status: Abnormal   Collection Time: 05/17/18  5:45 PM  Result Value Ref Range Status   Adenovirus NOT DETECTED NOT DETECTED Final   Coronavirus 229E NOT DETECTED NOT DETECTED Final   Coronavirus HKU1 NOT DETECTED NOT DETECTED Final  Coronavirus NL63 NOT DETECTED NOT DETECTED Final   Coronavirus OC43 NOT DETECTED NOT DETECTED Final   Metapneumovirus NOT DETECTED NOT DETECTED Final   Rhinovirus / Enterovirus DETECTED (A) NOT DETECTED Final   Influenza A NOT DETECTED NOT DETECTED Final    Influenza B NOT DETECTED NOT DETECTED Final   Parainfluenza Virus 1 NOT DETECTED NOT DETECTED Final   Parainfluenza Virus 2 NOT DETECTED NOT DETECTED Final   Parainfluenza Virus 3 NOT DETECTED NOT DETECTED Final   Parainfluenza Virus 4 NOT DETECTED NOT DETECTED Final   Respiratory Syncytial Virus NOT DETECTED NOT DETECTED Final   Bordetella pertussis NOT DETECTED NOT DETECTED Final   Chlamydophila pneumoniae NOT DETECTED NOT DETECTED Final   Mycoplasma pneumoniae NOT DETECTED NOT DETECTED Final    Comment: Performed at [facility], [place] [address] [place], [place] [address]  Culture, respiratory (non-expectorated)     Status: None (Preliminary result)   Collection Time: 05/17/18  5:45 PM  Result Value Ref Range Status   Specimen Description   Final    TRACHEAL ASPIRATE Performed at [facility], [address], [place], [place] [address]    Special Requests   Final    NONE Performed at [facility], [address], [place], [place] [address]    Gram Stain   Final    RARE WBC PRESENT, PREDOMINANTLY PMN NO ORGANISMS SEEN Performed at [facility], [employer] [address] [place], [place] [address]    Culture PENDING  Incomplete   Report Status PENDING  Incomplete   Studies/Results: Dg Chest Port 1 View  Result Date: 05/18/2018 CLINICAL DATA:  Recent pneumonia EXAM: PORTABLE CHEST 1 VIEW COMPARISON:  Yesterday FINDINGS: Endotracheal tube tip at the clavicular heads. An orogastric tube reaches the stomach at least. Right upper extremity PICC in the interim with tip at the SVC. Extensive interstitial and airspace opacity with a patchy appearance. No visible effusion or pneumothorax. Normal heart size. Artifact from EKG leads. IMPRESSION: 1. Unremarkable hardware positioning. 2. Multifocal pneumonia with possible edema. Electronically Signed   By: Monte Fantasia M.D.   On: 05/18/2018 07:18   Portable Chest X-ray  Result Date: 05/17/2018 CLINICAL DATA:  Evaluate endotracheal  tube placement EXAM: PORTABLE CHEST 1 VIEW COMPARISON:  Portable chest x-ray of 05/17/2017 FINDINGS: Endotracheal tube has now been placed with the tip approximately 2.7 cm above the carina. There is little change in patchy lung opacities greatest in the right mid and upper lung which appears most typical of pneumonia. However as noted on the prior dictation, superimposed edema cannot be excluded. Mild cardiomegaly is stable. IMPRESSION: 1. Tip of endotracheal tube 2.7 cm above the carina. 2. Little change in lung opacities as noted previously suspicious for pneumonia and possibly superimposed edema. Electronically Signed   By: Ivar Drape M.D.   On: 05/17/2018 10:12   Dg Chest Port 1 View  Result Date: 05/17/2018 CLINICAL DATA:  Respiratory failure EXAM: PORTABLE CHEST 1 VIEW COMPARISON:  05/16/2018 FINDINGS: Right upper lobe and left lower lobe opacities, suspicious for pneumonia. Superimposed interstitial opacities, suspicious for interstitial edema. No definite pleural effusions. The heart is normal in size. No pneumothorax. IMPRESSION: Multifocal opacities, right upper lobe predominant, suspicious for pneumonia. Superimposed interstitial edema is also suspected. Overall appearance is similar to the prior. Electronically Signed   By: Julian Hy M.D.   On: 05/17/2018 09:12   [place] Ekg Site Rite  Result Date: 05/17/2018 If Site Rite image not attached, placement could not be  confirmed due to current cardiac rhythm.   Medications:  Prior to Admission:  Medications Prior to Admission  Medication Sig Dispense Refill Last Dose  . ALPRAZolam (XANAX) 0.5 MG tablet Take 0.5 mg by mouth daily as needed.    Past Month at Unknown time  . aspirin 81 MG tablet Take 81 mg by mouth every other day.    Past Week at Unknown time  . cetirizine (ZYRTEC ALLERGY) 10 MG tablet Take 10 mg by mouth daily.   Past Week at Unknown time  . doxycycline (VIBRA-TABS) 100 MG tablet Take 100 mg by mouth 2 (two) times  daily.   05/10/2018 at 1800  . levothyroxine (SYNTHROID, LEVOTHROID) 50 MCG tablet Take 50 mcg by mouth as directed. Patient takes 66mg on Saturday and Sunday; all other days she takes 793m   05/07/2018 at 1000  . levothyroxine (SYNTHROID, LEVOTHROID) 75 MCG tablet Take 75 mcg by mouth as directed. Patient takes 7574mevery day except Saturday and Sunday; on Saturday and Sunday, she takes 60m65m 05/18/2018 at 1000  . lisinopril (PRINIVIL,ZESTRIL) 20 MG tablet TAKE 1/2 TABLET BY MOUTH ONCE DAILY. 45 tablet 3 05/10/2018 at 1800  . pravastatin (PRAVACHOL) 40 MG tablet TAKE ONE TABLET BY MOUTH IN THE EVENING. 90 tablet 2 05/10/2018 at 1800  . Pseudoephedrine-Guaifenesin (MUCINEX D MAX STRENGTH) [phone] MG TB12 Take 1 tablet by mouth daily.   05/10/2018 at Unknown time  . verapamil (VERELAN PM) 240 MG 24 hr capsule TAKE (1) CAPSULE BY MOUTH AT BEDTIME. 30 capsule 2 05/10/2018 at 1800  . zolpidem (AMBIEN) 10 MG tablet Take 10 mg by mouth at bedtime.    05/10/2018 at Unknown time   Scheduled: . acetylcysteine  3 mL Nebulization Q6H  . aspirin EC  81 mg Oral Q48H  . budesonide (PULMICORT) nebulizer solution  0.5 mg Nebulization BID  . chlorhexidine gluconate (MEDLINE KIT)  15 mL Mouth Rinse BID  . Chlorhexidine Gluconate Cloth  6 each Topical Daily  . enoxaparin (LOVENOX) injection  40 mg Subcutaneous Q24H  . feeding supplement (PRO-STAT SUGAR FREE 64)  30 mL Per Tube Daily  . feeding supplement (VITAL AF 1.2 CAL)  1,000 mL Per Tube Q24H  . fentaNYL (SUBLIMAZE) injection  50 mcg Intravenous Once  . ipratropium  0.5 mg Nebulization Q6H  . levalbuterol  1.25 mg Nebulization Q6H  . levothyroxine  37.5 mcg Intravenous Daily  . mouth rinse  15 mL Mouth Rinse 10 times per day  . methylPREDNISolone (SOLU-MEDROL) injection  60 mg Intravenous Q6H  . pantoprazole (PROTONIX) IV  40 mg Intravenous Q24H  . sodium chloride flush  10-40 mL Intracatheter Q12H  . sodium chloride flush  3 mL Intravenous Q12H    Continuous: . sodium chloride 10 mL/hr at 05/19/18 0400  . ceFEPime (MAXIPIME) IV 2 g (05/18/18 2229)  . fentaNYL infusion INTRAVENOUS 150 mcg/hr (05/19/18 0400)  . levofloxacin ([facility] Stopped (05/17/18 0933)   PRN:FBP:ZWCHENoride, acetaminophen **OR** acetaminophen, albuterol, fentaNYL, fentaNYL (SUBLIMAZE) injection, midazolam, ondansetron **OR** ondansetron (ZOFRAN) IV, sodium chloride flush  Assesment: She was admitted with acute hypoxic respiratory failure and multifocal pneumonia.  She initially was septic but seems better as far as that is concerned.  She initially improved then developed increasing problems with oxygenation and respiratory distress eventually culminating her being intubated and placed on mechanical ventilation.  She remains on 100% oxygen now although she was able to come down to 70% earlier.  Her oxygen requirement increased after she had  taken a bath etc.  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  Continue steroids.  Continue inhaled bronchodilators.  Continue Mucomyst.  Wean oxygen as we can.  Chest x-ray and blood gas are pending    LOS: 7 days   Destine Ambroise L 05/19/2018, 8:13 AM

## 2018-05-19 NOTE — Care Management Important Message (Signed)
Important Message  Patient Details  Name: Ma RingsBrenda C Machamer MRN: [id number] Date of Birth: Dec 02, 1950   Medicare Important Message Given:  Yes    Malcolm MetroChildress, Lanell Carpenter Demske, RN 05/19/2018, 1:57 PM

## 2018-05-19 NOTE — Progress Notes (Signed)
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 She had gone to her PCP a few days prior for a week of fevers, chills, increasing cough, and diagnosed with pneumonia, started on doxycycline and nebulized breathing treatments at home but has had no improvement. Over the past 24 hours she's grown increasingly weak, getting up out of bed rarely, much more short of breath when she attempts this and constantly feels like she's about to pass out. On initial evaluation she was afebrile, but was 100.7Fat home prior to taking tylenol. Tachycardic with soft BP, hypoxic to 88% on room air, improved with 2L O2, tachypneic. WBC 11.3k, flu negative, lactic acid elevated. Levaquin and IV fluids given after blood cultures drawn.She continued to be hypoxic and developed wheezing for which steroids and breathing treatments were provided. Respiratory effort increased and hypoxia worsened prompting pulmonology consultation and transfer to SDU/ICU on 11/18.BiPAP started with improvement in oxygenation. The patient did not show any improvement on BiPAP. She was intubated on 05/17/18.  Assessment/Plan: Acute hypoxic respiratory failure: -Due to multifocal pneumonia, bronchospasm and an element of interstitial edema. - Continue broad antimicrobial coverage,   -Stopped vancomycin as she failed an adequate trial of doxycycline, MRSA negative. -continue solumedrol to 60 IV q 6 - Intubated 05/17/18 -received lasix 11/18, 11/19, 11/21 -set up CVP--now 10-12 -very little reserve--went back to FiO2 70>>>100% with bathing -repeat CXR  Sepsis due to multilobar pneumonia: -Flu negative. -RVP =  rhino/entervirus - Continuebroad antibiotics.PCT reassuring at 0.19 > 0.16 > 0.13 and undetectable on 11/18. -Blood cultures negative. -tracheal aspirate for culture -spike fever up to 101.1 early this am -repeat blood culture -repeat PCT -d/c levofloxacin -continue cefepime  COPD Exacerbation -pt has at least 20 pack year history of tobacco -continue solumedrol -conitinue pulmicort -continue bronchodilators  FEN -placed OG -continue enteral feeding -check mag --2.6 -check phos--4.8  HTN: - Continue to hold lisinopril  Tachycardia -question intermittent atrial tachycardia/SVT -Troponin negative -EKG--sinus--nonspecific T wave changes -change albuterol to xopenex  Hypothyroidism:  - Continue home variable synthroid dosing. -change synthroid to IV  Thrombocytosis: -Reactive, monitor. -trending down  Leukocytosis: -Difficult to distinguish between infection vs. steroid-induced.  - Continue monitoring. -pt remains afebrile and hemodynamically stable -repeat procalcitonin  Stage III CKD: -Creatinine at baseline on admission.Had lasix, vancomycin, and contrast but seems to be stable. -Continues to be stable, will monitor with redosing lasix. -baseline creatinine 0.8-1.1  Carotid stenosis: -Continue QOD ASA.  Hyperlipidemia: -Continue statin    Disposition Plan:remain in ICU Family Communication: Family at bedside updated 11/22  Consultants:pulmonary  Code Status: FULL   DVT Prophylaxis:[place] Lovenox   Procedures: As Listed in Progress Note Above  Antibiotics: vanco 11/18 levoflox 11/14>>> Cefepime 11/18>>>   The patient is critically ill with multiple organ systems failure and requires high complexity decision making for assessment and support, frequent evaluation and titration of therapies, application of advanced monitoring technologies and extensive interpretation of multiple databases.  Critical care  time -35 mins.     Subjective: Pt remains intubated and sedated.  No vomiting or diarrhea.  Less responsive today.  fiO2 was down to 70%, but then had to go back to 100% after bathing pt.  Objective: Vitals:   05/19/18 0743 05/19/18 0800 05/19/18 0832 05/19/18 0900  BP:  (!) 115/57  120/60  Pulse:  (!) 112  (!) 130  Resp:  17  13  Temp: 100.3 F (37.9 C)     TempSrc:      SpO2:  99% 98% 97%  Weight:      Height:        Intake/Output Summary (Last 24 hours) at 05/19/2018 1037 Last data filed at 05/19/2018 1012 Gross per 24 hour  Intake 1789.07 ml  Output 2390 ml  Net -600.93 ml   Weight change: -0.1 kg Exam:   General:  Pt is sedated on ventilator, not in acute distress  HEENT: No icterus, No thrush, No neck mass, [place]/AT  Cardiovascular: RRR, S1/S2, no rubs, no gallops  Respiratory:bibasilar rhonchi.  No wheeze.    Abdomen: Soft/+BS,non distended, no guarding  Extremities: Trace :E edema, No lymphangitis, No petechiae, No rashes, no synovitis   Data Reviewed: I have personally reviewed following labs and imaging studies Basic Metabolic Panel: Recent Labs  Lab 05/15/18 0801 05/16/18 0607 05/17/18 0413 05/17/18 1026 05/18/18 0356 05/19/18 0533  NA 135 137 140  --  141 143  K 4.1 4.1 3.6  --  4.3 4.8  CL 107 108 109  --  111 112*  CO2 19* 21* 22  --  23 24  GLUCOSE 124* 141* 142*  --  195* 182*  BUN _0 --  32* 56*  CREATININE 0.94 0.97 1.02*  --  1.21* 1.43*  CALCIUM 8.2* 8.2* 8.3*  --  8.6* 8.6*  MG  --   --   --  2.1  --  2.6*  PHOS  --   --   --  4.0 3.7 4.8*   Liver Function Tests: Recent Labs  Lab 05/15/18 0801 05/18/18 0356  AST 30 21  ALT 16 22  ALKPHOS 73 87  BILITOT 0.4 0.5  PROT 6.2* 5.9*  ALBUMIN 2.0* 1.9*   No results for input(s): LIPASE, AMYLASE in the last 168 hours. No results for input(s): AMMONIA in the last 168 hours. Coagulation Profile: No results for input(s): INR, PROTIME in the last 168  hours. CBC: Recent Labs  Lab 05/15/18 0801 05/16/18 0607 05/17/18 0413 05/18/18 0356 05/19/18 0533  WBC 26.4* 20.1* 19.3* 20.5* 24.2*  NEUTROABS  --   --   --   --  21.6*  HGB 9.6* 9.2* 8.8* 8.8* 9.4*  HCT 30.4* 28.8* 27.7* 28.4* 31.0*  MCV 92.7 89.4 87.7 89.9 93.9  PLT 576* 501* 427* 332 235   Cardiac Enzymes: No results for input(s): CKTOTAL, CKMB, CKMBINDEX, TROPONINI in the last 168 hours. BNP: Invalid input(s): POCBNP CBG: Recent Labs  Lab 05/18/18 1632 05/18/18 2025 05/19/18 0027 05/19/18 0446 05/19/18 0742  GLUCAP 164* 187* 174* 158* 143*   HbA1C: No results for input(s): HGBA1C in the last 72 hours. Urine analysis:    Component Value Date/Time   COLORURINE YELLOW 04/29/2018 [place] 05/12/2018 1152   LABSPEC 1.010 04/29/2018 1152   PHURINE 6.0 05/03/2018 1152   GLUCOSEU NEGATIVE 05/04/2018 1152   HGBUR NEGATIVE 05/21/2018 1152   BILIRUBINUR NEGATIVE 05/19/2018 1152   KETONESUR NEGATIVE 05/03/2018 1152   PROTEINUR NEGATIVE 05/08/2018 1152   UROBILINOGEN 0.2 01/07/2010 1316   NITRITE NEGATIVE 04/28/2018 1152   LEUKOCYTESUR TRACE (A) 05/04/2018 1152   Sepsis Labs: _1 (procalcitonin:4,lacticidven:4) ) Recent Results (from the past 240 hour(s))  Urine culture  Status: None   Collection Time: 05/06/2018 11:52 AM  Result Value Ref Range Status   Specimen Description   Final    URINE, RANDOM Performed at [facility], [address] [place], [place] [address]    Special Requests   Final    NONE Performed at [facility], [address], [place], [place] [address]    Culture   Final    NO GROWTH Performed at [facility], [place] [address], [place], [place] [address]    Report Status 05/12/2018 FINAL  Final  Culture, blood (routine x 2)     Status: None   Collection Time: 05/01/2018 12:38 PM  Result Value Ref Range Status   Specimen Description BLOOD RIGHT ARM  Final   Special Requests   Final    BOTTLES DRAWN AEROBIC  AND ANAEROBIC Blood Culture adequate volume   Culture   Final    NO GROWTH 5 DAYS Performed at [facility], [address], [place], [place] [address]    Report Status 05/16/2018 FINAL  Final  Culture, blood (routine x 2)     Status: None   Collection Time: 05/10/2018 12:38 PM  Result Value Ref Range Status   Specimen Description BLOOD RIGHT HAND  Final   Special Requests   Final    BOTTLES DRAWN AEROBIC AND ANAEROBIC Blood Culture adequate volume   Culture   Final    NO GROWTH 5 DAYS Performed at [facility], [address], [place], [place] [address]    Report Status 05/16/2018 FINAL  Final  MRSA PCR Screening     Status: None   Collection Time: 05/15/18  7:17 AM  Result Value Ref Range Status   MRSA by PCR NEGATIVE NEGATIVE Final    Comment:        The GeneXpert MRSA Assay (FDA approved for NASAL specimens only), is one component of a comprehensive MRSA colonization surveillance program. It is not intended to diagnose MRSA infection nor to guide or monitor treatment for MRSA infections. Performed at [facility], [address], [place], [place] [address]   Respiratory Panel by PCR     Status: Abnormal   Collection Time: 05/17/18  5:45 PM  Result Value Ref Range Status   Adenovirus NOT DETECTED NOT DETECTED Final   Coronavirus 229E NOT DETECTED NOT DETECTED Final   Coronavirus HKU1 NOT DETECTED NOT DETECTED Final   Coronavirus NL63 NOT DETECTED NOT DETECTED Final   Coronavirus OC43 NOT DETECTED NOT DETECTED Final   Metapneumovirus NOT DETECTED NOT DETECTED Final   Rhinovirus / Enterovirus DETECTED (A) NOT DETECTED Final   Influenza A NOT DETECTED NOT DETECTED Final   Influenza B NOT DETECTED NOT DETECTED Final   Parainfluenza Virus 1 NOT DETECTED NOT DETECTED Final   Parainfluenza Virus 2 NOT DETECTED NOT DETECTED Final   Parainfluenza Virus 3 NOT DETECTED NOT DETECTED Final   Parainfluenza Virus 4 NOT DETECTED NOT DETECTED Final   Respiratory Syncytial Virus NOT  DETECTED NOT DETECTED Final   Bordetella pertussis NOT DETECTED NOT DETECTED Final   Chlamydophila pneumoniae NOT DETECTED NOT DETECTED Final   Mycoplasma pneumoniae NOT DETECTED NOT DETECTED Final    Comment: Performed at [facility], [place] [address], [place], [place] [address]  Culture, respiratory (non-expectorated)     Status: None (Preliminary result)   Collection Time: 05/17/18  5:45 PM  Result Value Ref Range Status   Specimen Description   Final    TRACHEAL ASPIRATE Performed at  [address] [place], [place] [address]    Special Requests   Final    NONE Performed at [facility], [address] [place], [place] [address]    Gram Stain   Final    RARE WBC PRESENT, PREDOMINANTLY PMN NO ORGANISMS SEEN Performed at [facility], [place] [address] [place], [place] [address]    Culture PENDING  Incomplete   Report Status PENDING  Incomplete     Scheduled Meds: . acetylcysteine  3 mL Nebulization Q6H  . aspirin EC  81 mg Oral Q48H  . budesonide (PULMICORT) nebulizer solution  0.5 mg Nebulization BID  . chlorhexidine gluconate (MEDLINE KIT)  15 mL Mouth Rinse BID  . Chlorhexidine Gluconate Cloth  6 each Topical Daily  . enoxaparin (LOVENOX) injection  40 mg Subcutaneous Q24H  . feeding supplement (PRO-STAT SUGAR FREE 64)  30 mL Per Tube Daily  . feeding supplement (VITAL AF 1.2 CAL)  1,000 mL Per Tube Q24H  . fentaNYL (SUBLIMAZE) injection  50 mcg Intravenous Once  . ipratropium  0.5 mg Nebulization Q6H  . levalbuterol  1.25 mg Nebulization Q6H  . levothyroxine  37.5 mcg Intravenous Daily  . mouth rinse  15 mL Mouth Rinse 10 times per day  . methylPREDNISolone (SOLU-MEDROL) injection  60 mg Intravenous Q6H  . pantoprazole (PROTONIX) IV  40 mg Intravenous Q24H  . sodium chloride flush  10-40 mL Intracatheter Q12H  . sodium chloride flush  3 mL Intravenous Q12H   Continuous Infusions: . sodium chloride Stopped (05/19/18 0851)  . ceFEPime  (MAXIPIME) IV Stopped (05/18/18 2259)  . fentaNYL infusion INTRAVENOUS 50 mcg/hr (05/19/18 0900)    Procedures/Studies: Dg Chest 2 View  Result Date: 05/17/2018 CLINICAL DATA:  Recent pneumonia with persistent cough, fever, and shortness of breath. EXAM: CHEST - 2 VIEW COMPARISON:  May 08, 2018 FINDINGS: Extensive airspace disease is noted throughout much of the right upper and lower lobe regions. There is also patchy infiltrate in the left lower lobe. The degree of consolidation on the right is stable. There is slight the increased consolidation in the left base. Heart size and pulmonary vascularity are normal. No adenopathy. No bone lesions. IMPRESSION: Multifocal pneumonia. Areas of airspace opacity in the right upper and lower lobes appears essentially stable compared to 3 days prior. Increase in infiltrate left base. Stable cardiac silhouette. No adenopathy evident. Electronically Signed   By: Lowella Grip III M.D.   On: 05/10/2018 12:57   Dg Chest 2 View  Result Date: 05/08/2018 CLINICAL DATA:  Productive cough and fever for a couple weeks, former smoker, history hypertension, cardiomyopathy EXAM: CHEST - 2 VIEW COMPARISON:  None FINDINGS: Normal heart size, mediastinal contours, and pulmonary vascularity. Scattered opacities throughout the RIGHT lung likely representing pneumonia. However these are somewhat patchy/nodular in appearance and require follow-up until resolution to exclude underlying abnormalities including tumor. Minimal atelectasis versus consolidation in LEFT lower lobe. Remaining LEFT lung clear. Elevation of RIGHT diaphragm. No pleural effusion or pneumothorax. IMPRESSION: Predominately RIGHT-sided pulmonary infiltrates consistent with pneumonia. Followup PA and lateral chest X-ray is recommended in 3-4 weeks following trial of antibiotic therapy to ensure resolution and exclude underlying malignancy. These results will be called to the ordering clinician or  representative by the Radiologist Assistant, and communication documented in the PACS or zVision Dashboard. Electronically Signed   By: Lavonia Dana M.D.   On: 05/08/2018 11:45   Ct Angio Chest Pe W Or Wo Contrast  Result Date:  05/14/2018 CLINICAL DATA:  Shortness of breath worsening today. Clinical concern for pulmonary embolus. Positive D-dimer. EXAM: CT ANGIOGRAPHY CHEST WITH CONTRAST TECHNIQUE: Multidetector CT imaging of the chest was performed using the standard protocol during bolus administration of intravenous contrast. Multiplanar CT image reconstructions and MIPs were obtained to evaluate the vascular anatomy. CONTRAST:  61m ISOVUE-370 IOPAMIDOL (ISOVUE-370) INJECTION 76% COMPARISON:  None FINDINGS: Cardiovascular: The heart size is normal. No substantial pericardial effusion. No thoracic aortic aneurysm. No filling defect in the opacified pulmonary arteries to suggest the presence of an acute pulmonary embolus. Mediastinum/Nodes: 14 mm short axis subcarinal lymph node is associated with mild bilateral hilar lymphadenopathy. The esophagus has normal imaging features. There is no axillary lymphadenopathy. Lungs/Pleura: The central tracheobronchial airways are patent. Asymmetric patchy airspace disease, right greater than left is associated with interlobular septal thickening. Confluent airspace disease noted in the right lower lobe posterior left lower lobe. Small to moderate right pleural effusion evident with small left pleural effusion. Upper Abdomen: The liver shows diffusely decreased attenuation suggesting steatosis. Musculoskeletal: No worrisome lytic or sclerotic osseous abnormality. Review of the MIP images confirms the above findings. IMPRESSION: 1. No CT evidence for acute pulmonary embolus. 2. Bilateral patchy, right greater than left airspace disease compatible with multifocal pneumonia. 3. Mild mediastinal and bilateral hilar lymphadenopathy. 4. Small to moderate bilateral pleural  effusions.  The Electronically Signed   By: EMisty StanleyM.D.   On: 05/14/2018 17:35   Dg Chest Port 1 View  Result Date: 05/19/2018 CLINICAL DATA:  Nasogastric tube placement. EXAM: PORTABLE CHEST 1 VIEW COMPARISON:  Radiograph of May 18, 2018. FINDINGS: Stable cardiomediastinal silhouette. Endotracheal and nasogastric tubes are unchanged in position. Right-sided PICC line is unchanged in position. No pneumothorax is noted. Mildly improved bilateral lung opacities are noted most consistent with improving edema. Small pleural effusions may be present. Bony thorax is unremarkable. IMPRESSION: Stable support apparatus. Probable mildly improved bilateral pulmonary edema. Electronically Signed   By: JMarijo Conception M.D.   On: 05/19/2018 10:23   Dg Chest Port 1 View  Result Date: 05/18/2018 CLINICAL DATA:  Recent pneumonia EXAM: PORTABLE CHEST 1 VIEW COMPARISON:  Yesterday FINDINGS: Endotracheal tube tip at the clavicular heads. An orogastric tube reaches the stomach at least. Right upper extremity PICC in the interim with tip at the SVC. Extensive interstitial and airspace opacity with a patchy appearance. No visible effusion or pneumothorax. Normal heart size. Artifact from EKG leads. IMPRESSION: 1. Unremarkable hardware positioning. 2. Multifocal pneumonia with possible edema. Electronically Signed   By: JMonte FantasiaM.D.   On: 05/18/2018 07:18   Portable Chest X-ray  Result Date: 05/17/2018 CLINICAL DATA:  Evaluate endotracheal tube placement EXAM: PORTABLE CHEST 1 VIEW COMPARISON:  Portable chest x-ray of 05/17/2017 FINDINGS: Endotracheal tube has now been placed with the tip approximately 2.7 cm above the carina. There is little change in patchy lung opacities greatest in the right mid and upper lung which appears most typical of pneumonia. However as noted on the prior dictation, superimposed edema cannot be excluded. Mild cardiomegaly is stable. IMPRESSION: 1. Tip of endotracheal tube 2.7  cm above the carina. 2. Little change in lung opacities as noted previously suspicious for pneumonia and possibly superimposed edema. Electronically Signed   By: PIvar DrapeM.D.   On: 05/17/2018 10:12   Dg Chest Port 1 View  Result Date: 05/17/2018 CLINICAL DATA:  Respiratory failure EXAM: PORTABLE CHEST 1 VIEW COMPARISON:  05/16/2018 FINDINGS: Right upper lobe and left  lower lobe opacities, suspicious for pneumonia. Superimposed interstitial opacities, suspicious for interstitial edema. No definite pleural effusions. The heart is normal in size. No pneumothorax. IMPRESSION: Multifocal opacities, right upper lobe predominant, suspicious for pneumonia. Superimposed interstitial edema is also suspected. Overall appearance is similar to the prior. Electronically Signed   By: Julian Hy M.D.   On: 05/17/2018 09:12   Dg Chest Port 1 View  Result Date: 05/16/2018 CLINICAL DATA:  Respiratory failure EXAM: PORTABLE CHEST 1 VIEW COMPARISON:  CTA chest dated 05/14/2018 FINDINGS: Multifocal patchy opacities, right upper lobe predominant, suspicious for pneumonia. Superimposed mild interstitial edema is possible. No definite pleural effusion. No pneumothorax. The heart is normal in size. IMPRESSION: Multifocal patchy opacities, right upper lobe predominant, suspicious for pneumonia. Superimposed mild interstitial edema is possible. These findings are better evaluated on recent CT. Electronically Signed   By: Julian Hy M.D.   On: 05/16/2018 09:32   [place] Ekg Site Rite  Result Date: 05/17/2018 If Site Rite image not attached, placement could not be confirmed due to current cardiac rhythm.   Orson Eva, DO  [facility] Pager [phone]  If 7PM-7AM, please contact night-coverage [email or web address] Password [facility] 05/19/2018, 10:37 AM   LOS: 7 days

## 2018-05-20 ENCOUNTER — Inpatient Hospital Stay (HOSPITAL_COMMUNITY): Payer: PPO

## 2018-05-20 LAB — LEGIONELLA PNEUMOPHILA SEROGP 1 UR AG: L. PNEUMOPHILA SEROGP 1 UR AG: NEGATIVE

## 2018-05-20 LAB — BLOOD GAS, ARTERIAL
Acid-base deficit: 0.2 mmol/L (ref 0.0–2.0)
Bicarbonate: 23.9 mmol/L (ref 20.0–28.0)
Drawn by: 105551
FIO2: 70
MECHVT: 600 mL
Mechanical Rate: 22
O2 Saturation: 96.7 %
PEEP: 8 cmH2O
RATE: 22 {breaths}/min
pCO2 arterial: 47.1 mmHg (ref 32.0–48.0)
pH, Arterial: 7.341 — ABNORMAL LOW (ref 7.350–7.450)
pO2, Arterial: 93 mmHg (ref 83.0–108.0)

## 2018-05-20 LAB — CULTURE, RESPIRATORY

## 2018-05-20 LAB — GLUCOSE, CAPILLARY
GLUCOSE-CAPILLARY: 191 mg/dL — AB (ref 70–99)
GLUCOSE-CAPILLARY: 196 mg/dL — AB (ref 70–99)
Glucose-Capillary: 162 mg/dL — ABNORMAL HIGH (ref 70–99)
Glucose-Capillary: 177 mg/dL — ABNORMAL HIGH (ref 70–99)
Glucose-Capillary: 178 mg/dL — ABNORMAL HIGH (ref 70–99)
Glucose-Capillary: 211 mg/dL — ABNORMAL HIGH (ref 70–99)

## 2018-05-20 LAB — COMPREHENSIVE METABOLIC PANEL
ALK PHOS: 71 U/L (ref 38–126)
ALT: 16 U/L (ref 0–44)
AST: 17 U/L (ref 15–41)
Albumin: 1.8 g/dL — ABNORMAL LOW (ref 3.5–5.0)
Anion gap: 5 (ref 5–15)
BILIRUBIN TOTAL: 0.5 mg/dL (ref 0.3–1.2)
BUN: 70 mg/dL — ABNORMAL HIGH (ref 8–23)
CALCIUM: 8.4 mg/dL — AB (ref 8.9–10.3)
CO2: 25 mmol/L (ref 22–32)
CREATININE: 1.39 mg/dL — AB (ref 0.44–1.00)
Chloride: 115 mmol/L — ABNORMAL HIGH (ref 98–111)
GFR, EST AFRICAN AMERICAN: 44 mL/min — AB (ref >60)
GFR, EST NON AFRICAN AMERICAN: 38 mL/min — AB (ref >60)
Glucose, Bld: 187 mg/dL — ABNORMAL HIGH (ref 70–99)
Potassium: 4.8 mmol/L (ref 3.5–5.1)
Sodium: 145 mmol/L (ref 135–145)
Total Protein: 5.7 g/dL — ABNORMAL LOW (ref 6.5–8.1)

## 2018-05-20 LAB — CBC
HCT: 28 % — ABNORMAL LOW (ref 36.0–46.0)
Hemoglobin: 8.4 g/dL — ABNORMAL LOW (ref 12.0–15.0)
MCH: 28.2 pg (ref 26.0–34.0)
MCHC: 30 g/dL (ref 30.0–36.0)
MCV: 94 fL (ref 80.0–100.0)
NRBC: 0.3 % — AB (ref 0.0–0.2)
PLATELETS: 162 10*3/uL (ref 150–400)
RBC: 2.98 MIL/uL — ABNORMAL LOW (ref 3.87–5.11)
RDW: 14.9 % (ref 11.5–15.5)
WBC: 22.4 10*3/uL — AB (ref 4.0–10.5)

## 2018-05-20 LAB — CULTURE, RESPIRATORY W GRAM STAIN

## 2018-05-20 LAB — PROCALCITONIN: PROCALCITONIN: 0.34 ng/mL

## 2018-05-20 MED ORDER — DILTIAZEM 12 MG/ML ORAL SUSPENSION
60.0000 mg | Freq: Four times a day (QID) | ORAL | Status: DC
  Filled 2018-05-20 (×7): qty 6

## 2018-05-20 MED ORDER — METOPROLOL TARTRATE 5 MG/5ML IV SOLN
2.5000 mg | Freq: Once | INTRAVENOUS | Status: AC
  Administered 2018-05-20: 2.5 mg via INTRAVENOUS
  Filled 2018-05-20: qty 5

## 2018-05-20 MED ORDER — INSULIN ASPART 100 UNIT/ML ~~LOC~~ SOLN
2.0000 [IU] | SUBCUTANEOUS | Status: DC
  Administered 2018-05-20 (×2): 4 [IU] via SUBCUTANEOUS
  Administered 2018-05-20 – 2018-05-21 (×2): 6 [IU] via SUBCUTANEOUS
  Administered 2018-05-21 (×6): 4 [IU] via SUBCUTANEOUS
  Administered 2018-05-22: 6 [IU] via SUBCUTANEOUS
  Administered 2018-05-22: 4 [IU] via SUBCUTANEOUS
  Administered 2018-05-22: 6 [IU] via SUBCUTANEOUS
  Administered 2018-05-22 – 2018-05-23 (×3): 4 [IU] via SUBCUTANEOUS
  Administered 2018-05-23: 2 [IU] via SUBCUTANEOUS
  Administered 2018-05-23: 4 [IU] via SUBCUTANEOUS
  Administered 2018-05-23: 2 [IU] via SUBCUTANEOUS
  Administered 2018-05-23: 4 [IU] via SUBCUTANEOUS
  Administered 2018-05-23: 6 [IU] via SUBCUTANEOUS
  Administered 2018-05-24: 4 [IU] via SUBCUTANEOUS
  Administered 2018-05-24: 2 [IU] via SUBCUTANEOUS

## 2018-05-20 MED ORDER — DILTIAZEM HCL 60 MG PO TABS: 60.0000 mg | ORAL_TABLET | Freq: Four times a day (QID) | ORAL | Status: DC

## 2018-05-20 NOTE — Progress Notes (Addendum)
Have increased Rate to 22, and VT to 600, FiO2 is at 70. This is far above 8cc/kg. All her vent waves seem appropriate. The inverse Ratio has stopped and negative peep Peak pressure is 35 , mean 24 , plateau is 30. Basically Vent is set at what Patient is overriding and trying to breath on her own. Will check Abg in hour.

## 2018-05-20 NOTE — Progress Notes (Signed)
Subjective: She remains intubated and on the ventilator.  Oxygenation is improving.  Family at bedside ventilator adjustments have been made and she does seem to be doing better  Objective: Vital signs in last 24 hours: Temp:  [98.7 F (37.1 C)-99.6 F (37.6 C)] 99.2 F (37.3 C) (11/23 0730) Pulse Rate:  [105-143] 106 (11/23 0730) Resp:  [12-26] 19 (11/23 0730) BP: (116-157)/(57-87) 129/64 (11/23 0700) SpO2:  [93 %-100 %] 96 % (11/23 0916) FiO2 (%):  [70 %-80 %] 70 % (11/23 0916) Weight:  [75.5 kg] 75.5 kg (11/23 0500) Weight change: 0.5 kg Last BM Date: 05/14/18  Intake/Output from previous day: 11/22 0701 - 11/23 0700 In: 538.1 [I.V.:154.2; NG/GT:234; IV Piggyback:149.9] Out: 1825 [Urine:1825]  PHYSICAL EXAM General appearance: Intubated sedated on mechanical ventilation Resp: Her chest is clear her than yesterday Cardio: regular rate and rhythm, S1, S2 normal, no murmur, click, rub or gallop GI: soft, non-tender; bowel sounds normal; no masses,  no organomegaly Extremities: extremities normal, atraumatic, no cyanosis or edema  Lab Results:  Results for orders placed or performed during the hospital encounter of 05/10/2018 (from the past 48 hour(s))  Glucose, capillary     Status: Abnormal   Collection Time: 05/18/18 11:31 AM  Result Value Ref Range   Glucose-Capillary 172 (H) 70 - 99 mg/dL  Glucose, capillary     Status: Abnormal   Collection Time: 05/18/18  4:32 PM  Result Value Ref Range   Glucose-Capillary 164 (H) 70 - 99 mg/dL   Comment 1 QC Due   Glucose, capillary     Status: Abnormal   Collection Time: 05/18/18  8:25 PM  Result Value Ref Range   Glucose-Capillary 187 (H) 70 - 99 mg/dL  Glucose, capillary     Status: Abnormal   Collection Time: 05/19/18 12:27 AM  Result Value Ref Range   Glucose-Capillary 174 (H) 70 - 99 mg/dL  Glucose, capillary     Status: Abnormal   Collection Time: 05/19/18  4:46 AM  Result Value Ref Range   Glucose-Capillary 158 (H) 70  - 99 mg/dL  Basic metabolic panel     Status: Abnormal   Collection Time: 05/19/18  5:33 AM  Result Value Ref Range   Sodium 143 135 - 145 mmol/L   Potassium 4.8 3.5 - 5.1 mmol/L   Chloride 112 (H) 98 - 111 mmol/L   CO2 24 22 - 32 mmol/L   Glucose, Bld 182 (H) 70 - 99 mg/dL   BUN 56 (H) 8 - 23 mg/dL   Creatinine, Ser 1.43 (H) 0.44 - 1.00 mg/dL   Calcium 8.6 (L) 8.9 - 10.3 mg/dL   GFR calc non Af Amer 36 (L) >60 mL/min   GFR calc Af Amer 42 (L) >60 mL/min    Comment: (NOTE) The eGFR has been calculated using the CKD EPI equation. This calculation has not been validated in all clinical situations. eGFR's persistently <60 mL/min signify possible Chronic Kidney Disease.    Anion gap 7 5 - 15    Comment: Performed at [facility], [address], [place], [place] [address]  CBC with Differential/Platelet     Status: Abnormal   Collection Time: 05/19/18  5:33 AM  Result Value Ref Range   WBC 24.2 (H) 4.0 - 10.5 K/uL    Comment: WHITE COUNT CONFIRMED ON SMEAR   RBC 3.30 (L) 3.87 - 5.11 MIL/uL   Hemoglobin 9.4 (L) 12.0 - 15.0 g/dL   HCT 31.0 (L) 36.0 - 46.0 %  MCV 93.9 80.0 - 100.0 fL   MCH 28.5 26.0 - 34.0 pg   MCHC 30.3 30.0 - 36.0 g/dL   RDW 14.8 11.5 - 15.5 %   Platelets 235 150 - 400 K/uL   nRBC 0.2 0.0 - 0.2 %   Neutrophils Relative % 89 %   Neutro Abs 21.6 (H) 1.7 - 7.7 K/uL   Lymphocytes Relative 4 %   Lymphs Abs 1.0 0.7 - 4.0 K/uL   Monocytes Relative 4 %   Monocytes Absolute 1.0 0.1 - 1.0 K/uL   Eosinophils Relative 1 %   Eosinophils Absolute 0.2 0.0 - 0.5 K/uL   Basophils Relative 0 %   Basophils Absolute 0.0 0.0 - 0.1 K/uL   Immature Granulocytes 2 %   Abs Immature Granulocytes 0.40 (H) 0.00 - 0.07 K/uL    Comment: Performed at [facility], [address] [place], [place] [address]  Magnesium     Status: Abnormal   Collection Time: 05/19/18  5:33 AM  Result Value Ref Range   Magnesium 2.6 (H) 1.7 - 2.4 mg/dL    Comment: Performed at [facility], [address] [place], [place] [address]  Phosphorus     Status: Abnormal   Collection Time: 05/19/18  5:33 AM  Result Value Ref Range   Phosphorus 4.8 (H) 2.5 - 4.6 mg/dL    Comment: Performed at [facility], [address], [place], [place] [address]  Glucose, capillary     Status: Abnormal   Collection Time: 05/19/18  7:42 AM  Result Value Ref Range   Glucose-Capillary 143 (H) 70 - 99 mg/dL  Culture, blood (Routine X 2) w Reflex to ID Panel     Status: None (Preliminary result)   Collection Time: 05/19/18 11:06 AM  Result Value Ref Range   Specimen Description BLOOD PICC LINE DRAWN BY RN    Special Requests      BOTTLES DRAWN AEROBIC AND ANAEROBIC Blood Culture adequate volume   Culture      NO GROWTH < 12 HOURS Performed at [facility], [address], [place],  [address]    Report Status PENDING   Procalcitonin - Baseline     Status: None   Collection Time: 05/19/18 11:07 AM  Result Value Ref Range   Procalcitonin 0.49 ng/mL    Comment:        Interpretation: PCT (Procalcitonin) <= 0.5 ng/mL: Systemic infection (sepsis) is not likely. Local bacterial infection is possible. (NOTE)       Sepsis PCT Algorithm           Lower Respiratory Tract                                      Infection PCT Algorithm    ----------------------------     ----------------------------         PCT < 0.25 ng/mL                PCT < 0.10 ng/mL         Strongly encourage             Strongly discourage   discontinuation of antibiotics    initiation of antibiotics    ----------------------------     -----------------------------       PCT 0.25 - 0.50 ng/mL            PCT 0.10 - 0.25 ng/mL  OR       >80% decrease in PCT            Discourage initiation of                                            antibiotics      Encourage discontinuation           of antibiotics    ----------------------------     -----------------------------         PCT >= 0.50 ng/mL              PCT 0.26 - 0.50  ng/mL               AND        <80% decrease in PCT             Encourage initiation of                                             antibiotics       Encourage continuation           of antibiotics    ----------------------------     -----------------------------        PCT >= 0.50 ng/mL                  PCT > 0.50 ng/mL               AND         increase in PCT                  Strongly encourage                                      initiation of antibiotics    Strongly encourage escalation           of antibiotics                                     -----------------------------                                           PCT <= 0.25 ng/mL                                                 OR                                        > 80% decrease in PCT                                     Discontinue / Do not initiate  antibiotics Performed at [facility], [address] [place], [place] [address]   Culture, blood (Routine X 2) w Reflex to ID Panel     Status: None (Preliminary result)   Collection Time: 05/19/18 11:09 AM  Result Value Ref Range   Specimen Description BLOOD LEFT HAND DRAWN BY RN    Special Requests      BOTTLES DRAWN AEROBIC AND ANAEROBIC Blood Culture results may not be optimal due to an excessive volume of blood received in culture bottles   Culture      NO GROWTH < 12 HOURS Performed at [facility], [address] [place], [place] [address]    Report Status PENDING   Glucose, capillary     Status: Abnormal   Collection Time: 05/19/18 11:43 AM  Result Value Ref Range   Glucose-Capillary 160 (H) 70 - 99 mg/dL  Urinalysis, Complete w Microscopic     Status: Abnormal   Collection Time: 05/19/18 11:45 AM  Result Value Ref Range   Color, Urine YELLOW YELLOW   APPearance HAZY (A) CLEAR   Specific Gravity, Urine 1.020 1.005 - 1.030   pH 6.0 5.0 - 8.0   Glucose, UA NEGATIVE NEGATIVE mg/dL   Hgb urine dipstick LARGE (A)  NEGATIVE   Bilirubin Urine NEGATIVE NEGATIVE   Ketones, ur NEGATIVE NEGATIVE mg/dL   Protein, ur 30 (A) NEGATIVE mg/dL   Nitrite NEGATIVE NEGATIVE   Leukocytes, UA NEGATIVE NEGATIVE   Squamous Epithelial / LPF 0-5 0 - 5   WBC, UA 0-5 0 - 5 WBC/hpf   RBC / HPF 6-10 0 - 5 RBC/hpf   Bacteria, UA FEW (A) NONE SEEN   Granular Casts, UA PRESENT     Comment: Performed at [facility], [address] [place], [place] [address]  Glucose, capillary     Status: Abnormal   Collection Time: 05/19/18  3:00 PM  Result Value Ref Range   Glucose-Capillary 160 (H) 70 - 99 mg/dL  Glucose, capillary     Status: Abnormal   Collection Time: 05/19/18  8:39 PM  Result Value Ref Range   Glucose-Capillary 185 (H) 70 - 99 mg/dL   Comment 1 Notify RN    Comment 2 Document in Chart   Glucose, capillary     Status: Abnormal   Collection Time: 05/19/18 11:54 PM  Result Value Ref Range   Glucose-Capillary 188 (H) 70 - 99 mg/dL   Comment 1 Notify RN    Comment 2 Document in Chart   Glucose, capillary     Status: Abnormal   Collection Time: 05/20/18  3:55 AM  Result Value Ref Range   Glucose-Capillary 177 (H) 70 - 99 mg/dL   Comment 1 Notify RN    Comment 2 Document in Chart   Blood gas, arterial     Status: Abnormal   Collection Time: 05/20/18  5:00 AM  Result Value Ref Range   FIO2 70.00    Delivery systems VENTILATOR    Mode PRESSURE REGULATED VOLUME CONTROL    VT 600 mL   LHR 22 resp/min   Peep/cpap 8.0 cm H20   pH, Arterial 7.341 (L) 7.350 - 7.450   pCO2 arterial 47.1 32.0 - 48.0 mmHg   pO2, Arterial 93.0 83.0 - 108.0 mmHg   Bicarbonate 23.9 20.0 - 28.0 mmol/L   Acid-base deficit 0.2 0.0 - 2.0 mmol/L   O2 Saturation 96.7 %   Collection site RADIAL    Drawn by [id number]    Sample type ARTERIAL  Allens test (pass/fail) PASS PASS   Mechanical Rate 22     Comment: Performed at [facility], [address], [place], [place] [address]  Procalcitonin     Status: None   Collection Time: 05/20/18   5:30 AM  Result Value Ref Range   Procalcitonin 0.34 ng/mL    Comment:        Interpretation: PCT (Procalcitonin) <= 0.5 ng/mL: Systemic infection (sepsis) is not likely. Local bacterial infection is possible. (NOTE)       Sepsis PCT Algorithm           Lower Respiratory Tract                                      Infection PCT Algorithm    ----------------------------     ----------------------------         PCT < 0.25 ng/mL                PCT < 0.10 ng/mL         Strongly encourage             Strongly discourage   discontinuation of antibiotics    initiation of antibiotics    ----------------------------     -----------------------------       PCT 0.25 - 0.50 ng/mL            PCT 0.10 - 0.25 ng/mL               OR       >80% decrease in PCT            Discourage initiation of                                            antibiotics      Encourage discontinuation           of antibiotics    ----------------------------     -----------------------------         PCT >= 0.50 ng/mL              PCT 0.26 - 0.50 ng/mL               AND        <80% decrease in PCT             Encourage initiation of                                             antibiotics       Encourage continuation           of antibiotics    ----------------------------     -----------------------------        PCT >= 0.50 ng/mL                  PCT > 0.50 ng/mL               AND         increase in PCT                  Strongly encourage  initiation of antibiotics    Strongly encourage escalation           of antibiotics                                     -----------------------------                                           PCT <= 0.25 ng/mL                                                 OR                                        > 80% decrease in PCT                                     Discontinue / Do not initiate                                              antibiotics Performed at [facility], [address], [place], [place] [address]   Comprehensive metabolic panel     Status: Abnormal   Collection Time: 05/20/18  5:30 AM  Result Value Ref Range   Sodium 145 135 - 145 mmol/L   Potassium 4.8 3.5 - 5.1 mmol/L   Chloride 115 (H) 98 - 111 mmol/L   CO2 25 22 - 32 mmol/L   Glucose, Bld 187 (H) 70 - 99 mg/dL   BUN 70 (H) 8 - 23 mg/dL   Creatinine, Ser 1.39 (H) 0.44 - 1.00 mg/dL   Calcium 8.4 (L) 8.9 - 10.3 mg/dL   Total Protein 5.7 (L) 6.5 - 8.1 g/dL   Albumin 1.8 (L) 3.5 - 5.0 g/dL   AST 17 15 - 41 U/L   ALT 16 0 - 44 U/L   Alkaline Phosphatase 71 38 - 126 U/L   Total Bilirubin 0.5 0.3 - 1.2 mg/dL   GFR calc non Af Amer 38 (L) >60 mL/min   GFR calc Af Amer 44 (L) >60 mL/min    Comment: (NOTE) The eGFR has been calculated using the CKD EPI equation. This calculation has not been validated in all clinical situations. eGFR's persistently <60 mL/min signify possible Chronic Kidney Disease.    Anion gap 5 5 - 15    Comment: Performed at [facility], [address], [place], [place] [address]  CBC     Status: Abnormal   Collection Time: 05/20/18  5:30 AM  Result Value Ref Range   WBC 22.4 (H) 4.0 - 10.5 K/uL   RBC 2.98 (L) 3.87 - 5.11 MIL/uL   Hemoglobin 8.4 (L) 12.0 - 15.0 g/dL   HCT 28.0 (L) 36.0 - 46.0 %   MCV 94.0 80.0 - 100.0 fL   MCH 28.2 26.0 - 34.0 pg   MCHC 30.0 30.0 -  36.0 g/dL   RDW 14.9 11.5 - 15.5 %   Platelets 162 150 - 400 K/uL   nRBC 0.3 (H) 0.0 - 0.2 %    Comment: Performed at [facility], [address], [place], [place] [address]  Glucose, capillary     Status: Abnormal   Collection Time: 05/20/18  7:28 AM  Result Value Ref Range   Glucose-Capillary 178 (H) 70 - 99 mg/dL    ABGS Recent Labs    05/20/18 0500  PHART 7.341*  PO2ART 93.0  HCO3 23.9   CULTURES Recent Results (from the past 240 hour(s))  Urine culture     Status: None   Collection Time: 04/30/2018 11:52 AM  Result Value Ref Range Status    Specimen Description   Final    URINE, RANDOM Performed at [facility], [address], [place], [place] [address]    Special Requests   Final    NONE Performed at [facility], [address], [place], [place] [address]    Culture   Final    NO GROWTH Performed at [facility], [place] [address], [place], [place] [address]    Report Status 05/12/2018 FINAL  Final  Culture, blood (routine x 2)     Status: None   Collection Time: 05/27/2018 12:38 PM  Result Value Ref Range Status   Specimen Description BLOOD RIGHT ARM  Final   Special Requests   Final    BOTTLES DRAWN AEROBIC AND ANAEROBIC Blood Culture adequate volume   Culture   Final    NO GROWTH 5 DAYS Performed at [facility], [address], [place], [place] [address]    Report Status 05/16/2018 FINAL  Final  Culture, blood (routine x 2)     Status: None   Collection Time: 05/23/2018 12:38 PM  Result Value Ref Range Status   Specimen Description BLOOD RIGHT HAND  Final   Special Requests   Final    BOTTLES DRAWN AEROBIC AND ANAEROBIC Blood Culture adequate volume   Culture   Final    NO GROWTH 5 DAYS Performed at [facility], [address] [place], [place] [address]    Report Status 05/16/2018 FINAL  Final  MRSA PCR Screening     Status: None   Collection Time: 05/15/18  7:17 AM  Result Value Ref Range Status   MRSA by PCR NEGATIVE NEGATIVE Final    Comment:        The GeneXpert MRSA Assay (FDA approved for NASAL specimens only), is one component of a comprehensive MRSA colonization surveillance program. It is not intended to diagnose MRSA infection nor to guide or monitor treatment for MRSA infections. Performed at [facility], [address] [place], [place] [address]   Respiratory Panel by PCR     Status: Abnormal   Collection Time: 05/17/18  5:45 PM  Result Value Ref Range Status   Adenovirus NOT DETECTED NOT DETECTED Final   Coronavirus 229E NOT DETECTED NOT DETECTED Final   Coronavirus HKU1 NOT  DETECTED NOT DETECTED Final   Coronavirus NL63 NOT DETECTED NOT DETECTED Final   Coronavirus OC43 NOT DETECTED NOT DETECTED Final   Metapneumovirus NOT DETECTED NOT DETECTED Final   Rhinovirus / Enterovirus DETECTED (A) NOT DETECTED Final   Influenza A NOT DETECTED NOT DETECTED Final   Influenza B NOT DETECTED NOT DETECTED Final   Parainfluenza Virus 1 NOT DETECTED NOT DETECTED Final   Parainfluenza Virus 2 NOT DETECTED NOT DETECTED Final   Parainfluenza Virus  3 NOT DETECTED NOT DETECTED Final   Parainfluenza Virus 4 NOT DETECTED NOT DETECTED Final   Respiratory Syncytial Virus NOT DETECTED NOT DETECTED Final   Bordetella pertussis NOT DETECTED NOT DETECTED Final   Chlamydophila pneumoniae NOT DETECTED NOT DETECTED Final   Mycoplasma pneumoniae NOT DETECTED NOT DETECTED Final    Comment: Performed at [facility], [place] [address] [place], [place] [address]  Culture, respiratory (non-expectorated)     Status: None (Preliminary result)   Collection Time: 05/17/18  5:45 PM  Result Value Ref Range Status   Specimen Description   Final    TRACHEAL ASPIRATE Performed at [facility], [address], [place],  [place] [address]    Special Requests   Final    NONE Performed at [facility], [address], [place], [place] [address]    Gram Stain   Final    RARE WBC PRESENT, PREDOMINANTLY PMN NO ORGANISMS SEEN    Culture   Final    FEW YEAST IDENTIFICATION TO FOLLOW Performed at [facility], [place] [address] [place], [place] [address]    Report Status PENDING  Incomplete  Culture, blood (Routine X 2) w Reflex to ID Panel     Status: None (Preliminary result)   Collection Time: 05/19/18 11:06 AM  Result Value Ref Range Status   Specimen Description BLOOD PICC LINE DRAWN BY RN  Final   Special Requests   Final    BOTTLES DRAWN AEROBIC AND ANAEROBIC Blood Culture adequate volume   Culture   Final    NO GROWTH < 12 HOURS Performed at [facility], [address],  [place], [place] [address]    Report Status PENDING  Incomplete  Culture, blood (Routine X 2) w Reflex to ID Panel     Status: None (Preliminary result)   Collection Time: 05/19/18 11:09 AM  Result Value Ref Range Status   Specimen Description BLOOD LEFT HAND DRAWN BY RN  Final   Special Requests   Final    BOTTLES DRAWN AEROBIC AND ANAEROBIC Blood Culture results may not be optimal due to an excessive volume of blood received in culture bottles   Culture   Final    NO GROWTH < 12 HOURS Performed at [facility], [address], [place], [place] [address]    Report Status PENDING  Incomplete   Studies/Results: Dg Chest Port 1 View  Result Date: 05/20/2018 CLINICAL DATA:  Hypoxia.  Acute respiratory failure EXAM: PORTABLE CHEST 1 VIEW COMPARISON:  Chest radiograph 05/19/2018 FINDINGS: ET tube terminates in the mid trachea. Right upper extremity PICC line tip projects over the superior vena cava. Enteric tube courses inferior to the diaphragm. Monitoring leads overlie the patient. Stable cardiac and mediastinal contours. Similar-appearing bilateral interstitial pulmonary opacities. No pleural effusion or pneumothorax. IMPRESSION: Support apparatus as above. Similar-appearing interstitial opacities favored to represent edema. Electronically Signed   By: Lovey Newcomer M.D.   On: 05/20/2018 08:58   Dg Chest Port 1 View  Result Date: 05/19/2018 CLINICAL DATA:  Nasogastric tube placement. EXAM: PORTABLE CHEST 1 VIEW COMPARISON:  Radiograph of May 18, 2018. FINDINGS: Stable cardiomediastinal silhouette. Endotracheal and nasogastric tubes are unchanged in position. Right-sided PICC line is unchanged in position. No pneumothorax is noted. Mildly improved bilateral lung opacities are noted most consistent with improving edema. Small pleural effusions may be present. Bony thorax is unremarkable. IMPRESSION: Stable support apparatus. Probable mildly improved bilateral pulmonary edema. Electronically Signed    By: Sabino Dick  Brooke Bonito, M.D.   On: 05/19/2018 10:23    Medications:  Prior to Admission:  Medications Prior to Admission  Medication Sig Dispense Refill Last Dose  . ALPRAZolam (XANAX) 0.5 MG tablet Take 0.5 mg by mouth daily as needed.    Past Month at Unknown time  . aspirin 81 MG tablet Take 81 mg by mouth every other day.    Past Week at Unknown time  . cetirizine (ZYRTEC ALLERGY) 10 MG tablet Take 10 mg by mouth daily.   Past Week at Unknown time  . doxycycline (VIBRA-TABS) 100 MG tablet Take 100 mg by mouth 2 (two) times daily.   05/10/2018 at 1800  . levothyroxine (SYNTHROID, LEVOTHROID) 50 MCG tablet Take 50 mcg by mouth as directed. Patient takes 34mg on Saturday and Sunday; all other days she takes 771m   05/07/2018 at 1000  . levothyroxine (SYNTHROID, LEVOTHROID) 75 MCG tablet Take 75 mcg by mouth as directed. Patient takes 7567mevery day except Saturday and Sunday; on Saturday and Sunday, she takes 77m25m 05/03/2018 at 1000  . lisinopril (PRINIVIL,ZESTRIL) 20 MG tablet TAKE 1/2 TABLET BY MOUTH ONCE DAILY. 45 tablet 3 05/10/2018 at 1800  . pravastatin (PRAVACHOL) 40 MG tablet TAKE ONE TABLET BY MOUTH IN THE EVENING. 90 tablet 2 05/10/2018 at 1800  . Pseudoephedrine-Guaifenesin (MUCINEX D MAX STRENGTH) [phone] MG TB12 Take 1 tablet by mouth daily.   05/10/2018 at Unknown time  . verapamil (VERELAN PM) 240 MG 24 hr capsule TAKE (1) CAPSULE BY MOUTH AT BEDTIME. 30 capsule 2 05/10/2018 at 1800  . zolpidem (AMBIEN) 10 MG tablet Take 10 mg by mouth at bedtime.    05/10/2018 at Unknown time   Scheduled: . acetylcysteine  3 mL Nebulization Q6H  . aspirin EC  81 mg Oral Q48H  . budesonide (PULMICORT) nebulizer solution  0.5 mg Nebulization BID  . chlorhexidine gluconate (MEDLINE KIT)  15 mL Mouth Rinse BID  . Chlorhexidine Gluconate Cloth  6 each Topical Daily  . diltiazem  30 mg Oral Q6H  . enoxaparin (LOVENOX) injection  40 mg Subcutaneous Q24H  . feeding supplement (PRO-STAT SUGAR  FREE 64)  30 mL Per Tube Daily  . feeding supplement (VITAL AF 1.2 CAL)  1,000 mL Per Tube Q24H  . fentaNYL (SUBLIMAZE) injection  50 mcg Intravenous Once  . ipratropium  0.5 mg Nebulization Q6H  . levalbuterol  1.25 mg Nebulization Q6H  . levothyroxine  37.5 mcg Intravenous Daily  . mouth rinse  15 mL Mouth Rinse 10 times per day  . methylPREDNISolone (SOLU-MEDROL) injection  60 mg Intravenous Q6H  . pantoprazole (PROTONIX) IV  40 mg Intravenous Q24H  . sodium chloride flush  10-40 mL Intracatheter Q12H  . sodium chloride flush  3 mL Intravenous Q12H   Continuous: . sodium chloride 10 mL/hr at 05/20/18 0714  . ceFEPime (MAXIPIME) IV Stopped (05/19/18 2133)  . fentaNYL infusion INTRAVENOUS 100 mcg/hr (05/20/18 0846)   PRN:XKG:YJEHUDoride, acetaminophen **OR** acetaminophen, albuterol, fentaNYL, fentaNYL (SUBLIMAZE) injection, midazolam, ondansetron **OR** ondansetron (ZOFRAN) IV, sodium chloride flush  Assesment: She was admitted with acute hypoxic respiratory failure with multifocal pneumonia.  This eventually culminated in her being intubated and placed on mechanical ventilation.  Her FiO2 is less.  Oxygenation is adequate.  Her chest x-ray is substantially better part of which may be because she got a better breath.  She does have some element of malnutrition and she is receiving tube feedings. 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  Continue to try to wean oxygen.    LOS: 8 days   Trapper Meech L 05/20/2018, 9:45 AM

## 2018-05-20 NOTE — Progress Notes (Addendum)
Morning abg results on Rate 22, VT 600, peep 8 , FiO2 70---- PH 7.341 , pCO2 47.1, pO2 93.0, HCO3 23.9, sat 96.7   Suspect patient is worsening pneumonia. Albumin is low , so third spacing likely, as noted.  Maybe starting to ARDS pattern - May need more critical care. Kidney' function decreasing. Suggests possible transfer to [facility]    Also please rewrite vent settings for change vt 600 and rate 22.

## 2018-05-20 NOTE — Progress Notes (Addendum)
Patient still breathes some over vent.Her respirations can be slowed easily by increasing her oxygen. She does seem better, little to no secretions from subglottic or et tube. Have increased oxygen back from 50 to 60 as she looks more comfortable at 60.

## 2018-05-20 NOTE — Progress Notes (Addendum)
Patient appears to be stacking her breathing. The rate is set at 18 and she is breathing 24 over. If rate is increased she increases her respiratory rate. She also has a vt of 360 machine setting. She is inhaling 600 and exhaling 600. She at time is in inverse ratio. Her wave forms all are not normal. Nothing seems to correct this. At times her peep drops to negative 8. All this seems to indicate air hunger. Though nothing so far has corrected the problem including increasing VT up to 560. Ventilator has been changed out  to be sure this isn't a machine problem. New machine exhibits same problem. All this is puzzling. Sedation does not appear to correct problem. Her peak pressure is 8 mean pressure is 8 , plateau appears higher 14 ???  It is questionable how much will are actually ventilating her.  Saturation is still 95 but patient appears once again air hungry. This has been going on from beginning of shift so I suspect day time also. Will continue to monitor..Marland Kitchen

## 2018-05-20 NOTE — Progress Notes (Signed)
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 She had gone to her PCP a few days prior for a week of fevers, chills, increasing cough, and diagnosed with pneumonia, started on doxycycline and nebulized breathing treatments at home but has had no improvement. Over the past 24 hours she's grown increasingly weak, getting up out of bed rarely, much more short of breath when she attempts this and constantly feels like she's about to pass out. On initial evaluation she was afebrile, but was 100.7Fat home prior to taking tylenol. Tachycardic with soft BP, hypoxic to 88% on room air, improved with 2L O2, tachypneic. WBC 11.3k, flu negative, lactic acid elevated. Levaquin and IV fluids given after blood cultures drawn.She continued to be hypoxic and developed wheezing for which steroids and breathing treatments were provided. Respiratory effort increased and hypoxia worsened prompting pulmonology consultation and transfer to SDU/ICU on 11/18.BiPAP started with improvement in oxygenation. The patient did not show any improvement on BiPAP. She was intubated on 05/17/18.  Assessment/Plan: Acute hypoxic respiratory failure: -Due to multifocal pneumonia, bronchospasmandan element of interstitial edema. - Continue broad antimicrobial coverage, -Stopped vancomycin as she failed an adequate trial of doxycycline, MRSA negative. -continue solumedrol to 60 IV q 6 - Intubated 05/17/18 -received lasix 11/18, 11/19, 11/21 -set up CVP--now 7-10 -repeat CXR--personally reviewed--slight improvement RLL, LLL opacities; increased interstitial markings stable  Sepsis due to multilobar  pneumonia: -Flu negative. -RVP = rhino/entervirus - Continuebroad antibiotics. -PCT declining 0.49>>>0.34 -tracheal aspirate for culture--yeast-->unclear clinicaly significance -no fever x 24 hours -repeat blood culture neg -re[eat UA without pyuria -d/c levofloxacin -continue cefepime  COPD Exacerbation -pt has at least 20 pack year history of tobacco -continuesolumedrol -conitinuepulmicort -continue bronchodilators  FEN -placedOG -continueenteral feeding--tolerating -check mag --2.6 -check phos--4.8  HTN: - Continue to hold lisinopril  Tachycardia -question intermittent atrial tachycardia/SVT -Troponin negative -EKG--sinus--nonspecific T wave changes -change albuterol to xopenex -started dilitazem 30 mg po q6 (on verapamil at home) -05/15/18 Echo--EF 55-60%, no WMA, normal RV -personally reviewed EKG--sinus/atrial tachycardia, no STT changes  Hypothyroidism:  - Continue home variable synthroid dosing. -change synthroid to IV  Thrombocytosis: -Reactive, monitor. -trending down  Leukocytosis: -Difficult to distinguish between infection vs. steroid-induced.  - Continue monitoring. -pt remains afebrileand hemodynamically stable -repeat procalcitonin trending down  Stage III CKD: -Creatinine at baseline on admission.Had lasix, vancomycin, and contrast but seems to be stable. -Continues to be stable, will monitor with redosing lasix. -baseline creatinine 0.8-1.1 -serum creatinine up due to furosemide -maintain even/negative fluid balance  Carotid stenosis: -Continue QOD ASA.  Hyperlipidemia: -Continue statin    Disposition Plan:remain inICU Family Communication: Family at bedside updated11/23  Consultants:pulmonary  Code Status: FULL   DVT Prophylaxis:Tyrone Lovenox   Procedures: As Listed in Progress Note Above  Antibiotics: vanco 11/18 levoflox 11/14>>>11/22 Cefepime 11/18>>>   The patient  is critically ill with multiple organ systems failure and requires high complexity decision making for assessment and support, frequent evaluation and titration of therapies, application of advanced monitoring technologies and extensive interpretation of multiple databases.  Critical care time -35 mins.      Subjective:   Objective: Vitals:   05/20/18 0630 05/20/18 0700 05/20/18 0730 05/20/18 0916  BP: Marland Kitchen)  145/71 129/64    Pulse: (!) 114 (!) 112 (!) 106   Resp: (!) '21 19 19   '$ Temp:   99.2 F (37.3 C)   TempSrc:   Axillary   SpO2: 93% 96% 96% 96%  Weight:      Height:        Intake/Output Summary (Last 24 hours) at 05/20/2018 1057 Last data filed at 05/20/2018 0714 Gross per 24 hour  Intake 807.31 ml  Output 975 ml  Net -167.69 ml   Weight change: 0.5 kg Exam:   General:  Pt is sedated on ventilator  HEENT: No icterus, No thrush, No neck mass, [place]/AT  Cardiovascular: RRR, S1/S2, no rubs, no gallops  Respiratory: bibasilar rales.  Diminished breath sounds at bases  Abdomen: Soft/+BS, non tender, non distended, no guarding  Extremities: No edema, No lymphangitis, No petechiae, No rashes, no synovitis   Data Reviewed: I have personally reviewed following labs and imaging studies Basic Metabolic Panel: Recent Labs  Lab 05/16/18 0607 05/17/18 0413 05/17/18 1026 05/18/18 0356 05/19/18 0533 05/20/18 0530  NA 137 140  --  141 143 145  K 4.1 3.6  --  4.3 4.8 4.8  CL 108 109  --  111 112* 115*  CO2 21* 22  --  '23 24 25  '$ GLUCOSE 141* 142*  --  195* 182* 187*  BUN 14 21  --  32* 56* 70*  CREATININE 0.97 1.02*  --  1.21* 1.43* 1.39*  CALCIUM 8.2* 8.3*  --  8.6* 8.6* 8.4*  MG  --   --  2.1  --  2.6*  --   PHOS  --   --  4.0 3.7 4.8*  --    Liver Function Tests: Recent Labs  Lab 05/15/18 0801 05/18/18 0356 05/20/18 0530  AST '30 21 17  '$ ALT '16 22 16  '$ ALKPHOS 73 87 71  BILITOT 0.4 0.5 0.5  PROT 6.2* 5.9* 5.7*  ALBUMIN 2.0* 1.9* 1.8*   No results for  input(s): LIPASE, AMYLASE in the last 168 hours. No results for input(s): AMMONIA in the last 168 hours. Coagulation Profile: No results for input(s): INR, PROTIME in the last 168 hours. CBC: Recent Labs  Lab 05/16/18 0607 05/17/18 0413 05/18/18 0356 05/19/18 0533 05/20/18 0530  WBC 20.1* 19.3* 20.5* 24.2* 22.4*  NEUTROABS  --   --   --  21.6*  --   HGB 9.2* 8.8* 8.8* 9.4* 8.4*  HCT 28.8* 27.7* 28.4* 31.0* 28.0*  MCV 89.4 87.7 89.9 93.9 94.0  PLT 501* 427* 332 235 162   Cardiac Enzymes: No results for input(s): CKTOTAL, CKMB, CKMBINDEX, TROPONINI in the last 168 hours. BNP: Invalid input(s): POCBNP CBG: Recent Labs  Lab 05/19/18 1500 05/19/18 2039 05/19/18 2354 05/20/18 0355 05/20/18 0728  GLUCAP 160* 185* 188* 177* 178*   HbA1C: No results for input(s): HGBA1C in the last 72 hours. Urine analysis:    Component Value Date/Time   COLORURINE YELLOW 05/19/2018 1145   APPEARANCEUR HAZY (A) 05/19/2018 1145   LABSPEC 1.020 05/19/2018 1145   PHURINE 6.0 05/19/2018 1145   GLUCOSEU NEGATIVE 05/19/2018 1145   HGBUR LARGE (A) 05/19/2018 1145   BILIRUBINUR NEGATIVE 05/19/2018 1145   KETONESUR NEGATIVE 05/19/2018 1145   PROTEINUR 30 (A) 05/19/2018 1145   UROBILINOGEN 0.2 01/07/2010 1316   NITRITE NEGATIVE 05/19/2018 1145   LEUKOCYTESUR NEGATIVE 05/19/2018 1145   Sepsis Labs: '@LABRCNTIP'$ (procalcitonin:4,lacticidven:4) ) Recent Results (from the past 240 hour(s))  Urine culture     Status:  None   Collection Time: 05/14/2018 11:52 AM  Result Value Ref Range Status   Specimen Description   Final    URINE, RANDOM Performed at [facility], [address] [place], [place] [address]    Special Requests   Final    NONE Performed at [facility], [address] [place], [place] [address]    Culture   Final    NO GROWTH Performed at [facility], [place] [address], [place], [place] [address]    Report Status 05/12/2018 FINAL  Final  Culture, blood (routine x 2)      Status: None   Collection Time: 05/13/2018 12:38 PM  Result Value Ref Range Status   Specimen Description BLOOD RIGHT ARM  Final   Special Requests   Final    BOTTLES DRAWN AEROBIC AND ANAEROBIC Blood Culture adequate volume   Culture   Final    NO GROWTH 5 DAYS Performed at [facility], [address], [place], [place] [address]    Report Status 05/16/2018 FINAL  Final  Culture, blood (routine x 2)     Status: None   Collection Time: 05/07/2018 12:38 PM  Result Value Ref Range Status   Specimen Description BLOOD RIGHT HAND  Final   Special Requests   Final    BOTTLES DRAWN AEROBIC AND ANAEROBIC Blood Culture adequate volume   Culture   Final    NO GROWTH 5 DAYS Performed at [facility], [address], [place], [place] [address]    Report Status 05/16/2018 FINAL  Final  MRSA PCR Screening     Status: None   Collection Time: 05/15/18  7:17 AM  Result Value Ref Range Status   MRSA by PCR NEGATIVE NEGATIVE Final    Comment:        The GeneXpert MRSA Assay (FDA approved for NASAL specimens only), is one component of a comprehensive MRSA colonization surveillance program. It is not intended to diagnose MRSA infection nor to guide or monitor treatment for MRSA infections. Performed at [facility], [address], [place], [place] [address]   Respiratory Panel by PCR     Status: Abnormal   Collection Time: 05/17/18  5:45 PM  Result Value Ref Range Status   Adenovirus NOT DETECTED NOT DETECTED Final   Coronavirus 229E NOT DETECTED NOT DETECTED Final   Coronavirus HKU1 NOT DETECTED NOT DETECTED Final   Coronavirus NL63 NOT DETECTED NOT DETECTED Final   Coronavirus OC43 NOT DETECTED NOT DETECTED Final   Metapneumovirus NOT DETECTED NOT DETECTED Final   Rhinovirus / Enterovirus DETECTED (A) NOT DETECTED Final   Influenza A NOT DETECTED NOT DETECTED Final   Influenza B NOT DETECTED NOT DETECTED Final   Parainfluenza Virus 1 NOT DETECTED NOT DETECTED Final   Parainfluenza Virus 2  NOT DETECTED NOT DETECTED Final   Parainfluenza Virus 3 NOT DETECTED NOT DETECTED Final   Parainfluenza Virus 4 NOT DETECTED NOT DETECTED Final   Respiratory Syncytial Virus NOT DETECTED NOT DETECTED Final   Bordetella pertussis NOT DETECTED NOT DETECTED Final   Chlamydophila pneumoniae NOT DETECTED NOT DETECTED Final   Mycoplasma pneumoniae NOT DETECTED NOT DETECTED Final    Comment: Performed at [facility], [place] [address], [place], [place] [address]  Culture, respiratory (non-expectorated)     Status: None (Preliminary result)   Collection Time: 05/17/18  5:45 PM  Result Value Ref Range Status   Specimen Description   Final    TRACHEAL ASPIRATE Performed at [facility], [address], [place], [place] [address]    Special Requests   Final    NONE Performed at [facility], [address], [place], [place] [address]    Gram Stain   Final    RARE WBC PRESENT, PREDOMINANTLY PMN NO ORGANISMS SEEN    Culture   Final    FEW YEAST IDENTIFICATION TO FOLLOW Performed at [facility], [place] [address] [place], [place] [address]    Report Status PENDING  Incomplete  Culture, blood (Routine X 2) w Reflex to ID Panel     Status: None (Preliminary result)   Collection Time: 05/19/18 11:06 AM  Result Value Ref Range Status   Specimen Description BLOOD PICC LINE DRAWN BY RN  Final   Special Requests   Final    BOTTLES DRAWN AEROBIC AND ANAEROBIC Blood Culture adequate volume   Culture   Final    NO GROWTH < 12 HOURS Performed at [facility], [address] [place], [place] [address]    Report Status PENDING  Incomplete  Culture, blood (Routine X 2) w Reflex to ID Panel     Status: None (Preliminary result)   Collection Time: 05/19/18 11:09 AM  Result Value Ref Range Status   Specimen Description BLOOD LEFT HAND DRAWN BY RN  Final   Special Requests   Final    BOTTLES DRAWN AEROBIC AND ANAEROBIC Blood Culture results may not be optimal due to an excessive volume of blood  received in culture bottles   Culture   Final    NO GROWTH < 12 HOURS Performed at [facility], [address], [place], [place] [address]    Report Status PENDING  Incomplete     Scheduled Meds: . acetylcysteine  3 mL Nebulization Q6H  . aspirin EC  81 mg Oral Q48H  . budesonide (PULMICORT) nebulizer solution  0.5 mg Nebulization BID  . chlorhexidine gluconate (MEDLINE KIT)  15 mL Mouth Rinse BID  . Chlorhexidine Gluconate Cloth  6 each Topical Daily  . diltiazem  30 mg Oral Q6H  . enoxaparin (LOVENOX) injection  40 mg Subcutaneous Q24H  . feeding supplement (PRO-STAT SUGAR FREE 64)  30 mL Per Tube Daily  . feeding supplement (VITAL AF 1.2 CAL)  1,000 mL Per Tube Q24H  . fentaNYL (SUBLIMAZE) injection  50 mcg Intravenous Once  . ipratropium  0.5 mg Nebulization Q6H  . levalbuterol  1.25 mg Nebulization Q6H  . levothyroxine  37.5 mcg Intravenous Daily  . mouth rinse  15 mL Mouth Rinse 10 times per day  . methylPREDNISolone (SOLU-MEDROL) injection  60 mg Intravenous Q6H  . pantoprazole (PROTONIX) IV  40 mg Intravenous Q24H  . sodium chloride flush  10-40 mL Intracatheter Q12H  . sodium chloride flush  3 mL Intravenous Q12H   Continuous Infusions: . sodium chloride 10 mL/hr at 05/20/18 0714  . ceFEPime (MAXIPIME) IV Stopped (05/19/18 2133)  . fentaNYL infusion INTRAVENOUS 100 mcg/hr (05/20/18 0846)    Procedures/Studies: Dg Chest 2 View  Result Date: 05/13/2018 CLINICAL DATA:  Recent pneumonia with persistent cough, fever, and shortness of breath. EXAM: CHEST - 2 VIEW COMPARISON:  May 08, 2018 FINDINGS: Extensive airspace disease is noted throughout much of the right upper and lower lobe regions. There is also patchy infiltrate in the left lower lobe. The degree of consolidation on the right is stable. There is slight the increased consolidation in the left base. Heart size and pulmonary vascularity are normal. No adenopathy. No  bone lesions. IMPRESSION: Multifocal pneumonia.  Areas of airspace opacity in the right upper and lower lobes appears essentially stable compared to 3 days prior. Increase in infiltrate left base. Stable cardiac silhouette. No adenopathy evident. Electronically Signed   By: Lowella Grip III M.D.   On: 04/28/2018 12:57   Dg Chest 2 View  Result Date: 05/08/2018 CLINICAL DATA:  Productive cough and fever for a couple weeks, former smoker, history hypertension, cardiomyopathy EXAM: CHEST - 2 VIEW COMPARISON:  None FINDINGS: Normal heart size, mediastinal contours, and pulmonary vascularity. Scattered opacities throughout the RIGHT lung likely representing pneumonia. However these are somewhat patchy/nodular in appearance and require follow-up until resolution to exclude underlying abnormalities including tumor. Minimal atelectasis versus consolidation in LEFT lower lobe. Remaining LEFT lung clear. Elevation of RIGHT diaphragm. No pleural effusion or pneumothorax. IMPRESSION: Predominately RIGHT-sided pulmonary infiltrates consistent with pneumonia. Followup PA and lateral chest X-ray is recommended in 3-4 weeks following trial of antibiotic therapy to ensure resolution and exclude underlying malignancy. These results will be called to the ordering clinician or representative by the Radiologist Assistant, and communication documented in the PACS or zVision Dashboard. Electronically Signed   By: Lavonia Dana M.D.   On: 05/08/2018 11:45   Ct Angio Chest Pe W Or Wo Contrast  Result Date: 05/14/2018 CLINICAL DATA:  Shortness of breath worsening today. Clinical concern for pulmonary embolus. Positive D-dimer. EXAM: CT ANGIOGRAPHY CHEST WITH CONTRAST TECHNIQUE: Multidetector CT imaging of the chest was performed using the standard protocol during bolus administration of intravenous contrast. Multiplanar CT image reconstructions and MIPs were obtained to evaluate the vascular anatomy. CONTRAST:  58m ISOVUE-370 IOPAMIDOL (ISOVUE-370) INJECTION 76% COMPARISON:   None FINDINGS: Cardiovascular: The heart size is normal. No substantial pericardial effusion. No thoracic aortic aneurysm. No filling defect in the opacified pulmonary arteries to suggest the presence of an acute pulmonary embolus. Mediastinum/Nodes: 14 mm short axis subcarinal lymph node is associated with mild bilateral hilar lymphadenopathy. The esophagus has normal imaging features. There is no axillary lymphadenopathy. Lungs/Pleura: The central tracheobronchial airways are patent. Asymmetric patchy airspace disease, right greater than left is associated with interlobular septal thickening. Confluent airspace disease noted in the right lower lobe posterior left lower lobe. Small to moderate right pleural effusion evident with small left pleural effusion. Upper Abdomen: The liver shows diffusely decreased attenuation suggesting steatosis. Musculoskeletal: No worrisome lytic or sclerotic osseous abnormality. Review of the MIP images confirms the above findings. IMPRESSION: 1. No CT evidence for acute pulmonary embolus. 2. Bilateral patchy, right greater than left airspace disease compatible with multifocal pneumonia. 3. Mild mediastinal and bilateral hilar lymphadenopathy. 4. Small to moderate bilateral pleural effusions.  The Electronically Signed   By: EMisty StanleyM.D.   On: 05/14/2018 17:35   Dg Chest Port 1 View  Result Date: 05/20/2018 CLINICAL DATA:  Hypoxia.  Acute respiratory failure EXAM: PORTABLE CHEST 1 VIEW COMPARISON:  Chest radiograph 05/19/2018 FINDINGS: ET tube terminates in the mid trachea. Right upper extremity PICC line tip projects over the superior vena cava. Enteric tube courses inferior to the diaphragm. Monitoring leads overlie the patient. Stable cardiac and mediastinal contours. Similar-appearing bilateral interstitial pulmonary opacities. No pleural effusion or pneumothorax. IMPRESSION: Support apparatus as above. Similar-appearing interstitial opacities favored to represent  edema. Electronically Signed   By: DLovey NewcomerM.D.   On: 05/20/2018 08:58   Dg Chest Port 1 View  Result Date: 05/19/2018 CLINICAL DATA:  Nasogastric tube placement. EXAM: PORTABLE CHEST 1 VIEW COMPARISON:  Radiograph of May 18, 2018. FINDINGS: Stable cardiomediastinal silhouette. Endotracheal and nasogastric tubes are unchanged in position. Right-sided PICC line is unchanged in position. No pneumothorax is noted. Mildly improved bilateral lung opacities are noted most consistent with improving edema. Small pleural effusions may be present. Bony thorax is unremarkable. IMPRESSION: Stable support apparatus. Probable mildly improved bilateral pulmonary edema. Electronically Signed   By: Marijo Conception, M.D.   On: 05/19/2018 10:23   Dg Chest Port 1 View  Result Date: 05/18/2018 CLINICAL DATA:  Recent pneumonia EXAM: PORTABLE CHEST 1 VIEW COMPARISON:  Yesterday FINDINGS: Endotracheal tube tip at the clavicular heads. An orogastric tube reaches the stomach at least. Right upper extremity PICC in the interim with tip at the SVC. Extensive interstitial and airspace opacity with a patchy appearance. No visible effusion or pneumothorax. Normal heart size. Artifact from EKG leads. IMPRESSION: 1. Unremarkable hardware positioning. 2. Multifocal pneumonia with possible edema. Electronically Signed   By: Monte Fantasia M.D.   On: 05/18/2018 07:18   Portable Chest X-ray  Result Date: 05/17/2018 CLINICAL DATA:  Evaluate endotracheal tube placement EXAM: PORTABLE CHEST 1 VIEW COMPARISON:  Portable chest x-ray of 05/17/2017 FINDINGS: Endotracheal tube has now been placed with the tip approximately 2.7 cm above the carina. There is little change in patchy lung opacities greatest in the right mid and upper lung which appears most typical of pneumonia. However as noted on the prior dictation, superimposed edema cannot be excluded. Mild cardiomegaly is stable. IMPRESSION: 1. Tip of endotracheal tube 2.7 cm above  the carina. 2. Little change in lung opacities as noted previously suspicious for pneumonia and possibly superimposed edema. Electronically Signed   By: Ivar Drape M.D.   On: 05/17/2018 10:12   Dg Chest Port 1 View  Result Date: 05/17/2018 CLINICAL DATA:  Respiratory failure EXAM: PORTABLE CHEST 1 VIEW COMPARISON:  05/16/2018 FINDINGS: Right upper lobe and left lower lobe opacities, suspicious for pneumonia. Superimposed interstitial opacities, suspicious for interstitial edema. No definite pleural effusions. The heart is normal in size. No pneumothorax. IMPRESSION: Multifocal opacities, right upper lobe predominant, suspicious for pneumonia. Superimposed interstitial edema is also suspected. Overall appearance is similar to the prior. Electronically Signed   By: Julian Hy M.D.   On: 05/17/2018 09:12   Dg Chest Port 1 View  Result Date: 05/16/2018 CLINICAL DATA:  Respiratory failure EXAM: PORTABLE CHEST 1 VIEW COMPARISON:  CTA chest dated 05/14/2018 FINDINGS: Multifocal patchy opacities, right upper lobe predominant, suspicious for pneumonia. Superimposed mild interstitial edema is possible. No definite pleural effusion. No pneumothorax. The heart is normal in size. IMPRESSION: Multifocal patchy opacities, right upper lobe predominant, suspicious for pneumonia. Superimposed mild interstitial edema is possible. These findings are better evaluated on recent CT. Electronically Signed   By: Julian Hy M.D.   On: 05/16/2018 09:32   [place] Ekg Site Rite  Result Date: 05/17/2018 If Site Rite image not attached, placement could not be confirmed due to current cardiac rhythm.   Orson Eva, DO  [facility] Pager [phone]  If 7PM-7AM, please contact night-coverage [email or web address] Password [email or web address] 05/20/2018, 10:57 AM   LOS: 8 days

## 2018-05-21 ENCOUNTER — Inpatient Hospital Stay (HOSPITAL_COMMUNITY): Payer: PPO

## 2018-05-21 DIAGNOSIS — L8990 Pressure ulcer of unspecified site, unspecified stage: Secondary | ICD-10-CM

## 2018-05-21 LAB — CBC WITH DIFFERENTIAL/PLATELET
ABS IMMATURE GRANULOCYTES: 1.21 10*3/uL — AB (ref 0.00–0.07)
BASOS ABS: 0 10*3/uL (ref 0.0–0.1)
Basophils Relative: 0 %
Eosinophils Absolute: 0 10*3/uL (ref 0.0–0.5)
Eosinophils Relative: 0 %
HCT: 26.5 % — ABNORMAL LOW (ref 36.0–46.0)
Hemoglobin: 8.1 g/dL — ABNORMAL LOW (ref 12.0–15.0)
IMMATURE GRANULOCYTES: 6 %
Lymphocytes Relative: 7 %
Lymphs Abs: 1.5 10*3/uL (ref 0.7–4.0)
MCH: 28.5 pg (ref 26.0–34.0)
MCHC: 30.6 g/dL (ref 30.0–36.0)
MCV: 93.3 fL (ref 80.0–100.0)
Monocytes Absolute: 1 10*3/uL (ref 0.1–1.0)
Monocytes Relative: 5 %
NEUTROS ABS: 17.4 10*3/uL — AB (ref 1.7–7.7)
NEUTROS PCT: 82 %
NRBC: 0.4 % — AB (ref 0.0–0.2)
PLATELETS: 132 10*3/uL — AB (ref 150–400)
RBC: 2.84 MIL/uL — AB (ref 3.87–5.11)
RDW: 14.7 % (ref 11.5–15.5)
WBC: 21.2 10*3/uL — AB (ref 4.0–10.5)

## 2018-05-21 LAB — BASIC METABOLIC PANEL
Anion gap: 6 (ref 5–15)
BUN: 88 mg/dL — ABNORMAL HIGH (ref 8–23)
CO2: 25 mmol/L (ref 22–32)
Calcium: 8.6 mg/dL — ABNORMAL LOW (ref 8.9–10.3)
Chloride: 117 mmol/L — ABNORMAL HIGH (ref 98–111)
Creatinine, Ser: 1.53 mg/dL — ABNORMAL HIGH (ref 0.44–1.00)
GFR calc non Af Amer: 34 mL/min — ABNORMAL LOW (ref >60)
GFR, EST AFRICAN AMERICAN: 39 mL/min — AB (ref >60)
Glucose, Bld: 164 mg/dL — ABNORMAL HIGH (ref 70–99)
POTASSIUM: 5.3 mmol/L — AB (ref 3.5–5.1)
SODIUM: 148 mmol/L — AB (ref 135–145)

## 2018-05-21 LAB — PROCALCITONIN: Procalcitonin: 10.81 ng/mL

## 2018-05-21 LAB — BLOOD GAS, ARTERIAL
Acid-Base Excess: 0.3 mmol/L (ref 0.0–2.0)
Bicarbonate: 24.5 mmol/L (ref 20.0–28.0)
Drawn by: 105551
FIO2: 55
Mechanical Rate: 22
O2 SAT: 96.5 %
PEEP: 8 cmH2O
RATE: 22 resp/min
VT: 600 mL
pCO2 arterial: 43.5 mmHg (ref 32.0–48.0)
pH, Arterial: 7.375 (ref 7.350–7.450)
pO2, Arterial: 89.8 mmHg (ref 83.0–108.0)

## 2018-05-21 LAB — GLUCOSE, CAPILLARY
GLUCOSE-CAPILLARY: 167 mg/dL — AB (ref 70–99)
GLUCOSE-CAPILLARY: 202 mg/dL — AB (ref 70–99)
Glucose-Capillary: 180 mg/dL — ABNORMAL HIGH (ref 70–99)
Glucose-Capillary: 184 mg/dL — ABNORMAL HIGH (ref 70–99)
Glucose-Capillary: 184 mg/dL — ABNORMAL HIGH (ref 70–99)

## 2018-05-21 LAB — URINE CULTURE: Culture: NO GROWTH

## 2018-05-21 MED ORDER — SODIUM CHLORIDE 0.45 % IV SOLN
INTRAVENOUS | Status: DC
  Administered 2018-05-21 (×2): via INTRAVENOUS

## 2018-05-21 MED ORDER — METOPROLOL TARTRATE 5 MG/5ML IV SOLN
2.5000 mg | Freq: Once | INTRAVENOUS | Status: AC
  Administered 2018-05-21: 2.5 mg via INTRAVENOUS
  Filled 2018-05-21: qty 5

## 2018-05-21 MED ORDER — DOCUSATE SODIUM 50 MG/5ML PO LIQD
100.0000 mg | Freq: Two times a day (BID) | ORAL | Status: DC
  Administered 2018-05-21 – 2018-05-24 (×6): 100 mg
  Filled 2018-05-21 (×10): qty 10

## 2018-05-21 MED ORDER — BISACODYL 10 MG RE SUPP
10.0000 mg | Freq: Every day | RECTAL | Status: DC | PRN
  Administered 2018-05-21: 10 mg via RECTAL
  Filled 2018-05-21: qty 1

## 2018-05-21 MED ORDER — DOCUSATE SODIUM 50 MG/5ML PO LIQD
100.0000 mg | Freq: Two times a day (BID) | ORAL | Status: DC
  Filled 2018-05-21 (×3): qty 10

## 2018-05-21 MED ORDER — DILTIAZEM HCL 60 MG PO TABS
60.0000 mg | ORAL_TABLET | Freq: Four times a day (QID) | ORAL | Status: DC
  Administered 2018-05-21 – 2018-05-24 (×13): 60 mg via ORAL
  Filled 2018-05-21 (×13): qty 1

## 2018-05-21 NOTE — Progress Notes (Signed)
Have decreased oxygen down to 55. Have also DC mucomyst, Still not obtain any secretions with etsx

## 2018-05-21 NOTE — Progress Notes (Signed)
Pharmacy Antibiotic Note  [name] is a 67 y.o. female admitted on 05/03/2018 with pneumonia.  Pharmacy has been consulted for cefepime dosing. Intubated 11/20, chest x-ray with mulitfocal PNA.  PCT reassuring at 0.19 > 0.16 > 0.13 and undetectable on 11/18.    Plan: Continue Cefepime 2gm IV q24h Monitor labs, c/s, and patient improvement.   Height: 5' (152.4 cm) Weight: 167 lb 8.8 oz (76 kg) IBW/kg (Calculated) : 45.5  Temp (24hrs), Avg:100 F (37.8 C), Min:99.4 F (37.4 C), Max:100.6 F (38.1 C)  Recent Labs  Lab 05/17/18 0413 05/18/18 0356 05/19/18 0533 05/20/18 0530 05/21/18 0419  WBC 19.3* 20.5* 24.2* 22.4* 21.2*  CREATININE 1.02* 1.21* 1.43* 1.39* 1.53*    Estimated Creatinine Clearance: 31.6 mL/min (A) (by C-G formula based on SCr of 1.53 mg/dL (H)).    Allergies  Allergen Reactions  . Neosporin [Neomycin-Bacitracin Zn-Polymyx] Rash    Antimicrobials this admission: 11/17 cefepime >>  11/17 vancomycin >> 11/19 11/18 Levaquin >> 11/22  Microbiology results: 11/1 BCx: nx5 day (final) 11/17 UCx: no growth (final) 11/17 MRSA PCR: negative 11/20 Resp: rhinovirus/enterovirus  Thank you for allowing pharmacy to be a part of this patient's care.  Judeth CornfieldSteven Stephane Junkins, PharmD Clinical Pharmacist 05/21/2018 10:07 AM

## 2018-05-21 NOTE — Progress Notes (Signed)
Saturation is 96 at 60 FiO2. Patients rate 22 - total 24-26, still has extra desynchronous breaths not as many. Sedation better.

## 2018-05-21 NOTE — Progress Notes (Signed)
Down to 50 FiO2

## 2018-05-21 NOTE — Progress Notes (Signed)
Subjective: She remains intubated and on the ventilator but is down to 50% oxygen.  No new complaints noted but nursing staff says that she has not had a bowel movement in several days.  Objective: Vital signs in last 24 hours: Temp:  [99.4 F (37.4 C)-100.6 F (38.1 C)] 100.6 F (38.1 C) (11/24 0721) Pulse Rate:  [94-129] 94 (11/24 0900) Resp:  [16-29] 19 (11/24 0900) BP: (105-150)/(45-81) 123/53 (11/24 0900) SpO2:  [91 %-98 %] 95 % (11/24 0721) FiO2 (%):  [50 %-60 %] 50 % (11/24 0500) Weight:  [76 kg] 76 kg (11/24 0423) Weight change: 0.5 kg Last BM Date: 05/14/18  Intake/Output from previous day: 11/23 0701 - 11/24 0700 In: 2284.2 [I.V.:696.2; TF/TD:3220; IV Piggyback:200] Out: 2542 [Urine:1550; Emesis/NG output:25]  PHYSICAL EXAM General appearance: She is intubated sedated on mechanical ventilation Resp: Still some rales and rhonchi but she sounds a little bit better in general Cardio: regular rate and rhythm, S1, S2 normal, no murmur, click, rub or gallop GI: soft, non-tender; bowel sounds normal; no masses,  no organomegaly Extremities: extremities normal, atraumatic, no cyanosis or edema  Lab Results:  Results for orders placed or performed during the hospital encounter of 05/10/2018 (from the past 48 hour(s))  Culture, blood (Routine X 2) w Reflex to ID Panel     Status: None (Preliminary result)   Collection Time: 05/19/18 11:06 AM  Result Value Ref Range   Specimen Description BLOOD PICC LINE DRAWN BY RN    Special Requests      BOTTLES DRAWN AEROBIC AND ANAEROBIC Blood Culture adequate volume   Culture      NO GROWTH 2 DAYS Performed at [facility], [address], [place], [place] [address]    Report Status PENDING   Procalcitonin - Baseline     Status: None   Collection Time: 05/19/18 11:07 AM  Result Value Ref Range   Procalcitonin 0.49 ng/mL    Comment:        Interpretation: PCT (Procalcitonin) <= 0.5 ng/mL: Systemic infection (sepsis) is not  likely. Local bacterial infection is possible. (NOTE)       Sepsis PCT Algorithm           Lower Respiratory Tract                                      Infection PCT Algorithm    ----------------------------     ----------------------------         PCT < 0.25 ng/mL                PCT < 0.10 ng/mL         Strongly encourage             Strongly discourage   discontinuation of antibiotics    initiation of antibiotics    ----------------------------     -----------------------------       PCT 0.25 - 0.50 ng/mL            PCT 0.10 - 0.25 ng/mL               OR       >80% decrease in PCT            Discourage initiation of  antibiotics      Encourage discontinuation           of antibiotics    ----------------------------     -----------------------------         PCT >= 0.50 ng/mL              PCT 0.26 - 0.50 ng/mL               AND        <80% decrease in PCT             Encourage initiation of                                             antibiotics       Encourage continuation           of antibiotics    ----------------------------     -----------------------------        PCT >= 0.50 ng/mL                  PCT > 0.50 ng/mL               AND         increase in PCT                  Strongly encourage                                      initiation of antibiotics    Strongly encourage escalation           of antibiotics                                     -----------------------------                                           PCT <= 0.25 ng/mL                                                 OR                                        > 80% decrease in PCT                                     Discontinue / Do not initiate                                             antibiotics Performed at [facility], [address], [place], [place] [address]   Culture, blood (Routine X 2) w Reflex to ID Panel     Status: None (  Preliminary result)    Collection Time: 05/19/18 11:09 AM  Result Value Ref Range   Specimen Description BLOOD LEFT HAND DRAWN BY RN    Special Requests      BOTTLES DRAWN AEROBIC AND ANAEROBIC Blood Culture results may not be optimal due to an excessive volume of blood received in culture bottles   Culture      NO GROWTH 2 DAYS Performed at [facility], [address] [place], [place] [address]    Report Status PENDING   Glucose, capillary     Status: Abnormal   Collection Time: 05/19/18 11:43 AM  Result Value Ref Range   Glucose-Capillary 160 (H) 70 - 99 mg/dL  Urinalysis, Complete w Microscopic     Status: Abnormal   Collection Time: 05/19/18 11:45 AM  Result Value Ref Range   Color, Urine YELLOW YELLOW   APPearance HAZY (A) CLEAR   Specific Gravity, Urine 1.020 1.005 - 1.030   pH 6.0 5.0 - 8.0   Glucose, UA NEGATIVE NEGATIVE mg/dL   Hgb urine dipstick LARGE (A) NEGATIVE   Bilirubin Urine NEGATIVE NEGATIVE   Ketones, ur NEGATIVE NEGATIVE mg/dL   Protein, ur 30 (A) NEGATIVE mg/dL   Nitrite NEGATIVE NEGATIVE   Leukocytes, UA NEGATIVE NEGATIVE   Squamous Epithelial / LPF 0-5 0 - 5   WBC, UA 0-5 0 - 5 WBC/hpf   RBC / HPF 6-10 0 - 5 RBC/hpf   Bacteria, UA FEW (A) NONE SEEN   Granular Casts, UA PRESENT     Comment: Performed at [facility], [address] [place], [place] [address]  Culture, Urine     Status: None   Collection Time: 05/19/18 11:45 AM  Result Value Ref Range   Specimen Description      URINE, CATHETERIZED Performed at [facility], [address], [place], [place] [address]    Special Requests      NONE Performed at [facility], [address], [place], [place] [address]    Culture      NO GROWTH Performed at [facility], [place] [address], [place], [place] [address]    Report Status 05/21/2018 FINAL   Glucose, capillary     Status: Abnormal   Collection Time: 05/19/18  3:00 PM  Result Value Ref Range   Glucose-Capillary 160 (H) 70 - 99 mg/dL  Glucose, capillary      Status: Abnormal   Collection Time: 05/19/18  8:39 PM  Result Value Ref Range   Glucose-Capillary 185 (H) 70 - 99 mg/dL   Comment 1 Notify RN    Comment 2 Document in Chart   Glucose, capillary     Status: Abnormal   Collection Time: 05/19/18 11:54 PM  Result Value Ref Range   Glucose-Capillary 188 (H) 70 - 99 mg/dL   Comment 1 Notify RN    Comment 2 Document in Chart   Glucose, capillary     Status: Abnormal   Collection Time: 05/20/18  3:55 AM  Result Value Ref Range   Glucose-Capillary 177 (H) 70 - 99 mg/dL   Comment 1 Notify RN    Comment 2 Document in Chart   Blood gas, arterial     Status: Abnormal   Collection Time: 05/20/18  5:00 AM  Result Value Ref Range   FIO2 70.00    Delivery systems VENTILATOR    Mode PRESSURE REGULATED VOLUME CONTROL    VT 600 mL   LHR 22 resp/min   Peep/cpap 8.0 cm H20  pH, Arterial 7.341 (L) 7.350 - 7.450   pCO2 arterial 47.1 32.0 - 48.0 mmHg   pO2, Arterial 93.0 83.0 - 108.0 mmHg   Bicarbonate 23.9 20.0 - 28.0 mmol/L   Acid-base deficit 0.2 0.0 - 2.0 mmol/L   O2 Saturation 96.7 %   Collection site RADIAL    Drawn by [id number]    Sample type ARTERIAL    Allens test (pass/fail) PASS PASS   Mechanical Rate 22     Comment: Performed at [facility], [address] [place], [place] [address]  Procalcitonin     Status: None   Collection Time: 05/20/18  5:30 AM  Result Value Ref Range   Procalcitonin 0.34 ng/mL    Comment:        Interpretation: PCT (Procalcitonin) <= 0.5 ng/mL: Systemic infection (sepsis) is not likely. Local bacterial infection is possible. (NOTE)       Sepsis PCT Algorithm           Lower Respiratory Tract                                      Infection PCT Algorithm    ----------------------------     ----------------------------         PCT < 0.25 ng/mL                PCT < 0.10 ng/mL         Strongly encourage             Strongly discourage   discontinuation of antibiotics    initiation of antibiotics     ----------------------------     -----------------------------       PCT 0.25 - 0.50 ng/mL            PCT 0.10 - 0.25 ng/mL               OR       >80% decrease in PCT            Discourage initiation of                                            antibiotics      Encourage discontinuation           of antibiotics    ----------------------------     -----------------------------         PCT >= 0.50 ng/mL              PCT 0.26 - 0.50 ng/mL               AND        <80% decrease in PCT             Encourage initiation of                                             antibiotics       Encourage continuation           of antibiotics    ----------------------------     -----------------------------        PCT >= 0.50 ng/mL  PCT > 0.50 ng/mL               AND         increase in PCT                  Strongly encourage                                      initiation of antibiotics    Strongly encourage escalation           of antibiotics                                     -----------------------------                                           PCT <= 0.25 ng/mL                                                 OR                                        > 80% decrease in PCT                                     Discontinue / Do not initiate                                             antibiotics Performed at [facility], [address] [place], [place] [address]   Comprehensive metabolic panel     Status: Abnormal   Collection Time: 05/20/18  5:30 AM  Result Value Ref Range   Sodium 145 135 - 145 mmol/L   Potassium 4.8 3.5 - 5.1 mmol/L   Chloride 115 (H) 98 - 111 mmol/L   CO2 25 22 - 32 mmol/L   Glucose, Bld 187 (H) 70 - 99 mg/dL   BUN 70 (H) 8 - 23 mg/dL   Creatinine, Ser 1.39 (H) 0.44 - 1.00 mg/dL   Calcium 8.4 (L) 8.9 - 10.3 mg/dL   Total Protein 5.7 (L) 6.5 - 8.1 g/dL   Albumin 1.8 (L) 3.5 - 5.0 g/dL   AST 17 15 - 41 U/L   ALT 16 0 - 44 U/L   Alkaline Phosphatase 71  38 - 126 U/L   Total Bilirubin 0.5 0.3 - 1.2 mg/dL   GFR calc non Af Amer 38 (L) >60 mL/min   GFR calc Af Amer 44 (L) >60 mL/min    Comment: (NOTE) The eGFR has been calculated using the CKD EPI equation. This calculation has not been validated in all clinical situations. eGFR's persistently <60 mL/min signify possible Chronic Kidney Disease.    Anion gap 5 5 - 15    Comment: Performed at [facility], [address] [place], [place] [address]  CBC     Status: Abnormal   Collection Time: 05/20/18  5:30 AM  Result Value Ref Range   WBC 22.4 (H) 4.0 - 10.5 K/uL   RBC 2.98 (L) 3.87 - 5.11 MIL/uL   Hemoglobin 8.4 (L) 12.0 - 15.0 g/dL   HCT 28.0 (L) 36.0 - 46.0 %   MCV 94.0 80.0 - 100.0 fL   MCH 28.2 26.0 - 34.0 pg   MCHC 30.0 30.0 - 36.0 g/dL   RDW 14.9 11.5 - 15.5 %   Platelets 162 150 - 400 K/uL   nRBC 0.3 (H) 0.0 - 0.2 %    Comment: Performed at [facility], [address], [place], [place] [address]  Glucose, capillary     Status: Abnormal   Collection Time: 05/20/18  7:28 AM  Result Value Ref Range   Glucose-Capillary 178 (H) 70 - 99 mg/dL  Glucose, capillary     Status: Abnormal   Collection Time: 05/20/18 11:03 AM  Result Value Ref Range   Glucose-Capillary 211 (H) 70 - 99 mg/dL  Glucose, capillary     Status: Abnormal   Collection Time: 05/20/18  4:28 PM  Result Value Ref Range   Glucose-Capillary 191 (H) 70 - 99 mg/dL  Glucose, capillary     Status: Abnormal   Collection Time: 05/20/18  8:16 PM  Result Value Ref Range   Glucose-Capillary 196 (H) 70 - 99 mg/dL   Comment 1 Notify RN    Comment 2 Document in Chart   Glucose, capillary     Status: Abnormal   Collection Time: 05/20/18 11:52 PM  Result Value Ref Range   Glucose-Capillary 162 (H) 70 - 99 mg/dL   Comment 1 Notify RN    Comment 2 Document in Chart   Procalcitonin     Status: None   Collection Time: 05/21/18  4:19 AM  Result Value Ref Range   Procalcitonin 10.81 ng/mL    Comment:         Interpretation: PCT >= 10 ng/mL: Important systemic inflammatory response, almost exclusively due to severe bacterial sepsis or septic shock. (NOTE)       Sepsis PCT Algorithm           Lower Respiratory Tract                                      Infection PCT Algorithm    ----------------------------     ----------------------------         PCT < 0.25 ng/mL                PCT < 0.10 ng/mL         Strongly encourage             Strongly discourage   discontinuation of antibiotics    initiation of antibiotics    ----------------------------     -----------------------------       PCT 0.25 - 0.50 ng/mL            PCT 0.10 - 0.25 ng/mL               OR       >80% decrease in PCT            Discourage initiation of  antibiotics      Encourage discontinuation           of antibiotics    ----------------------------     -----------------------------         PCT >= 0.50 ng/mL              PCT 0.26 - 0.50 ng/mL                AND       <80% decrease in PCT             Encourage initiation of                                             antibiotics       Encourage continuation           of antibiotics    ----------------------------     -----------------------------        PCT >= 0.50 ng/mL                  PCT > 0.50 ng/mL               AND         increase in PCT                  Strongly encourage                                      initiation of antibiotics    Strongly encourage escalation           of antibiotics                                     -----------------------------                                           PCT <= 0.25 ng/mL                                                 OR                                        > 80% decrease in PCT                                     Discontinue / Do not initiate                                             antibiotics Performed at [facility], [address], [place], [place] [address]    Basic metabolic panel     Status: Abnormal   Collection Time: 05/21/18  4:19 AM  Result Value Ref Range   Sodium 148 (H) 135 - 145 mmol/L   Potassium 5.3 (H) 3.5 - 5.1 mmol/L   Chloride 117 (H) 98 - 111 mmol/L   CO2 25 22 - 32 mmol/L   Glucose, Bld 164 (H) 70 - 99 mg/dL   BUN 88 (H) 8 - 23 mg/dL   Creatinine, Ser 1.53 (H) 0.44 - 1.00 mg/dL   Calcium 8.6 (L) 8.9 - 10.3 mg/dL   GFR calc non Af Amer 34 (L) >60 mL/min   GFR calc Af Amer 39 (L) >60 mL/min    Comment: (NOTE) The eGFR has been calculated using the CKD EPI equation. This calculation has not been validated in all clinical situations. eGFR's persistently <60 mL/min signify possible Chronic Kidney Disease.    Anion gap 6 5 - 15    Comment: Performed at [facility], [address], [place], [place] [address]  CBC with Differential/Platelet     Status: Abnormal   Collection Time: 05/21/18  4:19 AM  Result Value Ref Range   WBC 21.2 (H) 4.0 - 10.5 K/uL   RBC 2.84 (L) 3.87 - 5.11 MIL/uL   Hemoglobin 8.1 (L) 12.0 - 15.0 g/dL   HCT 26.5 (L) 36.0 - 46.0 %   MCV 93.3 80.0 - 100.0 fL   MCH 28.5 26.0 - 34.0 pg   MCHC 30.6 30.0 - 36.0 g/dL   RDW 14.7 11.5 - 15.5 %   Platelets 132 (L) 150 - 400 K/uL   nRBC 0.4 (H) 0.0 - 0.2 %   Neutrophils Relative % 82 %   Neutro Abs 17.4 (H) 1.7 - 7.7 K/uL   Lymphocytes Relative 7 %   Lymphs Abs 1.5 0.7 - 4.0 K/uL   Monocytes Relative 5 %   Monocytes Absolute 1.0 0.1 - 1.0 K/uL   Eosinophils Relative 0 %   Eosinophils Absolute 0.0 0.0 - 0.5 K/uL   Basophils Relative 0 %   Basophils Absolute 0.0 0.0 - 0.1 K/uL   WBC Morphology      MODERATE LEFT SHIFT (>5% METAS AND MYELOS,OCC PRO NOTED)   Immature Granulocytes 6 %   Abs Immature Granulocytes 1.21 (H) 0.00 - 0.07 K/uL   Polychromasia PRESENT     Comment: Performed at [facility], [address], [place], [place] [address]  Blood gas, arterial     Status: None   Collection Time: 05/21/18  4:25 AM  Result Value Ref Range   FIO2 55.00     Delivery systems VENTILATOR    Mode PRESSURE REGULATED VOLUME CONTROL    VT 600 mL   LHR 22 resp/min   Peep/cpap 8.0 cm H20   pH, Arterial 7.375 7.350 - 7.450   pCO2 arterial 43.5 32.0 - 48.0 mmHg   pO2, Arterial 89.8 83.0 - 108.0 mmHg   Bicarbonate 24.5 20.0 - 28.0 mmol/L   Acid-Base Excess 0.3 0.0 - 2.0 mmol/L   O2 Saturation 96.5 %   Collection site RADIAL    Drawn by [id number]    Sample type ARTERIAL    Allens test (pass/fail) PASS PASS   Mechanical Rate 22     Comment: Performed at [facility], [address], [place], [place] [address]  Glucose, capillary     Status: Abnormal   Collection Time: 05/21/18  4:57 AM  Result Value Ref Range   Glucose-Capillary 184 (H) 70 - 99 mg/dL  Glucose, capillary     Status: Abnormal   Collection Time: 05/21/18  7:23 AM  Result Value Ref Range   Glucose-Capillary 167 (H) 70 - 99 mg/dL    ABGS Recent Labs    05/21/18 0425  PHART 7.375  PO2ART 89.8  HCO3 24.5   CULTURES Recent Results (from the past 240 hour(s))  Urine culture     Status: None   Collection Time: 05/24/2018 11:52 AM  Result Value Ref Range Status   Specimen Description   Final    URINE, RANDOM Performed at [facility], [address] [place], [place] [address]    Special Requests   Final    NONE Performed at [facility], [address], [place], [place] [address]    Culture   Final    NO GROWTH Performed at [facility], [place] [address] [place], [place] [address]    Report Status 05/12/2018 FINAL  Final  Culture, blood (routine x 2)     Status: None   Collection Time: 04/29/2018 12:38 PM  Result Value Ref Range Status   Specimen Description BLOOD RIGHT ARM  Final   Special Requests   Final    BOTTLES DRAWN AEROBIC AND ANAEROBIC Blood Culture adequate volume   Culture   Final    NO GROWTH 5 DAYS Performed at [facility], [address], [place], [place] [address]    Report Status 05/16/2018 FINAL  Final  Culture, blood (routine x 2)     Status:  None   Collection Time: 05/06/2018 12:38 PM  Result Value Ref Range Status   Specimen Description BLOOD RIGHT HAND  Final   Special Requests   Final    BOTTLES DRAWN AEROBIC AND ANAEROBIC Blood Culture adequate volume   Culture   Final    NO GROWTH 5 DAYS Performed at [facility], [address], [place], [place] [address]    Report Status 05/16/2018 FINAL  Final  MRSA PCR Screening     Status: None   Collection Time: 05/15/18  7:17 AM  Result Value Ref Range Status   MRSA by PCR NEGATIVE NEGATIVE Final    Comment:        The GeneXpert MRSA Assay (FDA approved for NASAL specimens only), is one component of a comprehensive MRSA colonization surveillance program. It is not intended to diagnose MRSA infection nor to guide or monitor treatment for MRSA infections. Performed at [facility], [address] [place], [place] [address]   Respiratory Panel by PCR     Status: Abnormal   Collection Time: 05/17/18  5:45 PM  Result Value Ref Range Status   Adenovirus NOT DETECTED NOT DETECTED Final   Coronavirus 229E NOT DETECTED NOT DETECTED Final   Coronavirus HKU1 NOT DETECTED NOT DETECTED Final   Coronavirus NL63 NOT DETECTED NOT DETECTED Final   Coronavirus OC43 NOT DETECTED NOT DETECTED Final   Metapneumovirus NOT DETECTED NOT DETECTED Final   Rhinovirus / Enterovirus DETECTED (A) NOT DETECTED Final   Influenza A NOT DETECTED NOT DETECTED Final   Influenza B NOT DETECTED NOT DETECTED Final   Parainfluenza Virus 1 NOT DETECTED NOT DETECTED Final   Parainfluenza Virus 2 NOT DETECTED NOT DETECTED Final   Parainfluenza Virus 3 NOT DETECTED NOT DETECTED Final   Parainfluenza Virus 4 NOT DETECTED NOT DETECTED Final   Respiratory Syncytial Virus NOT DETECTED NOT DETECTED Final   Bordetella pertussis NOT DETECTED NOT DETECTED Final   Chlamydophila pneumoniae NOT DETECTED NOT DETECTED Final   Mycoplasma pneumoniae NOT DETECTED NOT DETECTED Final    Comment: Performed  at [facility], [place] [address] [place], [place] [address]  Culture, respiratory (non-expectorated)     Status: None   Collection Time: 05/17/18  5:45 PM  Result Value Ref Range Status   Specimen Description   Final    TRACHEAL ASPIRATE Performed at [facility], [address] [place], [place] [address]    Special Requests   Final    NONE Performed at [facility], [address], [place], [place] [address]    Gram Stain   Final    RARE WBC PRESENT, PREDOMINANTLY PMN NO ORGANISMS SEEN Performed at [place] [address] [place], [place] [address]    Culture FEW CANDIDA ALBICANS  Final   Report Status 05/20/2018 FINAL  Final  Culture, blood (Routine X 2) w Reflex to ID Panel     Status: None (Preliminary result)   Collection Time: 05/19/18 11:06 AM  Result Value Ref Range Status   Specimen Description BLOOD PICC LINE DRAWN BY RN  Final   Special Requests   Final    BOTTLES DRAWN AEROBIC AND ANAEROBIC Blood Culture adequate volume   Culture   Final    NO GROWTH 2 DAYS Performed at [facility], [address] [place], [place] [address]    Report Status PENDING  Incomplete  Culture, blood (Routine X 2) w Reflex to ID Panel     Status: None (Preliminary result)   Collection Time: 05/19/18 11:09 AM  Result Value Ref Range Status   Specimen Description BLOOD LEFT HAND DRAWN BY RN  Final   Special Requests   Final    BOTTLES DRAWN AEROBIC AND ANAEROBIC Blood Culture results may not be optimal due to an excessive volume of blood received in culture bottles   Culture   Final    NO GROWTH 2 DAYS Performed at [facility], [address], [place], [place] [address]    Report Status PENDING  Incomplete  Culture, Urine     Status: None   Collection Time: 05/19/18 11:45 AM  Result Value Ref Range Status   Specimen Description   Final    URINE, CATHETERIZED Performed at [facility], [address], [place], [place] [address]    Special Requests   Final    NONE Performed at  [facility], [address] [place], [place] [address]    Culture   Final    NO GROWTH Performed at [facility], [place] [address], [place], [place] [address]    Report Status 05/21/2018 FINAL  Final   Studies/Results: Dg Chest Port 1 View  Result Date: 05/21/2018 CLINICAL DATA:  Respiratory failure, ventilatory support EXAM: PORTABLE CHEST 1 VIEW COMPARISON:  05/20/2018 FINDINGS: Endotracheal tube 4.4 cm above the carina. Exam is rotated to the right. Right PICC line tip upper SVC level. NG tube enters the proximal stomach. Minimal improvement in asymmetric patchy bilateral airspace disease/pneumonia involving the right upper lobe and both lower lobes. No enlarging effusion by plain radiography. No large pneumothorax. Overall stable exam compared to yesterday. IMPRESSION: Stable support apparatus Persistent bilateral patchy airspace process, minimal improvement, more compatible with multifocal pneumonia by comparison CT Electronically Signed   By: Jerilynn Mages.  Shick M.D.   On: 05/21/2018 08:50   Dg Chest Port 1 View  Result Date: 05/20/2018 CLINICAL DATA:  Hypoxia.  Acute respiratory failure EXAM: PORTABLE CHEST 1 VIEW COMPARISON:  Chest radiograph 05/19/2018 FINDINGS: ET tube terminates in the mid trachea. Right upper extremity PICC line  tip projects over the superior vena cava. Enteric tube courses inferior to the diaphragm. Monitoring leads overlie the patient. Stable cardiac and mediastinal contours. Similar-appearing bilateral interstitial pulmonary opacities. No pleural effusion or pneumothorax. IMPRESSION: Support apparatus as above. Similar-appearing interstitial opacities favored to represent edema. Electronically Signed   By: Lovey Newcomer M.D.   On: 05/20/2018 08:58   Dg Chest Port 1 View  Result Date: 05/19/2018 CLINICAL DATA:  Nasogastric tube placement. EXAM: PORTABLE CHEST 1 VIEW COMPARISON:  Radiograph of May 18, 2018. FINDINGS: Stable cardiomediastinal silhouette. Endotracheal  and nasogastric tubes are unchanged in position. Right-sided PICC line is unchanged in position. No pneumothorax is noted. Mildly improved bilateral lung opacities are noted most consistent with improving edema. Small pleural effusions may be present. Bony thorax is unremarkable. IMPRESSION: Stable support apparatus. Probable mildly improved bilateral pulmonary edema. Electronically Signed   By: Marijo Conception, M.D.   On: 05/19/2018 10:23    Medications:  Prior to Admission:  Medications Prior to Admission  Medication Sig Dispense Refill Last Dose  . ALPRAZolam (XANAX) 0.5 MG tablet Take 0.5 mg by mouth daily as needed.    Past Month at Unknown time  . aspirin 81 MG tablet Take 81 mg by mouth every other day.    Past Week at Unknown time  . cetirizine (ZYRTEC ALLERGY) 10 MG tablet Take 10 mg by mouth daily.   Past Week at Unknown time  . doxycycline (VIBRA-TABS) 100 MG tablet Take 100 mg by mouth 2 (two) times daily.   05/10/2018 at 1800  . levothyroxine (SYNTHROID, LEVOTHROID) 50 MCG tablet Take 50 mcg by mouth as directed. Patient takes 8mg on Saturday and Sunday; all other days she takes 760m   05/07/2018 at 1000  . levothyroxine (SYNTHROID, LEVOTHROID) 75 MCG tablet Take 75 mcg by mouth as directed. Patient takes 7529mevery day except Saturday and Sunday; on Saturday and Sunday, she takes 34m69m 05/15/2018 at 1000  . lisinopril (PRINIVIL,ZESTRIL) 20 MG tablet TAKE 1/2 TABLET BY MOUTH ONCE DAILY. 45 tablet 3 05/10/2018 at 1800  . pravastatin (PRAVACHOL) 40 MG tablet TAKE ONE TABLET BY MOUTH IN THE EVENING. 90 tablet 2 05/10/2018 at 1800  . Pseudoephedrine-Guaifenesin (MUCINEX D MAX STRENGTH) [phone] MG TB12 Take 1 tablet by mouth daily.   05/10/2018 at Unknown time  . verapamil (VERELAN PM) 240 MG 24 hr capsule TAKE (1) CAPSULE BY MOUTH AT BEDTIME. 30 capsule 2 05/10/2018 at 1800  . zolpidem (AMBIEN) 10 MG tablet Take 10 mg by mouth at bedtime.    05/10/2018 at Unknown time    Scheduled: . aspirin EC  81 mg Oral Q48H  . budesonide (PULMICORT) nebulizer solution  0.5 mg Nebulization BID  . chlorhexidine gluconate (MEDLINE KIT)  15 mL Mouth Rinse BID  . Chlorhexidine Gluconate Cloth  6 each Topical Daily  . diltiazem  60 mg Oral Q6H  . docusate  100 mg Oral BID  . enoxaparin (LOVENOX) injection  40 mg Subcutaneous Q24H  . feeding supplement (PRO-STAT SUGAR FREE 64)  30 mL Per Tube Daily  . feeding supplement (VITAL AF 1.2 CAL)  1,000 mL Per Tube Q24H  . fentaNYL (SUBLIMAZE) injection  50 mcg Intravenous Once  . insulin aspart  2-6 Units Subcutaneous Q4H  . ipratropium  0.5 mg Nebulization Q6H  . levalbuterol  1.25 mg Nebulization Q6H  . levothyroxine  37.5 mcg Intravenous Daily  . mouth rinse  15 mL Mouth Rinse 10 times per day  .  methylPREDNISolone (SOLU-MEDROL) injection  60 mg Intravenous Q6H  . pantoprazole (PROTONIX) IV  40 mg Intravenous Q24H  . sodium chloride flush  10-40 mL Intracatheter Q12H  . sodium chloride flush  3 mL Intravenous Q12H   Continuous: . sodium chloride 10 mL/hr at 05/21/18 0313  . ceFEPime (MAXIPIME) IV Stopped (05/20/18 2259)  . fentaNYL infusion INTRAVENOUS 150 mcg/hr (05/21/18 0530)   RCV:ELFYBO chloride, acetaminophen **OR** acetaminophen, albuterol, bisacodyl, fentaNYL, fentaNYL (SUBLIMAZE) injection, midazolam, ondansetron **OR** ondansetron (ZOFRAN) IV, sodium chloride flush  Assesment: She was admitted with acute hypoxic respiratory failure with sepsis.  She has multifocal pneumonia.  She is slowly improving.  She eventually required intubation and mechanical ventilation but she is improving and now is on 50% oxygen and her chest x-ray which I have personally reviewed is improved and looks about like it did yesterday allowing for differences in technique.  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  Continue weaning process.  Add Dulcolax and Colace    LOS: 9 days   Markee Matera L 05/21/2018, 9:23 AM

## 2018-05-21 NOTE — Progress Notes (Signed)
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 She had gone to her PCP a few days prior for a week of fevers, chills, increasing cough, and diagnosed with pneumonia, started on doxycycline and nebulized breathing treatments at home but has had no improvement. Over the past 24 hours she's grown increasingly weak, getting up out of bed rarely, much more short of breath when she attempts this and constantly feels like she's about to pass out. On initial evaluation she was afebrile, but was 100.7Fat home prior to taking tylenol. Tachycardic with soft BP, hypoxic to 88% on room air, improved with 2L O2, tachypneic. WBC 11.3k, flu negative, lactic acid elevated. Levaquin and IV fluids given after blood cultures drawn.She continued to be hypoxic and developed wheezing for which steroids and breathing treatments were provided. Respiratory effort increased and hypoxia worsened prompting pulmonology consultation and transfer to SDU/ICU on 11/18.BiPAP started with improvement in oxygenation. The patient did not show any improvement on BiPAP. She was intubated on 05/17/18.  Assessment/Plan: Acute hypoxic respiratory failure: -Due to multifocal pneumonia, bronchospasmandan element of interstitial edema. - Continue broad antimicrobial coverage, -Stopped vancomycin as she failed an adequate trial of doxycycline, MRSA negative. -continuesolumedrol to 60 IV q 6 - Intubated 05/17/18 -received lasix 11/18,11/19, 11/21 11/24-repeat CXR--personally reviewed--slight improvement RLL, LLL opacities; improving interstitial markings -FiO2 down to 0.5; remains on PRVC, Rate 22, Vt 600  Sepsis  due to multilobar pneumonia: -Flu negative. -RVP = rhino/entervirus - Continuecefepime -PCT declining 0.49>>>0.34>>>10.81 -tracheal aspirate for culture--C.albicans-->unclear clinicaly significance -running low grade fever -repeat blood culture neg -re[eat UA without pyuria -d/c levofloxacin -continue cefepime  COPD Exacerbation -pt has at least 20 pack year history of tobacco -continuesolumedrol -conitinuepulmicort -continue bronchodilators  FEN--Hyperkalemia/Hypernatremia -placedOG -continueenteral feeding--tolerating -check mag--2.6 -check phos--4.8 -start 1/2NS  HTN: - Continue to hold lisinopril -continue diltiazem  Tachycardia/atrial tachycardia -questionintermittentatrial tachycardia/SVT -Troponin negative -EKG--sinus--nonspecific T wave changes -change albuterol to xopenex -increase dilitazem 60 mg po q6 (on verapamil at home) -05/15/18 Echo--EF 55-60%, no WMA, normal RV -personally reviewed EKG--sinus/atrial tachycardia, no STT changes  Hypothyroidism:  - Continue home variable synthroid dosing. -change synthroid to IV  Thrombocytosis>>>>Thrombocytopenia -d/c lovenox -SCDs  Leukocytosis: -Difficult to distinguish between infection vs. steroid-induced.  - Continue monitoring. -pt remains afebrileand hemodynamically stable -left shift present  Acute on chronic renal failureStage III CKD: -Creatinine at baseline on admission.Had lasix, vancomycin, and contrast but seems to be stable. -Continues to be stable, will monitor with redosing lasix. -baseline creatinine 0.8-1.1 -serum creatinine up due to furosemide -maintain even/negative fluid balance  Carotid stenosis: -Continue QOD ASA.  Hyperlipidemia: -Continue statin  Left Heel Pressure Injury -not present during admission -place prevalon boots    Disposition Plan:remain inICU Family Communication: Family at bedside  updated11/24  Consultants:pulmonary  Code Status: FULL   DVT Prophylaxis:[place] Lovenox   Procedures: As Listed in Progress Note Above  Antibiotics: vanco 11/18 levoflox 11/14>>>11/22 Cefepime 11/18>>>   The patient is critically ill with multiple organ systems failure and requires high complexity decision making for assessment and support, frequent evaluation and titration of therapies, application of advanced monitoring technologies and extensive interpretation of multiple databases.  Critical care time -35 mins.  Subjective: Pt remains on ventilator.  Fentanyl at 181mg/min.  No BM.  No vomiting. No respiratory distress.  Tolerating TF  Objective: Vitals:   05/21/18 0630 05/21/18 0721 05/21/18 0800 05/21/18 0900  BP: (!) 110/48  (!) 116/53 (!) 123/53  Pulse: 96 97 97 94  Resp: 19 (!) '21 19 19  '$ Temp:  (!) 100.6 F (38.1 C)    TempSrc:  Axillary    SpO2: 94% 95%    Weight:      Height:        Intake/Output Summary (Last 24 hours) at 05/21/2018 0957 Last data filed at 05/21/2018 0442 Gross per 24 hour  Intake 1957.08 ml  Output 1575 ml  Net 382.08 ml   Weight change: 0.5 kg Exam:   General:  Pt intubated on vent.  No distress.  HEENT: No icterus, No thrush, No neck mass, [place]/AT  Cardiovascular: RRR, S1/S2, no rubs, no gallops  Respiratory: bibasilar rales, no wheeze  Abdomen: Soft/+BS, non tender, non distended, no guarding  Extremities: No edema, No lymphangitis, No petechiae, No rashes, no synovitis   Data Reviewed: I have personally reviewed following labs and imaging studies Basic Metabolic Panel: Recent Labs  Lab 05/17/18 0413 05/17/18 1026 05/18/18 0356 05/19/18 0533 05/20/18 0530 05/21/18 0419  NA 140  --  141 143 145 148*  K 3.6  --  4.3 4.8 4.8 5.3*  CL 109  --  111 112* 115* 117*  CO2 22  --  '23 24 25 25  '$ GLUCOSE 142*  --  195* 182* 187* 164*  BUN 21  --  32* 56* 70* 88*  CREATININE 1.02*  --  1.21* 1.43* 1.39*  1.53*  CALCIUM 8.3*  --  8.6* 8.6* 8.4* 8.6*  MG  --  2.1  --  2.6*  --   --   PHOS  --  4.0 3.7 4.8*  --   --    Liver Function Tests: Recent Labs  Lab 05/15/18 0801 05/18/18 0356 05/20/18 0530  AST '30 21 17  '$ ALT '16 22 16  '$ ALKPHOS 73 87 71  BILITOT 0.4 0.5 0.5  PROT 6.2* 5.9* 5.7*  ALBUMIN 2.0* 1.9* 1.8*   No results for input(s): LIPASE, AMYLASE in the last 168 hours. No results for input(s): AMMONIA in the last 168 hours. Coagulation Profile: No results for input(s): INR, PROTIME in the last 168 hours. CBC: Recent Labs  Lab 05/17/18 0413 05/18/18 0356 05/19/18 0533 05/20/18 0530 05/21/18 0419  WBC 19.3* 20.5* 24.2* 22.4* 21.2*  NEUTROABS  --   --  21.6*  --  17.4*  HGB 8.8* 8.8* 9.4* 8.4* 8.1*  HCT 27.7* 28.4* 31.0* 28.0* 26.5*  MCV 87.7 89.9 93.9 94.0 93.3  PLT 427* 332 235 162 132*   Cardiac Enzymes: No results for input(s): CKTOTAL, CKMB, CKMBINDEX, TROPONINI in the last 168 hours. BNP: Invalid input(s): POCBNP CBG: Recent Labs  Lab 05/20/18 1628 05/20/18 2016 05/20/18 2352 05/21/18 0457 05/21/18 0723  GLUCAP 191* 196* 162* 184* 167*   HbA1C: No results for input(s): HGBA1C in the last 72 hours. Urine analysis:    Component Value Date/Time   COLORURINE YELLOW 05/19/2018 1145   APPEARANCEUR HAZY (A) 05/19/2018 1145   LABSPEC 1.020 05/19/2018 1145   PHURINE 6.0 05/19/2018 1145   GLUCOSEU NEGATIVE 05/19/2018 1145   HGBUR LARGE (A) 05/19/2018 1145   BILIRUBINUR NEGATIVE 05/19/2018 1145   KETONESUR NEGATIVE 05/19/2018 1145   PROTEINUR 30 (A) 05/19/2018 1145   UROBILINOGEN 0.2 01/07/2010 1316  NITRITE NEGATIVE 05/19/2018 1145   LEUKOCYTESUR NEGATIVE 05/19/2018 1145   Sepsis Labs: '@LABRCNTIP'$ (procalcitonin:4,lacticidven:4) ) Recent Results (from the past 240 hour(s))  Urine culture     Status: None   Collection Time: 05/19/2018 11:52 AM  Result Value Ref Range Status   Specimen Description   Final    URINE, RANDOM Performed at [facility], [address], [place], [place] [address]    Special Requests   Final    NONE Performed at [facility], [address] [place], [place] [address]    Culture   Final    NO GROWTH Performed at [facility], [place] [address] [place], [place] [address]    Report Status 05/12/2018 FINAL  Final  Culture, blood (routine x 2)     Status: None   Collection Time: 05/27/2018 12:38 PM  Result Value Ref Range Status   Specimen Description BLOOD RIGHT ARM  Final   Special Requests   Final    BOTTLES DRAWN AEROBIC AND ANAEROBIC Blood Culture adequate volume   Culture   Final    NO GROWTH 5 DAYS Performed at [facility], [address], [place], [place] [address]    Report Status 05/16/2018 FINAL  Final  Culture, blood (routine x 2)     Status: None   Collection Time: 05/23/2018 12:38 PM  Result Value Ref Range Status   Specimen Description BLOOD RIGHT HAND  Final   Special Requests   Final    BOTTLES DRAWN AEROBIC AND ANAEROBIC Blood Culture adequate volume   Culture   Final    NO GROWTH 5 DAYS Performed at [facility], [address], [place], [place] [address]    Report Status 05/16/2018 FINAL  Final  MRSA PCR Screening     Status: None   Collection Time: 05/15/18  7:17 AM  Result Value Ref Range Status   MRSA by PCR NEGATIVE NEGATIVE Final    Comment:        The GeneXpert MRSA Assay (FDA approved for NASAL specimens only), is one component of a comprehensive MRSA colonization surveillance program. It is not intended to diagnose MRSA infection nor to guide or monitor treatment for MRSA infections. Performed at [facility], [address] [place], [place] [address]   Respiratory Panel by PCR     Status: Abnormal   Collection Time: 05/17/18  5:45 PM  Result Value Ref Range Status   Adenovirus NOT DETECTED NOT DETECTED Final   Coronavirus 229E NOT DETECTED NOT DETECTED Final   Coronavirus HKU1 NOT DETECTED NOT DETECTED Final   Coronavirus NL63 NOT DETECTED NOT DETECTED  Final   Coronavirus OC43 NOT DETECTED NOT DETECTED Final   Metapneumovirus NOT DETECTED NOT DETECTED Final   Rhinovirus / Enterovirus DETECTED (A) NOT DETECTED Final   Influenza A NOT DETECTED NOT DETECTED Final   Influenza B NOT DETECTED NOT DETECTED Final   Parainfluenza Virus 1 NOT DETECTED NOT DETECTED Final   Parainfluenza Virus 2 NOT DETECTED NOT DETECTED Final   Parainfluenza Virus 3 NOT DETECTED NOT DETECTED Final   Parainfluenza Virus 4 NOT DETECTED NOT DETECTED Final   Respiratory Syncytial Virus NOT DETECTED NOT DETECTED Final   Bordetella pertussis NOT DETECTED NOT DETECTED Final   Chlamydophila pneumoniae NOT DETECTED NOT DETECTED Final   Mycoplasma pneumoniae NOT DETECTED NOT DETECTED Final    Comment: Performed at [facility], [place] [address] [place], [place] [address]  Culture, respiratory (non-expectorated)     Status: None  Collection Time: 05/17/18  5:45 PM  Result Value Ref Range Status   Specimen Description   Final    TRACHEAL ASPIRATE Performed at [facility], [address], [place], [place] [address]    Special Requests   Final    NONE Performed at [facility], [address] [place], [place] [address]    Gram Stain   Final    RARE WBC PRESENT, PREDOMINANTLY PMN NO ORGANISMS SEEN Performed at [place] [address], [place], [place] [address]    Culture FEW CANDIDA ALBICANS  Final   Report Status 05/20/2018 FINAL  Final  Culture, blood (Routine X 2) w Reflex to ID Panel     Status: None (Preliminary result)   Collection Time: 05/19/18 11:06 AM  Result Value Ref Range Status   Specimen Description BLOOD PICC LINE DRAWN BY RN  Final   Special Requests   Final    BOTTLES DRAWN AEROBIC AND ANAEROBIC Blood Culture adequate volume   Culture   Final    NO GROWTH 2 DAYS Performed at [facility], [address] [place], [place] [address]    Report Status PENDING  Incomplete  Culture, blood (Routine X 2) w Reflex to ID Panel     Status:  None (Preliminary result)   Collection Time: 05/19/18 11:09 AM  Result Value Ref Range Status   Specimen Description BLOOD LEFT HAND DRAWN BY RN  Final   Special Requests   Final    BOTTLES DRAWN AEROBIC AND ANAEROBIC Blood Culture results may not be optimal due to an excessive volume of blood received in culture bottles   Culture   Final    NO GROWTH 2 DAYS Performed at [facility], [address] [place], [place] [address]    Report Status PENDING  Incomplete  Culture, Urine     Status: None   Collection Time: 05/19/18 11:45 AM  Result Value Ref Range Status   Specimen Description   Final    URINE, CATHETERIZED Performed at [facility], [address], [place], [place] [address]    Special Requests   Final    NONE Performed at [facility], [address], [place], [place] [address]    Culture   Final    NO GROWTH Performed at [facility], [place] [address], [place],  [address]    Report Status 05/21/2018 FINAL  Final     Scheduled Meds: . aspirin EC  81 mg Oral Q48H  . budesonide (PULMICORT) nebulizer solution  0.5 mg Nebulization BID  . chlorhexidine gluconate (MEDLINE KIT)  15 mL Mouth Rinse BID  . Chlorhexidine Gluconate Cloth  6 each Topical Daily  . diltiazem  60 mg Oral Q6H  . docusate  100 mg Oral BID  . feeding supplement (PRO-STAT SUGAR FREE 64)  30 mL Per Tube Daily  . feeding supplement (VITAL AF 1.2 CAL)  1,000 mL Per Tube Q24H  . fentaNYL (SUBLIMAZE) injection  50 mcg Intravenous Once  . insulin aspart  2-6 Units Subcutaneous Q4H  . ipratropium  0.5 mg Nebulization Q6H  . levalbuterol  1.25 mg Nebulization Q6H  . levothyroxine  37.5 mcg Intravenous Daily  . mouth rinse  15 mL Mouth Rinse 10 times per day  . methylPREDNISolone (SOLU-MEDROL) injection  60 mg Intravenous Q6H  . pantoprazole (PROTONIX) IV  40 mg Intravenous Q24H  . sodium chloride flush  10-40 mL Intracatheter Q12H  . sodium chloride flush  3 mL Intravenous  Q12H   Continuous  Infusions: . sodium chloride    . sodium chloride 10 mL/hr at 05/21/18 0313  . ceFEPime (MAXIPIME) IV Stopped (05/20/18 2259)  . fentaNYL infusion INTRAVENOUS 150 mcg/hr (05/21/18 0530)    Procedures/Studies: Dg Chest 2 View  Result Date: 05/03/2018 CLINICAL DATA:  Recent pneumonia with persistent cough, fever, and shortness of breath. EXAM: CHEST - 2 VIEW COMPARISON:  May 08, 2018 FINDINGS: Extensive airspace disease is noted throughout much of the right upper and lower lobe regions. There is also patchy infiltrate in the left lower lobe. The degree of consolidation on the right is stable. There is slight the increased consolidation in the left base. Heart size and pulmonary vascularity are normal. No adenopathy. No bone lesions. IMPRESSION: Multifocal pneumonia. Areas of airspace opacity in the right upper and lower lobes appears essentially stable compared to 3 days prior. Increase in infiltrate left base. Stable cardiac silhouette. No adenopathy evident. Electronically Signed   By: Lowella Grip III M.D.   On: 04/30/2018 12:57   Dg Chest 2 View  Result Date: 05/08/2018 CLINICAL DATA:  Productive cough and fever for a couple weeks, former smoker, history hypertension, cardiomyopathy EXAM: CHEST - 2 VIEW COMPARISON:  None FINDINGS: Normal heart size, mediastinal contours, and pulmonary vascularity. Scattered opacities throughout the RIGHT lung likely representing pneumonia. However these are somewhat patchy/nodular in appearance and require follow-up until resolution to exclude underlying abnormalities including tumor. Minimal atelectasis versus consolidation in LEFT lower lobe. Remaining LEFT lung clear. Elevation of RIGHT diaphragm. No pleural effusion or pneumothorax. IMPRESSION: Predominately RIGHT-sided pulmonary infiltrates consistent with pneumonia. Followup PA and lateral chest X-ray is recommended in 3-4 weeks following trial of antibiotic therapy to ensure resolution and exclude  underlying malignancy. These results will be called to the ordering clinician or representative by the Radiologist Assistant, and communication documented in the PACS or zVision Dashboard. Electronically Signed   By: Lavonia Dana M.D.   On: 05/08/2018 11:45   Ct Angio Chest Pe W Or Wo Contrast  Result Date: 05/14/2018 CLINICAL DATA:  Shortness of breath worsening today. Clinical concern for pulmonary embolus. Positive D-dimer. EXAM: CT ANGIOGRAPHY CHEST WITH CONTRAST TECHNIQUE: Multidetector CT imaging of the chest was performed using the standard protocol during bolus administration of intravenous contrast. Multiplanar CT image reconstructions and MIPs were obtained to evaluate the vascular anatomy. CONTRAST:  44m ISOVUE-370 IOPAMIDOL (ISOVUE-370) INJECTION 76% COMPARISON:  None FINDINGS: Cardiovascular: The heart size is normal. No substantial pericardial effusion. No thoracic aortic aneurysm. No filling defect in the opacified pulmonary arteries to suggest the presence of an acute pulmonary embolus. Mediastinum/Nodes: 14 mm short axis subcarinal lymph node is associated with mild bilateral hilar lymphadenopathy. The esophagus has normal imaging features. There is no axillary lymphadenopathy. Lungs/Pleura: The central tracheobronchial airways are patent. Asymmetric patchy airspace disease, right greater than left is associated with interlobular septal thickening. Confluent airspace disease noted in the right lower lobe posterior left lower lobe. Small to moderate right pleural effusion evident with small left pleural effusion. Upper Abdomen: The liver shows diffusely decreased attenuation suggesting steatosis. Musculoskeletal: No worrisome lytic or sclerotic osseous abnormality. Review of the MIP images confirms the above findings. IMPRESSION: 1. No CT evidence for acute pulmonary embolus. 2. Bilateral patchy, right greater than left airspace disease compatible with multifocal pneumonia. 3. Mild mediastinal  and bilateral hilar lymphadenopathy. 4. Small to moderate bilateral pleural effusions.  The Electronically Signed   By: EMisty StanleyM.D.   On: 05/14/2018 17:35  Dg Chest Port 1 View  Result Date: 05/21/2018 CLINICAL DATA:  Respiratory failure, ventilatory support EXAM: PORTABLE CHEST 1 VIEW COMPARISON:  05/20/2018 FINDINGS: Endotracheal tube 4.4 cm above the carina. Exam is rotated to the right. Right PICC line tip upper SVC level. NG tube enters the proximal stomach. Minimal improvement in asymmetric patchy bilateral airspace disease/pneumonia involving the right upper lobe and both lower lobes. No enlarging effusion by plain radiography. No large pneumothorax. Overall stable exam compared to yesterday. IMPRESSION: Stable support apparatus Persistent bilateral patchy airspace process, minimal improvement, more compatible with multifocal pneumonia by comparison CT Electronically Signed   By: Jerilynn Mages.  Shick M.D.   On: 05/21/2018 08:50   Dg Chest Port 1 View  Result Date: 05/20/2018 CLINICAL DATA:  Hypoxia.  Acute respiratory failure EXAM: PORTABLE CHEST 1 VIEW COMPARISON:  Chest radiograph 05/19/2018 FINDINGS: ET tube terminates in the mid trachea. Right upper extremity PICC line tip projects over the superior vena cava. Enteric tube courses inferior to the diaphragm. Monitoring leads overlie the patient. Stable cardiac and mediastinal contours. Similar-appearing bilateral interstitial pulmonary opacities. No pleural effusion or pneumothorax. IMPRESSION: Support apparatus as above. Similar-appearing interstitial opacities favored to represent edema. Electronically Signed   By: Lovey Newcomer M.D.   On: 05/20/2018 08:58   Dg Chest Port 1 View  Result Date: 05/19/2018 CLINICAL DATA:  Nasogastric tube placement. EXAM: PORTABLE CHEST 1 VIEW COMPARISON:  Radiograph of May 18, 2018. FINDINGS: Stable cardiomediastinal silhouette. Endotracheal and nasogastric tubes are unchanged in position. Right-sided PICC  line is unchanged in position. No pneumothorax is noted. Mildly improved bilateral lung opacities are noted most consistent with improving edema. Small pleural effusions may be present. Bony thorax is unremarkable. IMPRESSION: Stable support apparatus. Probable mildly improved bilateral pulmonary edema. Electronically Signed   By: Marijo Conception, M.D.   On: 05/19/2018 10:23   Dg Chest Port 1 View  Result Date: 05/18/2018 CLINICAL DATA:  Recent pneumonia EXAM: PORTABLE CHEST 1 VIEW COMPARISON:  Yesterday FINDINGS: Endotracheal tube tip at the clavicular heads. An orogastric tube reaches the stomach at least. Right upper extremity PICC in the interim with tip at the SVC. Extensive interstitial and airspace opacity with a patchy appearance. No visible effusion or pneumothorax. Normal heart size. Artifact from EKG leads. IMPRESSION: 1. Unremarkable hardware positioning. 2. Multifocal pneumonia with possible edema. Electronically Signed   By: Monte Fantasia M.D.   On: 05/18/2018 07:18   Portable Chest X-ray  Result Date: 05/17/2018 CLINICAL DATA:  Evaluate endotracheal tube placement EXAM: PORTABLE CHEST 1 VIEW COMPARISON:  Portable chest x-ray of 05/17/2017 FINDINGS: Endotracheal tube has now been placed with the tip approximately 2.7 cm above the carina. There is little change in patchy lung opacities greatest in the right mid and upper lung which appears most typical of pneumonia. However as noted on the prior dictation, superimposed edema cannot be excluded. Mild cardiomegaly is stable. IMPRESSION: 1. Tip of endotracheal tube 2.7 cm above the carina. 2. Little change in lung opacities as noted previously suspicious for pneumonia and possibly superimposed edema. Electronically Signed   By: Ivar Drape M.D.   On: 05/17/2018 10:12   Dg Chest Port 1 View  Result Date: 05/17/2018 CLINICAL DATA:  Respiratory failure EXAM: PORTABLE CHEST 1 VIEW COMPARISON:  05/16/2018 FINDINGS: Right upper lobe and left  lower lobe opacities, suspicious for pneumonia. Superimposed interstitial opacities, suspicious for interstitial edema. No definite pleural effusions. The heart is normal in size. No pneumothorax. IMPRESSION: Multifocal opacities, right upper  lobe predominant, suspicious for pneumonia. Superimposed interstitial edema is also suspected. Overall appearance is similar to the prior. Electronically Signed   By: Julian Hy M.D.   On: 05/17/2018 09:12   Dg Chest Port 1 View  Result Date: 05/16/2018 CLINICAL DATA:  Respiratory failure EXAM: PORTABLE CHEST 1 VIEW COMPARISON:  CTA chest dated 05/14/2018 FINDINGS: Multifocal patchy opacities, right upper lobe predominant, suspicious for pneumonia. Superimposed mild interstitial edema is possible. No definite pleural effusion. No pneumothorax. The heart is normal in size. IMPRESSION: Multifocal patchy opacities, right upper lobe predominant, suspicious for pneumonia. Superimposed mild interstitial edema is possible. These findings are better evaluated on recent CT. Electronically Signed   By: Julian Hy M.D.   On: 05/16/2018 09:32   [place] Ekg Site Rite  Result Date: 05/17/2018 If Site Rite image not attached, placement could not be confirmed due to current cardiac rhythm.   Orson Eva, DO  [facility] Pager [phone]  If 7PM-7AM, please contact night-coverage [email or web address] Password [email or web address] 05/21/2018, 9:57 AM   LOS: 9 days

## 2018-05-21 NOTE — Progress Notes (Signed)
Pt HR consistenly 120s over this shift. Notified Dr. Arbutus Leasat gave order for Metoprolol 2.5mg  IV once. Also notified about Rectal temp 100.6 will place pt on cooling blanket. All other VSS.

## 2018-05-22 ENCOUNTER — Inpatient Hospital Stay (HOSPITAL_COMMUNITY): Payer: PPO

## 2018-05-22 DIAGNOSIS — N179 Acute kidney failure, unspecified: Secondary | ICD-10-CM

## 2018-05-22 DIAGNOSIS — N183 Chronic kidney disease, stage 3 unspecified: Secondary | ICD-10-CM

## 2018-05-22 DIAGNOSIS — E038 Other specified hypothyroidism: Secondary | ICD-10-CM

## 2018-05-22 LAB — BLOOD GAS, ARTERIAL
Acid-base deficit: 2.3 mmol/L — ABNORMAL HIGH (ref 0.0–2.0)
Bicarbonate: 22.3 mmol/L (ref 20.0–28.0)
DRAWN BY: 105551
FIO2: 40
MECHANICAL RATE: 22
O2 SAT: 93.3 %
PEEP: 8 cmH2O
PH ART: 7.349 — AB (ref 7.350–7.450)
RATE: 22 resp/min
VT: 600 mL
pCO2 arterial: 41.8 mmHg (ref 32.0–48.0)
pO2, Arterial: 73.2 mmHg — ABNORMAL LOW (ref 83.0–108.0)

## 2018-05-22 LAB — BASIC METABOLIC PANEL
ANION GAP: 7 (ref 5–15)
BUN: 114 mg/dL — AB (ref 8–23)
CALCIUM: 8.4 mg/dL — AB (ref 8.9–10.3)
CHLORIDE: 118 mmol/L — AB (ref 98–111)
CO2: 23 mmol/L (ref 22–32)
Creatinine, Ser: 1.98 mg/dL — ABNORMAL HIGH (ref 0.44–1.00)
GFR, EST AFRICAN AMERICAN: 28 mL/min — AB (ref >60)
GFR, EST NON AFRICAN AMERICAN: 25 mL/min — AB (ref >60)
GLUCOSE: 198 mg/dL — AB (ref 70–99)
POTASSIUM: 5.3 mmol/L — AB (ref 3.5–5.1)
SODIUM: 148 mmol/L — AB (ref 135–145)

## 2018-05-22 LAB — CBC
HCT: 27.4 % — ABNORMAL LOW (ref 36.0–46.0)
HEMOGLOBIN: 8.3 g/dL — AB (ref 12.0–15.0)
MCH: 27.9 pg (ref 26.0–34.0)
MCHC: 30.3 g/dL (ref 30.0–36.0)
MCV: 92.3 fL (ref 80.0–100.0)
PLATELETS: 128 10*3/uL — AB (ref 150–400)
RBC: 2.97 MIL/uL — ABNORMAL LOW (ref 3.87–5.11)
RDW: 15.1 % (ref 11.5–15.5)
WBC: 25.6 10*3/uL — ABNORMAL HIGH (ref 4.0–10.5)
nRBC: 0.5 % — ABNORMAL HIGH (ref 0.0–0.2)

## 2018-05-22 LAB — GLUCOSE, CAPILLARY
GLUCOSE-CAPILLARY: 185 mg/dL — AB (ref 70–99)
GLUCOSE-CAPILLARY: 208 mg/dL — AB (ref 70–99)
GLUCOSE-CAPILLARY: 197 mg/dL — AB (ref 70–99)
GLUCOSE-CAPILLARY: 216 mg/dL — AB (ref 70–99)
Glucose-Capillary: 179 mg/dL — ABNORMAL HIGH (ref 70–99)

## 2018-05-22 LAB — SODIUM, URINE, RANDOM: Sodium, Ur: 22 mmol/L

## 2018-05-22 LAB — PROCALCITONIN: Procalcitonin: 0.28 ng/mL

## 2018-05-22 LAB — CREATININE, URINE, RANDOM: Creatinine, Urine: 43.08 mg/dL

## 2018-05-22 MED ORDER — DEXTROSE-NACL 5-0.45 % IV SOLN
INTRAVENOUS | Status: DC
  Administered 2018-05-22 (×2): via INTRAVENOUS

## 2018-05-22 MED ORDER — SODIUM CHLORIDE 0.45 % IV SOLN
INTRAVENOUS | Status: DC
  Administered 2018-05-22 – 2018-05-24 (×4): via INTRAVENOUS

## 2018-05-22 MED ORDER — METHYLPREDNISOLONE SODIUM SUCC 125 MG IJ SOLR
60.0000 mg | Freq: Two times a day (BID) | INTRAMUSCULAR | Status: DC
  Administered 2018-05-22 – 2018-05-24 (×4): 60 mg via INTRAVENOUS
  Filled 2018-05-22 (×4): qty 2

## 2018-05-22 NOTE — Progress Notes (Signed)
Patient is beginning to have yellow secretions in subglottic. Small amount doesn't appear to be tube feeding but is hard to tell. Will continue to watch. FiO2 at 45 , saturation 95. Nothing still come up when et tube is suctioned.

## 2018-05-22 NOTE — Progress Notes (Signed)
Subjective: She had fever yesterday.  She has heel ulcers that may be the source but I do not think it is entirely clear.  No other new problems noted except that her renal function is not as good and has gradually deteriorated over the last 48 hours or so.  Objective: Vital signs in last 24 hours: Temp:  [98.5 F (36.9 C)-101 F (38.3 C)] 98.7 F (37.1 C) (11/25 0755) Pulse Rate:  [82-152] 91 (11/25 0530) Resp:  [11-27] 21 (11/25 0530) BP: (84-148)/(38-69) 108/45 (11/25 0530) SpO2:  [92 %-97 %] 93 % (11/25 0530) FiO2 (%):  [40 %-50 %] 40 % (11/25 0415) Weight:  [79.5 kg] 79.5 kg (11/25 0444) Weight change: 3.5 kg Last BM Date: 05/14/18  Intake/Output from previous day: 11/24 0701 - 11/25 0700 In: 2584.4 [I.V.:1542.4; NG/GT:942; IV Piggyback:100] Out: 500 [Urine:450; Emesis/NG output:50]  PHYSICAL EXAM General appearance: Intubated sedated on mechanical ventilation Resp: rhonchi bilaterally Cardio: regular rate and rhythm, S1, S2 normal, no murmur, click, rub or gallop GI: soft, non-tender; bowel sounds normal; no masses,  no organomegaly Extremities: extremities normal, atraumatic, no cyanosis or edema  Lab Results:  Results for orders placed or performed during the hospital encounter of 05/14/2018 (from the past 48 hour(s))  Glucose, capillary     Status: Abnormal   Collection Time: 05/20/18 11:03 AM  Result Value Ref Range   Glucose-Capillary 211 (H) 70 - 99 mg/dL  Glucose, capillary     Status: Abnormal   Collection Time: 05/20/18  4:28 PM  Result Value Ref Range   Glucose-Capillary 191 (H) 70 - 99 mg/dL  Glucose, capillary     Status: Abnormal   Collection Time: 05/20/18  8:16 PM  Result Value Ref Range   Glucose-Capillary 196 (H) 70 - 99 mg/dL   Comment 1 Notify RN    Comment 2 Document in Chart   Glucose, capillary     Status: Abnormal   Collection Time: 05/20/18 11:52 PM  Result Value Ref Range   Glucose-Capillary 162 (H) 70 - 99 mg/dL   Comment 1 Notify RN     Comment 2 Document in Chart   Procalcitonin     Status: None   Collection Time: 05/21/18  4:19 AM  Result Value Ref Range   Procalcitonin 10.81 ng/mL    Comment:        Interpretation: PCT >= 10 ng/mL: Important systemic inflammatory response, almost exclusively due to severe bacterial sepsis or septic shock. (NOTE)       Sepsis PCT Algorithm           Lower Respiratory Tract                                      Infection PCT Algorithm    ----------------------------     ----------------------------         PCT < 0.25 ng/mL                PCT < 0.10 ng/mL         Strongly encourage             Strongly discourage   discontinuation of antibiotics    initiation of antibiotics    ----------------------------     -----------------------------       PCT 0.25 - 0.50 ng/mL            PCT 0.10 - 0.25 ng/mL  OR       >80% decrease in PCT            Discourage initiation of                                            antibiotics      Encourage discontinuation           of antibiotics    ----------------------------     -----------------------------         PCT >= 0.50 ng/mL              PCT 0.26 - 0.50 ng/mL                AND       <80% decrease in PCT             Encourage initiation of                                             antibiotics       Encourage continuation           of antibiotics    ----------------------------     -----------------------------        PCT >= 0.50 ng/mL                  PCT > 0.50 ng/mL               AND         increase in PCT                  Strongly encourage                                      initiation of antibiotics    Strongly encourage escalation           of antibiotics                                     -----------------------------                                           PCT <= 0.25 ng/mL                                                 OR                                        > 80% decrease in PCT                                      Discontinue / Do not initiate  antibiotics Performed at [facility], [address], [place], [place] [address]   Basic metabolic panel     Status: Abnormal   Collection Time: 05/21/18  4:19 AM  Result Value Ref Range   Sodium 148 (H) 135 - 145 mmol/L   Potassium 5.3 (H) 3.5 - 5.1 mmol/L   Chloride 117 (H) 98 - 111 mmol/L   CO2 25 22 - 32 mmol/L   Glucose, Bld 164 (H) 70 - 99 mg/dL   BUN 88 (H) 8 - 23 mg/dL   Creatinine, Ser 1.53 (H) 0.44 - 1.00 mg/dL   Calcium 8.6 (L) 8.9 - 10.3 mg/dL   GFR calc non Af Amer 34 (L) >60 mL/min   GFR calc Af Amer 39 (L) >60 mL/min    Comment: (NOTE) The eGFR has been calculated using the CKD EPI equation. This calculation has not been validated in all clinical situations. eGFR's persistently <60 mL/min signify possible Chronic Kidney Disease.    Anion gap 6 5 - 15    Comment: Performed at [facility], [address] [place], [place] [address]  CBC with Differential/Platelet     Status: Abnormal   Collection Time: 05/21/18  4:19 AM  Result Value Ref Range   WBC 21.2 (H) 4.0 - 10.5 K/uL   RBC 2.84 (L) 3.87 - 5.11 MIL/uL   Hemoglobin 8.1 (L) 12.0 - 15.0 g/dL   HCT 26.5 (L) 36.0 - 46.0 %   MCV 93.3 80.0 - 100.0 fL   MCH 28.5 26.0 - 34.0 pg   MCHC 30.6 30.0 - 36.0 g/dL   RDW 14.7 11.5 - 15.5 %   Platelets 132 (L) 150 - 400 K/uL   nRBC 0.4 (H) 0.0 - 0.2 %   Neutrophils Relative % 82 %   Neutro Abs 17.4 (H) 1.7 - 7.7 K/uL   Lymphocytes Relative 7 %   Lymphs Abs 1.5 0.7 - 4.0 K/uL   Monocytes Relative 5 %   Monocytes Absolute 1.0 0.1 - 1.0 K/uL   Eosinophils Relative 0 %   Eosinophils Absolute 0.0 0.0 - 0.5 K/uL   Basophils Relative 0 %   Basophils Absolute 0.0 0.0 - 0.1 K/uL   WBC Morphology      MODERATE LEFT SHIFT (>5% METAS AND MYELOS,OCC PRO NOTED)   Immature Granulocytes 6 %   Abs Immature Granulocytes 1.21 (H) 0.00 - 0.07 K/uL   Polychromasia PRESENT     Comment: Performed at  [facility], [address], [place], [place] [address]  Blood gas, arterial     Status: None   Collection Time: 05/21/18  4:25 AM  Result Value Ref Range   FIO2 55.00    Delivery systems VENTILATOR    Mode PRESSURE REGULATED VOLUME CONTROL    VT 600 mL   LHR 22 resp/min   Peep/cpap 8.0 cm H20   pH, Arterial 7.375 7.350 - 7.450   pCO2 arterial 43.5 32.0 - 48.0 mmHg   pO2, Arterial 89.8 83.0 - 108.0 mmHg   Bicarbonate 24.5 20.0 - 28.0 mmol/L   Acid-Base Excess 0.3 0.0 - 2.0 mmol/L   O2 Saturation 96.5 %   Collection site RADIAL    Drawn by [id number]    Sample type ARTERIAL    Allens test (pass/fail) PASS PASS   Mechanical Rate 22     Comment: Performed at [facility], [address], [place], [place] [address]  Glucose, capillary     Status: Abnormal   Collection Time: 05/21/18  4:57 AM  Result Value Ref Range   Glucose-Capillary 184 (H) 70 - 99 mg/dL  Glucose, capillary     Status: Abnormal   Collection Time: 05/21/18  7:23 AM  Result Value Ref Range   Glucose-Capillary 167 (H) 70 - 99 mg/dL  Glucose, capillary     Status: Abnormal   Collection Time: 05/21/18  4:36 PM  Result Value Ref Range   Glucose-Capillary 184 (H) 70 - 99 mg/dL  Glucose, capillary     Status: Abnormal   Collection Time: 05/21/18  8:04 PM  Result Value Ref Range   Glucose-Capillary 202 (H) 70 - 99 mg/dL   Comment 1 Notify RN   Glucose, capillary     Status: Abnormal   Collection Time: 05/21/18 11:38 PM  Result Value Ref Range   Glucose-Capillary 180 (H) 70 - 99 mg/dL  Blood gas, arterial     Status: Abnormal   Collection Time: 05/22/18  4:15 AM  Result Value Ref Range   FIO2 40.00    Delivery systems VENTILATOR    Mode PRESSURE REGULATED VOLUME CONTROL    VT 600 mL   LHR 22 resp/min   Peep/cpap 8.0 cm H20   pH, Arterial 7.349 (L) 7.350 - 7.450   pCO2 arterial 41.8 32.0 - 48.0 mmHg   pO2, Arterial 73.2 (L) 83.0 - 108.0 mmHg   Bicarbonate 22.3 20.0 - 28.0 mmol/L   Acid-base deficit 2.3 (H) 0.0 -  2.0 mmol/L   O2 Saturation 93.3 %   Collection site RADIAL    Drawn by [id number]    Sample type ARTERIAL    Mechanical Rate 22     Comment: Performed at [facility], [address] [place], [place] [address]  Basic metabolic panel     Status: Abnormal   Collection Time: 05/22/18  4:24 AM  Result Value Ref Range   Sodium 148 (H) 135 - 145 mmol/L   Potassium 5.3 (H) 3.5 - 5.1 mmol/L   Chloride 118 (H) 98 - 111 mmol/L   CO2 23 22 - 32 mmol/L   Glucose, Bld 198 (H) 70 - 99 mg/dL   BUN 114 (H) 8 - 23 mg/dL    Comment: RESULTS CONFIRMED BY MANUAL DILUTION   Creatinine, Ser 1.98 (H) 0.44 - 1.00 mg/dL   Calcium 8.4 (L) 8.9 - 10.3 mg/dL   GFR calc non Af Amer 25 (L) >60 mL/min   GFR calc Af Amer 28 (L) >60 mL/min    Comment: (NOTE) The eGFR has been calculated using the CKD EPI equation. This calculation has not been validated in all clinical situations. eGFR's persistently <60 mL/min signify possible Chronic Kidney Disease.    Anion gap 7 5 - 15    Comment: Performed at [facility], [address], [place], [place] [address]  Procalcitonin     Status: None   Collection Time: 05/22/18  4:24 AM  Result Value Ref Range   Procalcitonin 0.28 ng/mL    Comment:        Interpretation: PCT (Procalcitonin) <= 0.5 ng/mL: Systemic infection (sepsis) is not likely. Local bacterial infection is possible. (NOTE)       Sepsis PCT Algorithm           Lower Respiratory Tract                                      Infection PCT Algorithm    ----------------------------     ----------------------------  PCT < 0.25 ng/mL                PCT < 0.10 ng/mL         Strongly encourage             Strongly discourage   discontinuation of antibiotics    initiation of antibiotics    ----------------------------     -----------------------------       PCT 0.25 - 0.50 ng/mL            PCT 0.10 - 0.25 ng/mL               OR       >80% decrease in PCT            Discourage initiation of                                             antibiotics      Encourage discontinuation           of antibiotics    ----------------------------     -----------------------------         PCT >= 0.50 ng/mL              PCT 0.26 - 0.50 ng/mL               AND        <80% decrease in PCT             Encourage initiation of                                             antibiotics       Encourage continuation           of antibiotics    ----------------------------     -----------------------------        PCT >= 0.50 ng/mL                  PCT > 0.50 ng/mL               AND         increase in PCT                  Strongly encourage                                      initiation of antibiotics    Strongly encourage escalation           of antibiotics                                     -----------------------------                                           PCT <= 0.25 ng/mL  OR                                        > 80% decrease in PCT                                     Discontinue / Do not initiate                                             antibiotics Performed at [facility], [address] [place], [place] [address]   Glucose, capillary     Status: Abnormal   Collection Time: 05/22/18  4:58 AM  Result Value Ref Range   Glucose-Capillary 179 (H) 70 - 99 mg/dL  Glucose, capillary     Status: Abnormal   Collection Time: 05/22/18  7:59 AM  Result Value Ref Range   Glucose-Capillary 185 (H) 70 - 99 mg/dL    ABGS Recent Labs    05/22/18 0415  PHART 7.349*  PO2ART 73.2*  HCO3 22.3   CULTURES Recent Results (from the past 240 hour(s))  MRSA PCR Screening     Status: None   Collection Time: 05/15/18  7:17 AM  Result Value Ref Range Status   MRSA by PCR NEGATIVE NEGATIVE Final    Comment:        The GeneXpert MRSA Assay (FDA approved for NASAL specimens only), is one component of a comprehensive MRSA colonization surveillance program. It is  not intended to diagnose MRSA infection nor to guide or monitor treatment for MRSA infections. Performed at [facility], [address], [place], [place] [address]   Respiratory Panel by PCR     Status: Abnormal   Collection Time: 05/17/18  5:45 PM  Result Value Ref Range Status   Adenovirus NOT DETECTED NOT DETECTED Final   Coronavirus 229E NOT DETECTED NOT DETECTED Final   Coronavirus HKU1 NOT DETECTED NOT DETECTED Final   Coronavirus NL63 NOT DETECTED NOT DETECTED Final   Coronavirus OC43 NOT DETECTED NOT DETECTED Final   Metapneumovirus NOT DETECTED NOT DETECTED Final   Rhinovirus / Enterovirus DETECTED (A) NOT DETECTED Final   Influenza A NOT DETECTED NOT DETECTED Final   Influenza B NOT DETECTED NOT DETECTED Final   Parainfluenza Virus 1 NOT DETECTED NOT DETECTED Final   Parainfluenza Virus 2 NOT DETECTED NOT DETECTED Final   Parainfluenza Virus 3 NOT DETECTED NOT DETECTED Final   Parainfluenza Virus 4 NOT DETECTED NOT DETECTED Final   Respiratory Syncytial Virus NOT DETECTED NOT DETECTED Final   Bordetella pertussis NOT DETECTED NOT DETECTED Final   Chlamydophila pneumoniae NOT DETECTED NOT DETECTED Final   Mycoplasma pneumoniae NOT DETECTED NOT DETECTED Final    Comment: Performed at [facility], [place] [address], [place], [place] [address]  Culture, respiratory (non-expectorated)     Status: None   Collection Time: 05/17/18  5:45 PM  Result Value Ref Range Status   Specimen Description   Final    TRACHEAL ASPIRATE Performed at [facility], [address], [place], [place] [address]    Special Requests   Final    NONE Performed at [facility], [address], [place], [place] [address]  Gram Stain   Final    RARE WBC PRESENT, PREDOMINANTLY PMN NO ORGANISMS SEEN Performed at [facility], [place] [address], [place], [place] [address]    Culture FEW CANDIDA ALBICANS  Final   Report Status 05/20/2018 FINAL  Final  Culture, blood (Routine X 2) w Reflex to  ID Panel     Status: None (Preliminary result)   Collection Time: 05/19/18 11:06 AM  Result Value Ref Range Status   Specimen Description BLOOD PICC LINE DRAWN BY RN  Final   Special Requests   Final    BOTTLES DRAWN AEROBIC AND ANAEROBIC Blood Culture adequate volume   Culture   Final    NO GROWTH 3 DAYS Performed at [facility] [address], [place], [place] [address]    Report Status PENDING  Incomplete  Culture, blood (Routine X 2) w Reflex to ID Panel     Status: None (Preliminary result)   Collection Time: 05/19/18 11:09 AM  Result Value Ref Range Status   Specimen Description BLOOD LEFT HAND DRAWN BY RN  Final   Special Requests   Final    BOTTLES DRAWN AEROBIC AND ANAEROBIC Blood Culture results may not be optimal due to an excessive volume of blood received in culture bottles   Culture   Final    NO GROWTH 3 DAYS Performed at [facility], [address] [place], [place] [address]    Report Status PENDING  Incomplete  Culture, Urine     Status: None   Collection Time: 05/19/18 11:45 AM  Result Value Ref Range Status   Specimen Description   Final    URINE, CATHETERIZED Performed at [facility], [address], [place], [place] [address]    Special Requests   Final    NONE Performed at [facility], [address] [place], [place] [address]    Culture   Final    NO GROWTH Performed at [facility], [place] [address] [place], [place] [address]    Report Status 05/21/2018 FINAL  Final   Studies/Results: Dg Chest Port 1 View  Result Date: 05/21/2018 CLINICAL DATA:  Respiratory failure, ventilatory support EXAM: PORTABLE CHEST 1 VIEW COMPARISON:  05/20/2018 FINDINGS: Endotracheal tube 4.4 cm above the carina. Exam is rotated to the right. Right PICC line tip upper SVC level. NG tube enters the proximal stomach. Minimal improvement in asymmetric patchy bilateral airspace disease/pneumonia involving the right upper lobe and both lower lobes. No enlarging effusion  by plain radiography. No large pneumothorax. Overall stable exam compared to yesterday. IMPRESSION: Stable support apparatus Persistent bilateral patchy airspace process, minimal improvement, more compatible with multifocal pneumonia by comparison CT Electronically Signed   By: Jerilynn Mages.  Shick M.D.   On: 05/21/2018 08:50    Medications:  Prior to Admission:  Medications Prior to Admission  Medication Sig Dispense Refill Last Dose  . ALPRAZolam (XANAX) 0.5 MG tablet Take 0.5 mg by mouth daily as needed.    Past Month at Unknown time  . aspirin 81 MG tablet Take 81 mg by mouth every other day.    Past Week at Unknown time  . cetirizine (ZYRTEC ALLERGY) 10 MG tablet Take 10 mg by mouth daily.   Past Week at Unknown time  . doxycycline (VIBRA-TABS) 100 MG tablet Take 100 mg by mouth 2 (two) times daily.   05/10/2018 at 1800  . levothyroxine (SYNTHROID, LEVOTHROID) 50 MCG tablet Take 50 mcg by mouth as directed. Patient takes 14mg on Saturday  and Sunday; all other days she takes 31mg   05/07/2018 at 1000  . levothyroxine (SYNTHROID, LEVOTHROID) 75 MCG tablet Take 75 mcg by mouth as directed. Patient takes 771m every day except Saturday and Sunday; on Saturday and Sunday, she takes 5068m  05/20/2018 at 1000  . lisinopril (PRINIVIL,ZESTRIL) 20 MG tablet TAKE 1/2 TABLET BY MOUTH ONCE DAILY. 45 tablet 3 05/10/2018 at 1800  . pravastatin (PRAVACHOL) 40 MG tablet TAKE ONE TABLET BY MOUTH IN THE EVENING. 90 tablet 2 05/10/2018 at 1800  . Pseudoephedrine-Guaifenesin (MUCINEX D MAX STRENGTH) [phone] MG TB12 Take 1 tablet by mouth daily.   05/10/2018 at Unknown time  . verapamil (VERELAN PM) 240 MG 24 hr capsule TAKE (1) CAPSULE BY MOUTH AT BEDTIME. 30 capsule 2 05/10/2018 at 1800  . zolpidem (AMBIEN) 10 MG tablet Take 10 mg by mouth at bedtime.    05/10/2018 at Unknown time   Scheduled: . aspirin EC  81 mg Oral Q48H  . budesonide (PULMICORT) nebulizer solution  0.5 mg Nebulization BID  . chlorhexidine  gluconate (MEDLINE KIT)  15 mL Mouth Rinse BID  . Chlorhexidine Gluconate Cloth  6 each Topical Daily  . diltiazem  60 mg Oral Q6H  . docusate  100 mg Per Tube BID  . feeding supplement (PRO-STAT SUGAR FREE 64)  30 mL Per Tube Daily  . feeding supplement (VITAL AF 1.2 CAL)  1,000 mL Per Tube Q24H  . fentaNYL (SUBLIMAZE) injection  50 mcg Intravenous Once  . insulin aspart  2-6 Units Subcutaneous Q4H  . ipratropium  0.5 mg Nebulization Q6H  . levalbuterol  1.25 mg Nebulization Q6H  . levothyroxine  37.5 mcg Intravenous Daily  . mouth rinse  15 mL Mouth Rinse 10 times per day  . methylPREDNISolone (SOLU-MEDROL) injection  60 mg Intravenous Q6H  . pantoprazole (PROTONIX) IV  40 mg Intravenous Q24H  . sodium chloride flush  10-40 mL Intracatheter Q12H  . sodium chloride flush  3 mL Intravenous Q12H   Continuous: . sodium chloride Stopped (05/21/18 1100)  . ceFEPime (MAXIPIME) IV Stopped (05/21/18 2213)  . dextrose 5 % and 0.45% NaCl 75 mL/hr at 05/22/18 0809  . fentaNYL infusion INTRAVENOUS 125 mcg/hr (05/22/18 0809)   PRNZOX:WRUEAVloride, acetaminophen **OR** acetaminophen, albuterol, bisacodyl, fentaNYL, fentaNYL (SUBLIMAZE) injection, midazolam, ondansetron **OR** ondansetron (ZOFRAN) IV, sodium chloride flush  Assesment: She has acute hypoxic respiratory failure presumably on the basis of multifocal pneumonia.  Chest x-ray has been improving and today's chest x-ray is pending.  She has fever and the source of that is not totally clear  She has worsening renal function.  She has malnutrition and is receiving tube feedings but still has significant issues that may be causing some third spacing of fluid. 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  She is on 40% now.  I think she can try to breathe on her own and see how she does but she is not ready for extubation    LOS: 10 days   Heylee Tant L 05/22/2018, 8:37 AM

## 2018-05-22 NOTE — Progress Notes (Signed)
Still seeing some yellow in subglottic May be tube feeding ? Might consider cutting tube feeding in half to be sure. No secretions seen in ET tube. Kidney function decreasing. FiO2 to 40 --saturation -- 93-94. Respirations still 25-27. Nothing else noted.

## 2018-05-22 NOTE — Progress Notes (Signed)
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 She had gone to her PCP a few days prior for a week of fevers, chills, increasing cough, and diagnosed with pneumonia, started on doxycycline and nebulized breathing treatments at home but has had no improvement. Over the past 24 hours she's grown increasingly weak, getting up out of bed rarely, much more short of breath when she attempts this and constantly feels like she's about to pass out. On initial evaluation she was afebrile, but was 100.7Fat home prior to taking tylenol. Tachycardic with soft BP, hypoxic to 88% on room air, improved with 2L O2, tachypneic. WBC 11.3k, flu negative, lactic acid elevated. Levaquin and IV fluids given after blood cultures drawn.She continued to be hypoxic and developed wheezing for which steroids and breathing treatments were provided. Respiratory effort increased and hypoxia worsened prompting pulmonology consultation and transfer to SDU/ICU on 11/18.BiPAP started with improvement in oxygenation. The patient did not show any improvement on BiPAP. She was intubated on 05/17/18.  Assessment/Plan: Acute hypoxic respiratory failure: -Due to multifocal pneumonia, bronchospasmandan element of interstitial edema. - Continue broad antimicrobial coverage, -Stopped vancomycin as she failed an adequate trial of doxycycline, MRSA negative. -decreasesolumedrol to 60 IV q 6 - Intubated 05/17/18 -received lasix 11/18,11/19, 11/21 11/25-repeat CXR--personally reviewed--slight improvement RLL, LLL opacities; chronic interstitial markings improving interstitial markings -FiO2 down to 0.45;  -weaning per  pulmonary  Sepsis due to multilobar pneumonia: -Flu negative. -RVP = rhino/entervirus - Continuecefepime -PCT declining 0.49>>>0.34>>>10.81>>>0.28 -tracheal aspirate for culture--C.albicans-->unclear clinicaly significance -continues running low grade fever -repeat blood cultureneg -repeat UA without pyuria -d/c levofloxacin -discontinue cefepime--finished 7 days  COPD Exacerbation -pt has at least 20 pack year history of tobacco -continuesolumedrol--decrease to q 12 hours -conitinuepulmicort -continue bronchodilators  FEN--Hyperkalemia/Hypernatremia -placedOG -continueenteral feeding--tolerating -check mag--2.6 -check phos--4.8 -start 1/2NS  HTN: - Continue to hold lisinopril -continue diltiazem  Tachycardia/atrial tachycardia -questionintermittentatrial tachycardia/SVT -Troponin negative -EKG--sinus--nonspecific T wave changes -change albuterol to xopenex -increase dilitazem 60 mg po q6 (on verapamil at home) -05/15/18 Echo--EF 55-60%, no WMA, normal RV -personally reviewed EKG--sinus/atrial tachycardia, no STT changes  Hypothyroidism:  - Continue home variable synthroid dosing. -change synthroid to IV  Thrombocytosis>>>>Thrombocytopenia -d/c lovenox -SCDs  Leukocytosis: -Difficult to distinguish between infection vs. steroid-induced.  - Continue monitoring. -pt remains afebrileand hemodynamically stable -left shift present  Acute on chronic renal failureStage III CKD: -Creatinine at baseline on admission.Had lasix, vancomycin, and contrast  -also due to sepsis and hemodynamic changes -baseline creatinine 0.8-1.1 -serum creatinine up due to furosemide -renal ultrasound  Carotid stenosis: -Continue QOD ASA.  Hyperlipidemia: -Continue statin  Left Heel Pressure Injury -not present during admission -place prevalon boots    Disposition Plan:remain inICU Family Communication: Family at bedside  updated11/25  Consultants:pulmonary  Code Status: FULL   DVT Prophylaxis:[place] Lovenox   Procedures: As Listed in Progress Note Above  Antibiotics: vanco 11/18 levoflox 11/14>>>11/22 Cefepime 11/18>>>11/24   The patient is critically ill with multiple organ systems failure and requires high complexity decision making for assessment and support, frequent evaluation and titration of therapies, application of advanced monitoring technologies and extensive interpretation of multiple databases.  Critical care time -35 mins.      Subjective:  Pt sedated on ventilator sedated.  No respiratory distress. No vomiting or diarrhea.  Tolerating TF  Objective: Vitals:   05/22/18 0755 05/22/18 0845 05/22/18 0853 05/22/18 0900  BP:      Pulse:      Resp:      Temp: 98.7 F (37.1 C)     TempSrc: Rectal     SpO2:  95% 94% 96%  Weight:      Height:    5' (1.524 m)    Intake/Output Summary (Last 24 hours) at 05/22/2018 1105 Last data filed at 05/22/2018 0949 Gross per 24 hour  Intake 2959.35 ml  Output 500 ml  Net 2459.35 ml   Weight change: 3.5 kg Exam:   General:  Pt is alert, but sedated on vent  HEENT: No icterus, No thrush, No neck mass, [place]/AT  Cardiovascular: RRR, S1/S2, no rubs, no gallops  Respiratory: bibasilar rales, no wheeze  Abdomen: Soft/+BS, non tender, non distended, no guarding  Extremities: trace LE edema, No lymphangitis, No petechiae, No rashes, no synovitis   Data Reviewed: I have personally reviewed following labs and imaging studies Basic Metabolic Panel: Recent Labs  Lab 05/17/18 1026 05/18/18 0356 05/19/18 0533 05/20/18 0530 05/21/18 0419 05/22/18 0424  NA  --  141 143 145 148* 148*  K  --  4.3 4.8 4.8 5.3* 5.3*  CL  --  111 112* 115* 117* 118*  CO2  --  _0 GLUCOSE  --  195* 182* 187* 164* 198*  BUN  --  32* 56* 70* 88* 114*  CREATININE  --  1.21* 1.43* 1.39* 1.53* 1.98*  CALCIUM  --  8.6* 8.6* 8.4* 8.6*  8.4*  MG 2.1  --  2.6*  --   --   --   PHOS 4.0 3.7 4.8*  --   --   --    Liver Function Tests: Recent Labs  Lab 05/18/18 0356 05/20/18 0530  AST 21 17  ALT 22 16  ALKPHOS 87 71  BILITOT 0.5 0.5  PROT 5.9* 5.7*  ALBUMIN 1.9* 1.8*   No results for input(s): LIPASE, AMYLASE in the last 168 hours. No results for input(s): AMMONIA in the last 168 hours. Coagulation Profile: No results for input(s): INR, PROTIME in the last 168 hours. CBC: Recent Labs  Lab 05/18/18 0356 05/19/18 0533 05/20/18 0530 05/21/18 0419 05/22/18 0424  WBC 20.5* 24.2* 22.4* 21.2* 25.6*  NEUTROABS  --  21.6*  --  17.4*  --   HGB 8.8* 9.4* 8.4* 8.1* 8.3*  HCT 28.4* 31.0* 28.0* 26.5* 27.4*  MCV 89.9 93.9 94.0 93.3 92.3  PLT 332 235 162 132* 128*   Cardiac Enzymes: No results for input(s): CKTOTAL, CKMB, CKMBINDEX, TROPONINI in the last 168 hours. BNP: Invalid input(s): POCBNP CBG: Recent Labs  Lab 05/21/18 1636 05/21/18 2004 05/21/18 2338 05/22/18 0458 05/22/18 0759  GLUCAP 184* 202* 180* 179* 185*   HbA1C: No results for input(s): HGBA1C in the last 72 hours. Urine analysis:    Component Value Date/Time   COLORURINE YELLOW 05/19/2018 1145   APPEARANCEUR HAZY (A) 05/19/2018 1145   LABSPEC 1.020 05/19/2018 1145   PHURINE 6.0 05/19/2018 1145   GLUCOSEU NEGATIVE 05/19/2018 1145   HGBUR LARGE (A) 05/19/2018 1145   BILIRUBINUR NEGATIVE 05/19/2018 1145   KETONESUR NEGATIVE 05/19/2018 1145   PROTEINUR 30 (A) 05/19/2018 1145   UROBILINOGEN 0.2 01/07/2010 1316   NITRITE NEGATIVE 05/19/2018 1145   LEUKOCYTESUR NEGATIVE 05/19/2018 1145   Sepsis  Labs: _0 (procalcitonin:4,lacticidven:4) ) Recent Results (from the past 240 hour(s))  MRSA PCR Screening     Status: None   Collection Time: 05/15/18  7:17 AM  Result Value Ref Range Status   MRSA by PCR NEGATIVE NEGATIVE Final    Comment:        The GeneXpert MRSA Assay (FDA approved for NASAL specimens only), is one component of  a comprehensive MRSA colonization surveillance program. It is not intended to diagnose MRSA infection nor to guide or monitor treatment for MRSA infections. Performed at [facility], [address] [place], [place] [address]   Respiratory Panel by PCR     Status: Abnormal   Collection Time: 05/17/18  5:45 PM  Result Value Ref Range Status   Adenovirus NOT DETECTED NOT DETECTED Final   Coronavirus 229E NOT DETECTED NOT DETECTED Final   Coronavirus HKU1 NOT DETECTED NOT DETECTED Final   Coronavirus NL63 NOT DETECTED NOT DETECTED Final   Coronavirus OC43 NOT DETECTED NOT DETECTED Final   Metapneumovirus NOT DETECTED NOT DETECTED Final   Rhinovirus / Enterovirus DETECTED (A) NOT DETECTED Final   Influenza A NOT DETECTED NOT DETECTED Final   Influenza B NOT DETECTED NOT DETECTED Final   Parainfluenza Virus 1 NOT DETECTED NOT DETECTED Final   Parainfluenza Virus 2 NOT DETECTED NOT DETECTED Final   Parainfluenza Virus 3 NOT DETECTED NOT DETECTED Final   Parainfluenza Virus 4 NOT DETECTED NOT DETECTED Final   Respiratory Syncytial Virus NOT DETECTED NOT DETECTED Final   Bordetella pertussis NOT DETECTED NOT DETECTED Final   Chlamydophila pneumoniae NOT DETECTED NOT DETECTED Final   Mycoplasma pneumoniae NOT DETECTED NOT DETECTED Final    Comment: Performed at [facility], [place] [address], [place], [place] [address]  Culture, respiratory (non-expectorated)     Status: None   Collection Time: 05/17/18  5:45 PM  Result Value Ref Range Status   Specimen Description   Final    TRACHEAL ASPIRATE Performed at [facility], [address] [place], [place] [address]    Special Requests   Final    NONE Performed at [facility], [address] [place], [place] [address]    Gram Stain   Final    RARE WBC PRESENT, PREDOMINANTLY PMN NO ORGANISMS SEEN Performed at [facility] [address], [place], [place] [address]    Culture FEW CANDIDA ALBICANS  Final   Report Status  05/20/2018 FINAL  Final  Culture, blood (Routine X 2) w Reflex to ID Panel     Status: None (Preliminary result)   Collection Time: 05/19/18 11:06 AM  Result Value Ref Range Status   Specimen Description BLOOD PICC LINE DRAWN BY RN  Final   Special Requests   Final    BOTTLES DRAWN AEROBIC AND ANAEROBIC Blood Culture adequate volume   Culture   Final    NO GROWTH 3 DAYS Performed at [facility], [address], [place],  [address]    Report Status PENDING  Incomplete  Culture, blood (Routine X 2) w Reflex to ID Panel     Status: None (Preliminary result)   Collection Time: 05/19/18 11:09 AM  Result Value Ref Range Status   Specimen Description BLOOD LEFT HAND DRAWN BY RN  Final   Special Requests   Final    BOTTLES DRAWN AEROBIC AND ANAEROBIC Blood Culture results may not be optimal due to an excessive volume of blood received in culture bottles   Culture   Final  NO GROWTH 3 DAYS Performed at [facility], [address], [place], [place] [address]    Report Status PENDING  Incomplete  Culture, Urine     Status: None   Collection Time: 05/19/18 11:45 AM  Result Value Ref Range Status   Specimen Description   Final    URINE, CATHETERIZED Performed at [facility], [address] [place], [place] [address]    Special Requests   Final    NONE Performed at [facility], [address], [place], [place] [address]    Culture   Final    NO GROWTH Performed at [facility], [place] [address] [place], [place] [address]    Report Status 05/21/2018 FINAL  Final     Scheduled Meds: . aspirin EC  81 mg Oral Q48H  . budesonide (PULMICORT) nebulizer solution  0.5 mg Nebulization BID  . chlorhexidine gluconate (MEDLINE KIT)  15 mL Mouth Rinse BID  . Chlorhexidine Gluconate Cloth  6 each Topical Daily  . diltiazem  60 mg Oral Q6H  . docusate  100 mg Per Tube BID  . feeding supplement (PRO-STAT SUGAR FREE 64)  30 mL Per Tube Daily  . feeding supplement (VITAL AF 1.2 CAL)   1,000 mL Per Tube Q24H  . fentaNYL (SUBLIMAZE) injection  50 mcg Intravenous Once  . insulin aspart  2-6 Units Subcutaneous Q4H  . ipratropium  0.5 mg Nebulization Q6H  . levalbuterol  1.25 mg Nebulization Q6H  . levothyroxine  37.5 mcg Intravenous Daily  . mouth rinse  15 mL Mouth Rinse 10 times per day  . methylPREDNISolone (SOLU-MEDROL) injection  60 mg Intravenous Q6H  . pantoprazole (PROTONIX) IV  40 mg Intravenous Q24H  . sodium chloride flush  10-40 mL Intracatheter Q12H  . sodium chloride flush  3 mL Intravenous Q12H   Continuous Infusions: . sodium chloride Stopped (05/21/18 1100)  . ceFEPime (MAXIPIME) IV Stopped (05/21/18 2213)  . dextrose 5 % and 0.45% NaCl 75 mL/hr at 05/22/18 0949  . fentaNYL infusion INTRAVENOUS 50 mcg/hr (05/22/18 0949)    Procedures/Studies: Dg Chest 2 View  Result Date: 05/15/2018 CLINICAL DATA:  Recent pneumonia with persistent cough, fever, and shortness of breath. EXAM: CHEST - 2 VIEW COMPARISON:  May 08, 2018 FINDINGS: Extensive airspace disease is noted throughout much of the right upper and lower lobe regions. There is also patchy infiltrate in the left lower lobe. The degree of consolidation on the right is stable. There is slight the increased consolidation in the left base. Heart size and pulmonary vascularity are normal. No adenopathy. No bone lesions. IMPRESSION: Multifocal pneumonia. Areas of airspace opacity in the right upper and lower lobes appears essentially stable compared to 3 days prior. Increase in infiltrate left base. Stable cardiac silhouette. No adenopathy evident. Electronically Signed   By: Lowella Grip III M.D.   On: 05/04/2018 12:57   Dg Chest 2 View  Result Date: 05/08/2018 CLINICAL DATA:  Productive cough and fever for a couple weeks, former smoker, history hypertension, cardiomyopathy EXAM: CHEST - 2 VIEW COMPARISON:  None FINDINGS: Normal heart size, mediastinal contours, and pulmonary vascularity. Scattered  opacities throughout the RIGHT lung likely representing pneumonia. However these are somewhat patchy/nodular in appearance and require follow-up until resolution to exclude underlying abnormalities including tumor. Minimal atelectasis versus consolidation in LEFT lower lobe. Remaining LEFT lung clear. Elevation of RIGHT diaphragm. No pleural effusion or pneumothorax. IMPRESSION: Predominately RIGHT-sided pulmonary infiltrates consistent with pneumonia. Followup PA and lateral chest X-ray  is recommended in 3-4 weeks following trial of antibiotic therapy to ensure resolution and exclude underlying malignancy. These results will be called to the ordering clinician or representative by the Radiologist Assistant, and communication documented in the PACS or zVision Dashboard. Electronically Signed   By: Lavonia Dana M.D.   On: 05/08/2018 11:45   Ct Angio Chest Pe W Or Wo Contrast  Result Date: 05/14/2018 CLINICAL DATA:  Shortness of breath worsening today. Clinical concern for pulmonary embolus. Positive D-dimer. EXAM: CT ANGIOGRAPHY CHEST WITH CONTRAST TECHNIQUE: Multidetector CT imaging of the chest was performed using the standard protocol during bolus administration of intravenous contrast. Multiplanar CT image reconstructions and MIPs were obtained to evaluate the vascular anatomy. CONTRAST:  72m ISOVUE-370 IOPAMIDOL (ISOVUE-370) INJECTION 76% COMPARISON:  None FINDINGS: Cardiovascular: The heart size is normal. No substantial pericardial effusion. No thoracic aortic aneurysm. No filling defect in the opacified pulmonary arteries to suggest the presence of an acute pulmonary embolus. Mediastinum/Nodes: 14 mm short axis subcarinal lymph node is associated with mild bilateral hilar lymphadenopathy. The esophagus has normal imaging features. There is no axillary lymphadenopathy. Lungs/Pleura: The central tracheobronchial airways are patent. Asymmetric patchy airspace disease, right greater than left is associated  with interlobular septal thickening. Confluent airspace disease noted in the right lower lobe posterior left lower lobe. Small to moderate right pleural effusion evident with small left pleural effusion. Upper Abdomen: The liver shows diffusely decreased attenuation suggesting steatosis. Musculoskeletal: No worrisome lytic or sclerotic osseous abnormality. Review of the MIP images confirms the above findings. IMPRESSION: 1. No CT evidence for acute pulmonary embolus. 2. Bilateral patchy, right greater than left airspace disease compatible with multifocal pneumonia. 3. Mild mediastinal and bilateral hilar lymphadenopathy. 4. Small to moderate bilateral pleural effusions.  The Electronically Signed   By: EMisty StanleyM.D.   On: 05/14/2018 17:35   Dg Chest Port 1 View  Result Date: 05/22/2018 CLINICAL DATA:  Respiratory failure EXAM: PORTABLE CHEST 1 VIEW COMPARISON:  05/21/2018 FINDINGS: Cardiac shadow is stable. Endotracheal tube and nasogastric catheter is well as a right-sided PICC line are again seen in stable position. The lungs are well aerated bilaterally. Improved aeration in the left lung base and right upper lobe is noted. Persistent airspace opacity in the right lung base is seen. No sizable effusion is noted. IMPRESSION: Overall improved aeration with some persistent opacity in the right base. Electronically Signed   By: MInez CatalinaM.D.   On: 05/22/2018 09:52   Dg Chest Port 1 View  Result Date: 05/21/2018 CLINICAL DATA:  Respiratory failure, ventilatory support EXAM: PORTABLE CHEST 1 VIEW COMPARISON:  05/20/2018 FINDINGS: Endotracheal tube 4.4 cm above the carina. Exam is rotated to the right. Right PICC line tip upper SVC level. NG tube enters the proximal stomach. Minimal improvement in asymmetric patchy bilateral airspace disease/pneumonia involving the right upper lobe and both lower lobes. No enlarging effusion by plain radiography. No large pneumothorax. Overall stable exam compared to  yesterday. IMPRESSION: Stable support apparatus Persistent bilateral patchy airspace process, minimal improvement, more compatible with multifocal pneumonia by comparison CT Electronically Signed   By: MJerilynn Mages  Shick M.D.   On: 05/21/2018 08:50   Dg Chest Port 1 View  Result Date: 05/20/2018 CLINICAL DATA:  Hypoxia.  Acute respiratory failure EXAM: PORTABLE CHEST 1 VIEW COMPARISON:  Chest radiograph 05/19/2018 FINDINGS: ET tube terminates in the mid trachea. Right upper extremity PICC line tip projects over the superior vena cava. Enteric tube courses inferior to the diaphragm. Monitoring  leads overlie the patient. Stable cardiac and mediastinal contours. Similar-appearing bilateral interstitial pulmonary opacities. No pleural effusion or pneumothorax. IMPRESSION: Support apparatus as above. Similar-appearing interstitial opacities favored to represent edema. Electronically Signed   By: Lovey Newcomer M.D.   On: 05/20/2018 08:58   Dg Chest Port 1 View  Result Date: 05/19/2018 CLINICAL DATA:  Nasogastric tube placement. EXAM: PORTABLE CHEST 1 VIEW COMPARISON:  Radiograph of May 18, 2018. FINDINGS: Stable cardiomediastinal silhouette. Endotracheal and nasogastric tubes are unchanged in position. Right-sided PICC line is unchanged in position. No pneumothorax is noted. Mildly improved bilateral lung opacities are noted most consistent with improving edema. Small pleural effusions may be present. Bony thorax is unremarkable. IMPRESSION: Stable support apparatus. Probable mildly improved bilateral pulmonary edema. Electronically Signed   By: Marijo Conception, M.D.   On: 05/19/2018 10:23   Dg Chest Port 1 View  Result Date: 05/18/2018 CLINICAL DATA:  Recent pneumonia EXAM: PORTABLE CHEST 1 VIEW COMPARISON:  Yesterday FINDINGS: Endotracheal tube tip at the clavicular heads. An orogastric tube reaches the stomach at least. Right upper extremity PICC in the interim with tip at the SVC. Extensive interstitial and  airspace opacity with a patchy appearance. No visible effusion or pneumothorax. Normal heart size. Artifact from EKG leads. IMPRESSION: 1. Unremarkable hardware positioning. 2. Multifocal pneumonia with possible edema. Electronically Signed   By: Monte Fantasia M.D.   On: 05/18/2018 07:18   Portable Chest X-ray  Result Date: 05/17/2018 CLINICAL DATA:  Evaluate endotracheal tube placement EXAM: PORTABLE CHEST 1 VIEW COMPARISON:  Portable chest x-ray of 05/17/2017 FINDINGS: Endotracheal tube has now been placed with the tip approximately 2.7 cm above the carina. There is little change in patchy lung opacities greatest in the right mid and upper lung which appears most typical of pneumonia. However as noted on the prior dictation, superimposed edema cannot be excluded. Mild cardiomegaly is stable. IMPRESSION: 1. Tip of endotracheal tube 2.7 cm above the carina. 2. Little change in lung opacities as noted previously suspicious for pneumonia and possibly superimposed edema. Electronically Signed   By: Ivar Drape M.D.   On: 05/17/2018 10:12   Dg Chest Port 1 View  Result Date: 05/17/2018 CLINICAL DATA:  Respiratory failure EXAM: PORTABLE CHEST 1 VIEW COMPARISON:  05/16/2018 FINDINGS: Right upper lobe and left lower lobe opacities, suspicious for pneumonia. Superimposed interstitial opacities, suspicious for interstitial edema. No definite pleural effusions. The heart is normal in size. No pneumothorax. IMPRESSION: Multifocal opacities, right upper lobe predominant, suspicious for pneumonia. Superimposed interstitial edema is also suspected. Overall appearance is similar to the prior. Electronically Signed   By: Julian Hy M.D.   On: 05/17/2018 09:12   Dg Chest Port 1 View  Result Date: 05/16/2018 CLINICAL DATA:  Respiratory failure EXAM: PORTABLE CHEST 1 VIEW COMPARISON:  CTA chest dated 05/14/2018 FINDINGS: Multifocal patchy opacities, right upper lobe predominant, suspicious for pneumonia.  Superimposed mild interstitial edema is possible. No definite pleural effusion. No pneumothorax. The heart is normal in size. IMPRESSION: Multifocal patchy opacities, right upper lobe predominant, suspicious for pneumonia. Superimposed mild interstitial edema is possible. These findings are better evaluated on recent CT. Electronically Signed   By: Julian Hy M.D.   On: 05/16/2018 09:32   [place] Ekg Site Rite  Result Date: 05/17/2018 If Site Rite image not attached, placement could not be confirmed due to current cardiac rhythm.   Orson Eva, DO  [facility] Pager [phone]  If 7PM-7AM, please contact night-coverage [email or web address] Password [facility] 05/22/2018,  11:05 AM   LOS: 10 days

## 2018-05-23 ENCOUNTER — Inpatient Hospital Stay (HOSPITAL_COMMUNITY): Payer: PPO

## 2018-05-23 ENCOUNTER — Encounter (HOSPITAL_COMMUNITY): Payer: Self-pay | Admitting: Primary Care

## 2018-05-23 DIAGNOSIS — J9601 Acute respiratory failure with hypoxia: Secondary | ICD-10-CM

## 2018-05-23 DIAGNOSIS — Z515 Encounter for palliative care: Secondary | ICD-10-CM

## 2018-05-23 DIAGNOSIS — Z7189 Other specified counseling: Secondary | ICD-10-CM

## 2018-05-23 LAB — BLOOD GAS, ARTERIAL
Acid-base deficit: 4.3 mmol/L — ABNORMAL HIGH (ref 0.0–2.0)
Acid-base deficit: 4.3 mmol/L — ABNORMAL HIGH (ref 0.0–2.0)
BICARBONATE: 20.9 mmol/L (ref 20.0–28.0)
Bicarbonate: 20.8 mmol/L (ref 20.0–28.0)
DRAWN BY: 382351
DRAWN BY: 27733
FIO2: 45
FIO2: 0.45
MECHVT: 600 mL
MODE: POSITIVE
O2 Saturation: 95.9 %
O2 Saturation: 93.3 %
PATIENT TEMPERATURE: 37
PCO2 ART: 37.8 mmHg (ref 32.0–48.0)
PEEP: 5 cmH2O
PEEP: 5 cmH2O
PO2 ART: 84.8 mmHg (ref 83.0–108.0)
Patient temperature: 37
Pressure support: 5 cmH2O
RATE: 22 resp/min
pCO2 arterial: 35.3 mmHg (ref 32.0–48.0)
pH, Arterial: 7.351 (ref 7.350–7.450)
pH, Arterial: 7.373 (ref 7.350–7.450)
pO2, Arterial: 69.6 mmHg — ABNORMAL LOW (ref 83.0–108.0)

## 2018-05-23 LAB — GLUCOSE, CAPILLARY
GLUCOSE-CAPILLARY: 140 mg/dL — AB (ref 70–99)
GLUCOSE-CAPILLARY: 155 mg/dL — AB (ref 70–99)
GLUCOSE-CAPILLARY: 207 mg/dL — AB (ref 70–99)
Glucose-Capillary: 149 mg/dL — ABNORMAL HIGH (ref 70–99)
Glucose-Capillary: 160 mg/dL — ABNORMAL HIGH (ref 70–99)
Glucose-Capillary: 197 mg/dL — ABNORMAL HIGH (ref 70–99)

## 2018-05-23 LAB — CBC
HEMATOCRIT: 27.9 % — AB (ref 36.0–46.0)
HEMOGLOBIN: 8.8 g/dL — AB (ref 12.0–15.0)
MCH: 28.9 pg (ref 26.0–34.0)
MCHC: 31.5 g/dL (ref 30.0–36.0)
MCV: 91.8 fL (ref 80.0–100.0)
Platelets: 136 10*3/uL — ABNORMAL LOW (ref 150–400)
RBC: 3.04 MIL/uL — ABNORMAL LOW (ref 3.87–5.11)
RDW: 14.9 % (ref 11.5–15.5)
WBC: 44.4 10*3/uL — ABNORMAL HIGH (ref 4.0–10.5)
nRBC: 1 % — ABNORMAL HIGH (ref 0.0–0.2)

## 2018-05-23 LAB — BASIC METABOLIC PANEL
Anion gap: 8 (ref 5–15)
BUN: 130 mg/dL — AB (ref 8–23)
CO2: 20 mmol/L — ABNORMAL LOW (ref 22–32)
CREATININE: 2.21 mg/dL — AB (ref 0.44–1.00)
Calcium: 8.3 mg/dL — ABNORMAL LOW (ref 8.9–10.3)
Chloride: 117 mmol/L — ABNORMAL HIGH (ref 98–111)
GFR calc Af Amer: 26 mL/min — ABNORMAL LOW (ref >60)
GFR, EST NON AFRICAN AMERICAN: 22 mL/min — AB (ref >60)
GLUCOSE: 203 mg/dL — AB (ref 70–99)
Potassium: 5.9 mmol/L — ABNORMAL HIGH (ref 3.5–5.1)
SODIUM: 145 mmol/L (ref 135–145)

## 2018-05-23 LAB — PROCALCITONIN: PROCALCITONIN: 0.26 ng/mL

## 2018-05-23 MED ORDER — METOPROLOL TARTRATE 5 MG/5ML IV SOLN
5.0000 mg | Freq: Once | INTRAVENOUS | Status: AC
  Administered 2018-05-23: 5 mg via INTRAVENOUS
  Filled 2018-05-23: qty 5

## 2018-05-23 MED ORDER — LACTATED RINGERS IV BOLUS
1000.0000 mL | Freq: Once | INTRAVENOUS | Status: AC
  Administered 2018-05-23: 1000 mL via INTRAVENOUS

## 2018-05-23 MED ORDER — SODIUM POLYSTYRENE SULFONATE 15 GM/60ML PO SUSP
30.0000 g | Freq: Once | ORAL | Status: DC
  Filled 2018-05-23: qty 120

## 2018-05-23 MED ORDER — SODIUM ZIRCONIUM CYCLOSILICATE 10 G PO PACK
10.0000 g | PACK | Freq: Two times a day (BID) | ORAL | Status: DC
  Administered 2018-05-23 – 2018-05-24 (×3): 10 g via ORAL
  Filled 2018-05-23 (×7): qty 1

## 2018-05-23 MED ORDER — METOPROLOL TARTRATE 5 MG/5ML IV SOLN
2.5000 mg | Freq: Four times a day (QID) | INTRAVENOUS | Status: DC
  Administered 2018-05-23 – 2018-05-24 (×4): 2.5 mg via INTRAVENOUS
  Filled 2018-05-23 (×4): qty 5

## 2018-05-23 MED ORDER — ORAL CARE MOUTH RINSE
15.0000 mL | Freq: Two times a day (BID) | OROMUCOSAL | Status: DC
  Administered 2018-05-23 – 2018-05-24 (×2): 15 mL via OROMUCOSAL

## 2018-05-23 NOTE — Procedures (Signed)
**Note De-Identified Yechiel Erny Obfuscation** Extubation Procedure Note  Patient Details:   Name: Jasmine Black DOB: [birth year] MRN: [id number]   Airway Documentation:    Vent end date: 05/23/18 Vent end time: 1144   Evaluation  O2 sats: stable throughout Complications: No apparent complications Patient did tolerate procedure well. Bilateral Breath Sounds: Clear, Diminished   No + leak, no stridor noted, VS and parameters WNL Donia Yokum, Megan SalonWendy Cooper 05/23/2018, 11:46 AM

## 2018-05-23 NOTE — Progress Notes (Signed)
Received call on abnormal result on CT Head. CT head shows possible tiny amount of subarachnoid blood w/in frontal sulcus or possible artifact vs true hemorrhage.  Midlevel notified of results.

## 2018-05-23 NOTE — Progress Notes (Signed)
Subjective: She remains intubated and on mechanical ventilation.  She is now on 45% oxygen her PEEP has been decreased to 5.  She had fever earlier but her T-max is now 99.6.  Renal function was not as good and labs are pending from this morning.  Objective: Vital signs in last 24 hours: Temp:  [98.2 F (36.8 C)-99.6 F (37.6 C)] 99 F (37.2 C) (11/26 0400) Pulse Rate:  [93-145] 107 (11/26 0700) Resp:  [11-40] 24 (11/26 0700) BP: (110-153)/(44-67) 119/54 (11/26 0700) SpO2:  [91 %-96 %] 94 % (11/26 0700) FiO2 (%):  [40 %-45 %] 45 % (11/26 0305) Weight:  [79.5 kg] 79.5 kg (11/26 0500) Weight change: 0 kg Last BM Date: 05/14/18  Intake/Output from previous day: 11/25 0701 - 11/26 0700 In: 1694.3 [I.V.:1261.6; NG/GT:432.7] Out: 1550 [Urine:1550]  PHYSICAL EXAM General appearance: Intubated sedated on mechanical ventilation Resp: rhonchi bilaterally Cardio: Her heart is regular mostly with occasional extra beat GI: soft, non-tender; bowel sounds normal; no masses,  no organomegaly Extremities: She has some generalized puffiness  Lab Results:  Results for orders placed or performed during the hospital encounter of 05/22/2018 (from the past 48 hour(s))  Glucose, capillary     Status: Abnormal   Collection Time: 05/21/18  4:36 PM  Result Value Ref Range   Glucose-Capillary 184 (H) 70 - 99 mg/dL  Glucose, capillary     Status: Abnormal   Collection Time: 05/21/18  8:04 PM  Result Value Ref Range   Glucose-Capillary 202 (H) 70 - 99 mg/dL   Comment 1 Notify RN   Glucose, capillary     Status: Abnormal   Collection Time: 05/21/18 11:38 PM  Result Value Ref Range   Glucose-Capillary 180 (H) 70 - 99 mg/dL  Blood gas, arterial     Status: Abnormal   Collection Time: 05/22/18  4:15 AM  Result Value Ref Range   FIO2 40.00    Delivery systems VENTILATOR    Mode PRESSURE REGULATED VOLUME CONTROL    VT 600 mL   LHR 22 resp/min   Peep/cpap 8.0 cm H20   pH, Arterial 7.349 (L) 7.350 -  7.450   pCO2 arterial 41.8 32.0 - 48.0 mmHg   pO2, Arterial 73.2 (L) 83.0 - 108.0 mmHg   Bicarbonate 22.3 20.0 - 28.0 mmol/L   Acid-base deficit 2.3 (H) 0.0 - 2.0 mmol/L   O2 Saturation 93.3 %   Collection site RADIAL    Drawn by [id number]    Sample type ARTERIAL    Mechanical Rate 22     Comment: Performed at [facility], [address] [place], [place] [address]  Basic metabolic panel     Status: Abnormal   Collection Time: 05/22/18  4:24 AM  Result Value Ref Range   Sodium 148 (H) 135 - 145 mmol/L   Potassium 5.3 (H) 3.5 - 5.1 mmol/L   Chloride 118 (H) 98 - 111 mmol/L   CO2 23 22 - 32 mmol/L   Glucose, Bld 198 (H) 70 - 99 mg/dL   BUN 114 (H) 8 - 23 mg/dL    Comment: RESULTS CONFIRMED BY MANUAL DILUTION   Creatinine, Ser 1.98 (H) 0.44 - 1.00 mg/dL   Calcium 8.4 (L) 8.9 - 10.3 mg/dL   GFR calc non Af Amer 25 (L) >60 mL/min   GFR calc Af Amer 28 (L) >60 mL/min    Comment: (NOTE) The eGFR has been calculated using the CKD EPI equation. This calculation has not been validated in all clinical  situations. eGFR's persistently <60 mL/min signify possible Chronic Kidney Disease.    Anion gap 7 5 - 15    Comment: Performed at [facility], [address], [place], [place] [address]  CBC     Status: Abnormal   Collection Time: 05/22/18  4:24 AM  Result Value Ref Range   WBC 25.6 (H) 4.0 - 10.5 K/uL   RBC 2.97 (L) 3.87 - 5.11 MIL/uL   Hemoglobin 8.3 (L) 12.0 - 15.0 g/dL   HCT 27.4 (L) 36.0 - 46.0 %   MCV 92.3 80.0 - 100.0 fL   MCH 27.9 26.0 - 34.0 pg   MCHC 30.3 30.0 - 36.0 g/dL   RDW 15.1 11.5 - 15.5 %   Platelets 128 (L) 150 - 400 K/uL   nRBC 0.5 (H) 0.0 - 0.2 %    Comment: Performed at [facility], [address], [place],  [address]  Procalcitonin     Status: None   Collection Time: 05/22/18  4:24 AM  Result Value Ref Range   Procalcitonin 0.28 ng/mL    Comment:        Interpretation: PCT (Procalcitonin) <= 0.5 ng/mL: Systemic infection (sepsis) is not  likely. Local bacterial infection is possible. (NOTE)       Sepsis PCT Algorithm           Lower Respiratory Tract                                      Infection PCT Algorithm    ----------------------------     ----------------------------         PCT < 0.25 ng/mL                PCT < 0.10 ng/mL         Strongly encourage             Strongly discourage   discontinuation of antibiotics    initiation of antibiotics    ----------------------------     -----------------------------       PCT 0.25 - 0.50 ng/mL            PCT 0.10 - 0.25 ng/mL               OR       >80% decrease in PCT            Discourage initiation of                                            antibiotics      Encourage discontinuation           of antibiotics    ----------------------------     -----------------------------         PCT >= 0.50 ng/mL              PCT 0.26 - 0.50 ng/mL               AND        <80% decrease in PCT             Encourage initiation of  antibiotics       Encourage continuation           of antibiotics    ----------------------------     -----------------------------        PCT >= 0.50 ng/mL                  PCT > 0.50 ng/mL               AND         increase in PCT                  Strongly encourage                                      initiation of antibiotics    Strongly encourage escalation           of antibiotics                                     -----------------------------                                           PCT <= 0.25 ng/mL                                                 OR                                        > 80% decrease in PCT                                     Discontinue / Do not initiate                                             antibiotics Performed at [facility], [address] [place], [place] [address]   Glucose, capillary     Status: Abnormal   Collection Time: 05/22/18  4:58 AM  Result Value Ref  Range   Glucose-Capillary 179 (H) 70 - 99 mg/dL  Glucose, capillary     Status: Abnormal   Collection Time: 05/22/18  7:59 AM  Result Value Ref Range   Glucose-Capillary 185 (H) 70 - 99 mg/dL  Sodium, urine, random     Status: None   Collection Time: 05/22/18 11:15 AM  Result Value Ref Range   Sodium, Ur 22 mmol/L    Comment: Performed at [facility], [address] [place], [place] [address]  Creatinine, urine, random     Status: None   Collection Time: 05/22/18 11:15 AM  Result Value Ref Range   Creatinine, Urine 43.08 mg/dL    Comment: Performed at [facility], [address], [place], [place] [address]  Glucose, capillary     Status: Abnormal   Collection  Time: 05/22/18 11:47 AM  Result Value Ref Range   Glucose-Capillary 208 (H) 70 - 99 mg/dL  Glucose, capillary     Status: Abnormal   Collection Time: 05/22/18  4:07 PM  Result Value Ref Range   Glucose-Capillary 197 (H) 70 - 99 mg/dL  Glucose, capillary     Status: Abnormal   Collection Time: 05/22/18  8:18 PM  Result Value Ref Range   Glucose-Capillary 216 (H) 70 - 99 mg/dL  Glucose, capillary     Status: Abnormal   Collection Time: 05/23/18 12:01 AM  Result Value Ref Range   Glucose-Capillary 197 (H) 70 - 99 mg/dL  Glucose, capillary     Status: Abnormal   Collection Time: 05/23/18  4:00 AM  Result Value Ref Range   Glucose-Capillary 140 (H) 70 - 99 mg/dL  Procalcitonin     Status: None   Collection Time: 05/23/18  4:12 AM  Result Value Ref Range   Procalcitonin 0.26 ng/mL    Comment:        Interpretation: PCT (Procalcitonin) <= 0.5 ng/mL: Systemic infection (sepsis) is not likely. Local bacterial infection is possible. (NOTE)       Sepsis PCT Algorithm           Lower Respiratory Tract                                      Infection PCT Algorithm    ----------------------------     ----------------------------         PCT < 0.25 ng/mL                PCT < 0.10 ng/mL         Strongly encourage              Strongly discourage   discontinuation of antibiotics    initiation of antibiotics    ----------------------------     -----------------------------       PCT 0.25 - 0.50 ng/mL            PCT 0.10 - 0.25 ng/mL               OR       >80% decrease in PCT            Discourage initiation of                                            antibiotics      Encourage discontinuation           of antibiotics    ----------------------------     -----------------------------         PCT >= 0.50 ng/mL              PCT 0.26 - 0.50 ng/mL               AND        <80% decrease in PCT             Encourage initiation of                                             antibiotics  Encourage continuation           of antibiotics    ----------------------------     -----------------------------        PCT >= 0.50 ng/mL                  PCT > 0.50 ng/mL               AND         increase in PCT                  Strongly encourage                                      initiation of antibiotics    Strongly encourage escalation           of antibiotics                                     -----------------------------                                           PCT <= 0.25 ng/mL                                                 OR                                        > 80% decrease in PCT                                     Discontinue / Do not initiate                                             antibiotics Performed at [facility], [address], [place], [place] [address]   Blood gas, arterial     Status: Abnormal   Collection Time: 05/23/18  5:14 AM  Result Value Ref Range   FIO2 45.00    Delivery systems VENTILATOR    Mode PRESSURE REGULATED VOLUME CONTROL    VT 600 mL   LHR 22 resp/min   Peep/cpap 5.0 cm H20   pH, Arterial 7.351 7.350 - 7.450   pCO2 arterial 37.8 32.0 - 48.0 mmHg   pO2, Arterial 84.8 83.0 - 108.0 mmHg   Bicarbonate 20.8 20.0 - 28.0 mmol/L   Acid-base deficit 4.3 (H) 0.0 - 2.0  mmol/L   O2 Saturation 95.9 %   Patient temperature 37.0    Collection site LEFT RADIAL    Drawn by [id number]    Sample type ARTERIAL DRAW    Allens test (pass/fail) PASS PASS    Comment: Performed at [facility], [address], [place], [place] [address]    [place] Recent Labs    05/23/18 [phone]  PHART 7.351  PO2ART 84.8  HCO3 20.8   CULTURES Recent Results (from the past 240 hour(s))  MRSA PCR Screening     Status: None   Collection Time: 05/15/18  7:17 AM  Result Value Ref Range Status   MRSA by PCR NEGATIVE NEGATIVE Final    Comment:        The GeneXpert MRSA Assay (FDA approved for NASAL specimens only), is one component of a comprehensive MRSA colonization surveillance program. It is not intended to diagnose MRSA infection nor to guide or monitor treatment for MRSA infections. Performed at [facility], [address] [place] [address], [place], [place] [address]   Respiratory Panel by PCR     Status: Abnormal   Collection Time: 05/17/18  5:45 PM  Result Value Ref Range Status   Adenovirus NOT DETECTED NOT DETECTED Final   Coronavirus 229E NOT DETECTED NOT DETECTED Final   Coronavirus HKU1 NOT DETECTED NOT DETECTED Final   Coronavirus NL63 NOT DETECTED NOT DETECTED Final   Coronavirus OC43 NOT DETECTED NOT DETECTED Final   Metapneumovirus NOT DETECTED NOT DETECTED Final   Rhinovirus / Enterovirus DETECTED (A) NOT DETECTED Final   Influenza A NOT DETECTED NOT DETECTED Final   Influenza B NOT DETECTED NOT DETECTED Final   Parainfluenza Virus 1 NOT DETECTED NOT DETECTED Final   Parainfluenza Virus 2 NOT DETECTED NOT DETECTED Final   Parainfluenza Virus 3 NOT DETECTED NOT DETECTED Final   Parainfluenza Virus 4 NOT DETECTED NOT DETECTED Final   Respiratory Syncytial Virus NOT DETECTED NOT DETECTED Final   Bordetella pertussis NOT DETECTED NOT DETECTED Final   Chlamydophila pneumoniae NOT DETECTED NOT DETECTED Final   Mycoplasma pneumoniae NOT DETECTED NOT DETECTED Final    Comment:  Performed at [facility], [place] [address], [place], [place] [address]  Culture, respiratory (non-expectorated)     Status: None   Collection Time: 05/17/18  5:45 PM  Result Value Ref Range Status   Specimen Description   Final    TRACHEAL ASPIRATE Performed at [facility], [address], [place], [place] [address]    Special Requests   Final    NONE Performed at [facility], [address], [place], [place] [address]    Gram Stain   Final    RARE WBC PRESENT, PREDOMINANTLY PMN NO ORGANISMS SEEN Performed at [facility] [address], [place], [place] [address]    Culture FEW CANDIDA ALBICANS  Final   Report Status 05/20/2018 FINAL  Final  Culture, blood (Routine X 2) w Reflex to ID Panel     Status: None (Preliminary result)   Collection Time: 05/19/18 11:06 AM  Result Value Ref Range Status   Specimen Description BLOOD PICC LINE DRAWN BY RN  Final   Special Requests   Final    BOTTLES DRAWN AEROBIC AND ANAEROBIC Blood Culture adequate volume   Culture   Final    NO GROWTH 3 DAYS Performed at [facility], [address] [place], [place] [address]    Report Status PENDING  Incomplete  Culture, blood (Routine X 2) w Reflex to ID Panel     Status: None (Preliminary result)   Collection Time: 05/19/18 11:09 AM  Result Value Ref Range Status   Specimen Description BLOOD LEFT HAND DRAWN BY RN  Final   Special Requests   Final    BOTTLES DRAWN AEROBIC AND ANAEROBIC Blood Culture results may not be optimal due to an excessive volume of blood received in culture  bottles   Culture   Final    NO GROWTH 3 DAYS Performed at [facility], [address] [place], [place] [address]    Report Status PENDING  Incomplete  Culture, Urine     Status: None   Collection Time: 05/19/18 11:45 AM  Result Value Ref Range Status   Specimen Description   Final    URINE, CATHETERIZED Performed at [facility], [address], [place], [place] [address]    Special Requests   Final     NONE Performed at [facility], [address] [place], [place] [address]    Culture   Final    NO GROWTH Performed at [facility], [place] [address] [place],  [address]    Report Status 05/21/2018 FINAL  Final   Studies/Results: US Renal  Result Date: 05/22/2018 CLINICAL DATA:  67 year old female with acute on chronic renal failure. Initial encounter. EXAM: RENAL / URINARY TRACT ULTRASOUND COMPLETE COMPARISON:  None. FINDINGS: Right Kidney: Renal measurements: 8.3 x 3.3 x 3.9 cm = volume: 54.8 mL. Echogenicity within normal limits. Right upper pole 1.1 cm cyst suspected. No hydronephrosis. Small amount of perinephric fluid lower pole region. Left Kidney: Renal measurements: 7.9 x 3.9 x 4.1 cm = volume: 67.3 mL. Echogenicity within normal limits. No left renal mass noted. Small amount of perinephric fluid lower pole region. Bladder: Decompressed by Foley catheter not visualized. Liver of increased echogenicity consistent with fatty infiltration and/or hepatocellular disease. IMPRESSION: 1. No hydronephrosis. 2. Small amount of perinephric fluid lower pole region bilaterally. Etiology indeterminate. 3. Right upper pole 1.1 cm cyst suspected. 4. Bladder not visualized as decompressed with Foley catheter. 5. Liver of increased echogenicity consistent with fatty infiltration and/or hepatocellular disease. Electronically Signed   By: Genia Del M.D.   On: 05/22/2018 14:04   Dg Chest Port 1 View  Result Date: 05/22/2018 CLINICAL DATA:  Respiratory failure EXAM: PORTABLE CHEST 1 VIEW COMPARISON:  05/21/2018 FINDINGS: Cardiac shadow is stable. Endotracheal tube and nasogastric catheter is well as a right-sided PICC line are again seen in stable position. The lungs are well aerated bilaterally. Improved aeration in the left lung base and right upper lobe is noted. Persistent airspace opacity in the right lung base is seen. No sizable effusion is noted. IMPRESSION: Overall improved aeration  with some persistent opacity in the right base. Electronically Signed   By: Inez Catalina M.D.   On: 05/22/2018 09:52    Medications:  Prior to Admission:  Medications Prior to Admission  Medication Sig Dispense Refill Last Dose  . ALPRAZolam (XANAX) 0.5 MG tablet Take 0.5 mg by mouth daily as needed.    Past Month at Unknown time  . aspirin 81 MG tablet Take 81 mg by mouth every other day.    Past Week at Unknown time  . cetirizine (ZYRTEC ALLERGY) 10 MG tablet Take 10 mg by mouth daily.   Past Week at Unknown time  . doxycycline (VIBRA-TABS) 100 MG tablet Take 100 mg by mouth 2 (two) times daily.   05/10/2018 at 1800  . levothyroxine (SYNTHROID, LEVOTHROID) 50 MCG tablet Take 50 mcg by mouth as directed. Patient takes 22mg on Saturday and Sunday; all other days she takes 727m   05/07/2018 at 1000  . levothyroxine (SYNTHROID, LEVOTHROID) 75 MCG tablet Take 75 mcg by mouth as directed. Patient takes 7522mevery day except Saturday and Sunday; on Saturday and Sunday, she takes 60m60m 05/04/2018 at 1000  . lisinopril (PRINIVIL,ZESTRIL)  20 MG tablet TAKE 1/2 TABLET BY MOUTH ONCE DAILY. 45 tablet 3 05/10/2018 at 1800  . pravastatin (PRAVACHOL) 40 MG tablet TAKE ONE TABLET BY MOUTH IN THE EVENING. 90 tablet 2 05/10/2018 at 1800  . Pseudoephedrine-Guaifenesin (MUCINEX D MAX STRENGTH) [phone] MG TB12 Take 1 tablet by mouth daily.   05/10/2018 at Unknown time  . verapamil (VERELAN PM) 240 MG 24 hr capsule TAKE (1) CAPSULE BY MOUTH AT BEDTIME. 30 capsule 2 05/10/2018 at 1800  . zolpidem (AMBIEN) 10 MG tablet Take 10 mg by mouth at bedtime.    05/10/2018 at Unknown time   Scheduled: . aspirin EC  81 mg Oral Q48H  . budesonide (PULMICORT) nebulizer solution  0.5 mg Nebulization BID  . chlorhexidine gluconate (MEDLINE KIT)  15 mL Mouth Rinse BID  . Chlorhexidine Gluconate Cloth  6 each Topical Daily  . diltiazem  60 mg Oral Q6H  . docusate  100 mg Per Tube BID  . feeding supplement (PRO-STAT SUGAR  FREE 64)  30 mL Per Tube Daily  . feeding supplement (VITAL AF 1.2 CAL)  1,000 mL Per Tube Q24H  . fentaNYL (SUBLIMAZE) injection  50 mcg Intravenous Once  . insulin aspart  2-6 Units Subcutaneous Q4H  . ipratropium  0.5 mg Nebulization Q6H  . levalbuterol  1.25 mg Nebulization Q6H  . levothyroxine  37.5 mcg Intravenous Daily  . mouth rinse  15 mL Mouth Rinse 10 times per day  . methylPREDNISolone (SOLU-MEDROL) injection  60 mg Intravenous Q12H  . pantoprazole (PROTONIX) IV  40 mg Intravenous Q24H  . sodium chloride flush  10-40 mL Intracatheter Q12H  . sodium chloride flush  3 mL Intravenous Q12H   Continuous: . sodium chloride 75 mL/hr at 05/23/18 0707  . sodium chloride Stopped (05/21/18 1100)  . fentaNYL infusion INTRAVENOUS 50 mcg/hr (05/23/18 0707)   VPX:TGGYIR chloride, acetaminophen **OR** acetaminophen, albuterol, bisacodyl, fentaNYL, fentaNYL (SUBLIMAZE) injection, midazolam, ondansetron **OR** ondansetron (ZOFRAN) IV, sodium chloride flush  Assesment: She has acute hypoxic respiratory failure that culminated in her being intubated and placed on mechanical ventilation on the 20th.  Since that time her oxygenation has improved markedly from 100% to down now to 45% and her chest x-ray has also improved markedly.  I personally reviewed the chest x-ray this morning and I think aside from technique is not really any different from yesterday.  She has multifocal pneumonia which is improving.  She has malnutrition and is getting tube feedings but still has some generalized puffiness I think from her low albumin  She had fever and it is not clear what that is from.  She has had acute renal failure and had renal ultrasound yesterday that did not show hydronephrosis.  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

## 2018-05-23 NOTE — Consult Note (Signed)
Consultation Note Date: 05/23/2018   Patient Name: Jasmine Black  DOB: [birth year]  MRN: [id number]  Age / Sex: 67 y.o., female  PCP: Elfredia Nevins, MD Referring Physician: Catarina Hartshorn, MD  Reason for Consultation: Establishing goals of care and Psychosocial/spiritual support  HPI/Patient Profile: 67 y.o. 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   Clinical Assessment and Goals of Care: Jasmine Black is lying quietly in bed.  She will look in my general direction but not make eye contact.  She was extubated this morning, and it looks to have some work of breathing.  She appears acutely/chronically ill, and frail.  Present today at bedside is Jasmine Black, and daughter-in-law Jasmine Heck?.  We talked about her acute health problems.  Sister Jasmine Black is very encouraged by extubation stating that they are still "expecting a miracle".  Jasmine Black then goes further to state that she is already a miracle.  I encourage watchful waiting, share that Jasmine Black is still in a guarded condition.  We talked about reintubation.  Daughter-in-law states that husband Jasmine Black has been, considering whether to reintubate.  I share that if he decides no reintubation he needs to convey this to nursing and respiratory, MD if they are here.  Conference with nursing staff and respiratory therapy related to goals of care discussion, respiratory status, family meeting tomorrow. Conference with hospitalist related to acuity of illness, goals of care discussion, family discussion tomorrow.  Family meeting 11/27 at 0900.  HCPOA  NEXT OF KIN -husband, Radiation protection practitioner.   SUMMARY OF RECOMMENDATIONS   Family meeting 11/27 at 0900. Considering whether reintubation would be appropriate  for Jasmine Black. 24 to 48 hours for outcomes.  Code Status/Advance Care Planning:  Full code, at this time.  Symptom Management:   Per hospitalist  Palliative Prophylaxis:   Aspiration, Frequent Pain Assessment and Turn Reposition  Additional Recommendations (Limitations, Scope, Preferences):  Full Scope Treatment  Psycho-social/Spiritual:   Desire for further Chaplaincy support:no  Additional Recommendations: Caregiving  Support/Resources and Education on Hospice  Prognosis:   Unable to determine, based on outcomes  Discharge Planning: to be determined, based on outcomes.       Primary Diagnoses: Present on Admission: . Acute respiratory failure with hypoxia (HCC) . Carotid stenosis . HTN (hypertension) . Hyperlipidemia . Hypothyroidism . 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 Multifocal pneumonia . Sepsis due to pneumonia [facility])   I have reviewed the medical record, interviewed the patient and family, and examined the patient. The following aspects are pertinent.  Past Medical History:  Diagnosis Date  . Arthritis    Osteoprosis  . Chest pain   . Edema   . Hypertension 01/07/2012   Mildly abnormal renal doppler  . 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 PAD (peripheral artery disease) (HCC) 12/2009   right carotid endarterectomy  . Palpitation   .  Renal artery stenosis (HCC)   . 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 Thyroid disease    Social History   Socioeconomic History  . Marital status: Widowed    Spouse name: Not on file  . Number of children: Not on file  . Years of education: Not on file  . Highest education level: Not on file  Occupational History  . Not on file  Social Needs  . Financial resource strain: Not on file  . Food insecurity:    Worry: Not on file    Inability: Not on file  . Transportation needs:    Medical: Not on file     Non-medical: Not on file  Tobacco Use  . Smoking status: Former Smoker    Last attempt to quit: 09/19/2007    Years since quitting: 10.6  . Smokeless tobacco: Never Used  Substance and Sexual Activity  . Alcohol use: No  . Drug use: No  . Sexual activity: Not Currently    Birth control/protection: Post-menopausal  Lifestyle  . Physical activity:    Days per week: Not on file    Minutes per session: Not on file  . Stress: Not on file  Relationships  . Social connections:    Talks on phone: Not on file    Gets together: Not on file    Attends religious service: Not on file    Active member of club or organization: Not on file    Attends meetings of clubs or organizations: Not on file    Relationship status: Not on file  Other Topics Concern  . Not on file  Social History Narrative  . Not on file   Family History  Problem Relation Age of Onset  . Heart disease Mother        Congestive Heart Failure  . Heart attack Father   . Heart disease Sister   . Scoliosis Son    Scheduled Meds: . aspirin EC  81 mg Oral Q48H  . budesonide (PULMICORT) nebulizer solution  0.5 mg Nebulization BID  . Chlorhexidine Gluconate Cloth  6 each Topical Daily  . diltiazem  60 mg Oral Q6H  . docusate  100 mg Per Tube BID  . feeding supplement (PRO-STAT SUGAR FREE 64)  30 mL Per Tube Daily  . insulin aspart  2-6 Units Subcutaneous Q4H  . ipratropium  0.5 mg Nebulization Q6H  . levalbuterol  1.25 mg Nebulization Q6H  . levothyroxine  37.5 mcg Intravenous Daily  . mouth rinse  15 mL Mouth Rinse BID  . methylPREDNISolone (SOLU-MEDROL) injection  60 mg Intravenous Q12H  . metoprolol tartrate  2.5 mg Intravenous Q6H  . pantoprazole (PROTONIX) IV  40 mg Intravenous Q24H  . sodium chloride flush  10-40 mL Intracatheter Q12H  . sodium chloride flush  3 mL Intravenous Q12H   Continuous Infusions: . sodium chloride 75 mL/hr at 05/23/18 0957  . sodium chloride Stopped (05/21/18 1100)   PRN  Meds:.sodium chloride, acetaminophen **OR** acetaminophen, albuterol, bisacodyl, ondansetron **OR** ondansetron (ZOFRAN) IV, sodium chloride flush Medications Prior to Admission:  Prior to Admission medications   Medication Sig Start Date End Date Taking? Authorizing Provider  ALPRAZolam Prudy Feeler(XANAX) 0.5 MG tablet Take 0.5 mg by mouth daily as needed.  04/07/12  Yes [provider]  aspirin 81 MG tablet Take 81 mg by mouth every other day.    Yes [provider]  cetirizine (ZYRTEC ALLERGY) 10 MG tablet Take 10 mg by mouth  daily.   Yes [provider]  doxycycline (VIBRA-TABS) 100 MG tablet Take 100 mg by mouth 2 (two) times daily.   Yes [provider]  levothyroxine (SYNTHROID, LEVOTHROID) 50 MCG tablet Take 50 mcg by mouth as directed. Patient takes on Saturday and Sunday; all other days she takes 04/28/15  Yes [provider]  levothyroxine (SYNTHROID, LEVOTHROID) 75 MCG tablet Take 75 mcg by mouth as directed. Patient takes every day except Saturday and Sunday; on Saturday and Sunday, she takes   Yes [provider]  lisinopril (PRINIVIL,ZESTRIL) 20 MG tablet TAKE 1/2 TABLET BY MOUTH ONCE DAILY. 01/03/17  Yes Croitoru, Mihai, MD  pravastatin (PRAVACHOL) 40 MG tablet TAKE ONE TABLET BY MOUTH IN THE EVENING. 12/28/17  Yes Croitoru, Mihai, MD  Pseudoephedrine-Guaifenesin (MUCINEX D MAX STRENGTH) [phone] MG TB12 Take 1 tablet by mouth daily.   Yes [provider]  verapamil (VERELAN PM) 240 MG 24 hr capsule TAKE (1) CAPSULE BY MOUTH AT BEDTIME. 02/28/18  Yes Croitoru, Mihai, MD  zolpidem (AMBIEN) 10 MG tablet Take 10 mg by mouth at bedtime.  03/30/12  Yes [provider]   Allergies  Allergen Reactions  . Neosporin [Neomycin-Bacitracin Zn-Polymyx] Rash   Review of Systems  Unable to perform ROS: Mental status change    Physical Exam  Constitutional: She appears ill.  Appears weak and frail,  acutely/chronically ill.  HENT:  Head: Atraumatic.  Cardiovascular: Normal rate.  Pulmonary/Chest:  Work of breathing noted, extubated this a.m.  Abdominal: Soft. She exhibits no distension.  Neurological:  Nonverbal at this point  Skin: Skin is warm and dry.  Nursing note and vitals reviewed.   Vital Signs: BP (!) 141/69   Pulse (!) 129   Temp 99.8 F (37.7 C) (Oral)   Resp (!) 30   Ht 5' (1.524 m)   Wt 79.5 kg   SpO2 94%   BMI 34.23 kg/m  Pain Scale: CPOT   Pain Score: 0-No pain   SpO2: SpO2: 94 % O2 Device:SpO2: 94 % O2 Flow Rate: .O2 Flow Rate (L/min): 15 L/min  IO: Intake/output summary:   Intake/Output Summary (Last 24 hours) at 05/23/2018 1334 Last data filed at 05/23/2018 0957 Gross per 24 hour  Intake 2745.6 ml  Output 1550 ml  Net 1195.6 ml    LBM: Last BM Date: 05/14/18 Baseline Weight: Weight: 67.1 kg Most recent weight: Weight: 79.5 kg     Palliative Assessment/Data:   Flowsheet Rows     Most Recent Value  Intake Tab  Referral Department  Hospitalist  Unit at Time of Referral  ICU  Palliative Care Primary Diagnosis  Pulmonary  Date Notified  05/23/18  Palliative Care Type  New Palliative care  Reason for referral  Clarify Goals of Care  Date of Admission  05/07/2018  Date first seen by Palliative Care  05/23/18  # of days Palliative referral response time  0 Day(s)  # of days IP prior to Palliative referral  12  Clinical Assessment  Palliative Performance Scale Score  30%  Pain Max last 24 hours  Not able to report  Pain Min Last 24 hours  Not able to report  Dyspnea Max Last 24 Hours  Not able to report  Dyspnea Min Last 24 hours  Not able to report  Psychosocial & Spiritual Assessment  Palliative Care Outcomes  Patient/Family meeting held?  Yes      Time In: 1330 Time Out: 1405 Time Total: 35 minutes  Greater than 50%  of this time was spent counseling and coordinating care related to the above assessment and plan.  Signed  by: Katheran Awe, NP   Please contact Palliative Medicine Team phone at [phone] for questions and concerns.  For individual provider: See Loretha Stapler

## 2018-05-23 NOTE — Consult Note (Signed)
Reason for Consult: Acute kidney injury and hyperkalemia Referring Physician: Dr. Juanna Cao is an 67 y.o. female.  HPI: She is a patient was hypertension, renal artery stenosis, PVC and possibly chronic renal failure presently came to the hospital with complaints of weakness and difficulty breathing.  Initially she was seen by her primary care physician because of cough, chills and possibility of pneumonia was entertained and patient was put on antibiotics.  Since the patient did get better she ended up in the hospital.  Patient was found to have multi focal pneumonia sepsis and requiring intubation.  Presently patient is extubated but her renal function continued to decline hence consult is called.  At this moment because of her patient general condition unable to get any additional information.  Past Medical History:  Diagnosis Date  . Arthritis    Osteoprosis  . Chest pain   . Edema   . Hypertension 01/07/2012   Mildly abnormal renal doppler  . 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 PAD (peripheral artery disease) ([place]) 12/2009   right carotid endarterectomy  . Palpitation   . Renal artery stenosis ([facility])   . 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 Thyroid disease     Past Surgical History:  Procedure Laterality Date  . CARDIAC CATHETERIZATION  01/27/07   Normal right heart pressues, normal pulmonary capillary wedge pressure, normal cardiac output and cardiac index, she was normotensive intra-arterially, she had an EF 50%-55% with out reginal wall motion abnormality, no evidence of renal artery or PAD  . CAROTID ENDARTERECTOMY  January 08, 2010   Right cea  . EYE SURGERY     Cataract  . INNER EAR SURGERY    . [place] Disease     Hand  . TONSILLECTOMY    . torn cartlidge     Knee    Family History  Problem Relation Age of Onset  . Heart disease Mother        Congestive Heart Failure  . Heart attack Father    . Heart disease Sister   . Scoliosis Son     Social History:  reports that she quit smoking about 10 years ago. She has never used smokeless tobacco. She reports that she does not drink alcohol or use drugs.  Allergies:  Allergies  Allergen Reactions  . Neosporin [Neomycin-Bacitracin Zn-Polymyx] Rash    Medications: I have reviewed the patient's current medications.  Results for orders placed or performed during the hospital encounter of 05/12/2018 (from the past 48 hour(s))  Glucose, capillary     Status: Abnormal   Collection Time: 05/21/18  4:36 PM  Result Value Ref Range   Glucose-Capillary 184 (H) 70 - 99 mg/dL  Glucose, capillary     Status: Abnormal   Collection Time: 05/21/18  8:04 PM  Result Value Ref Range   Glucose-Capillary 202 (H) 70 - 99 mg/dL   Comment 1 Notify RN   Glucose, capillary     Status: Abnormal   Collection Time: 05/21/18 11:38 PM  Result Value Ref Range   Glucose-Capillary 180 (H) 70 - 99 mg/dL  Blood gas, arterial     Status: Abnormal   Collection Time: 05/22/18  4:15 AM  Result Value Ref Range   FIO2 40.00    Delivery systems VENTILATOR    Mode PRESSURE REGULATED VOLUME CONTROL    VT 600 mL   LHR 22 resp/min   Peep/cpap 8.0 cm H20  pH, Arterial 7.349 (L) 7.350 - 7.450   pCO2 arterial 41.8 32.0 - 48.0 mmHg   pO2, Arterial 73.2 (L) 83.0 - 108.0 mmHg   Bicarbonate 22.3 20.0 - 28.0 mmol/L   Acid-base deficit 2.3 (H) 0.0 - 2.0 mmol/L   O2 Saturation 93.3 %   Collection site RADIAL    Drawn by [id number]    Sample type ARTERIAL    Mechanical Rate 22     Comment: Performed at [facility], [address], [place], [place] [address]  Basic metabolic panel     Status: Abnormal   Collection Time: 05/22/18  4:24 AM  Result Value Ref Range   Sodium 148 (H) 135 - 145 mmol/L   Potassium 5.3 (H) 3.5 - 5.1 mmol/L   Chloride 118 (H) 98 - 111 mmol/L   CO2 23 22 - 32 mmol/L   Glucose, Bld 198 (H) 70 - 99 mg/dL   BUN 114 (H) 8 - 23 mg/dL    Comment:  RESULTS CONFIRMED BY MANUAL DILUTION   Creatinine, Ser 1.98 (H) 0.44 - 1.00 mg/dL   Calcium 8.4 (L) 8.9 - 10.3 mg/dL   GFR calc non Af Amer 25 (L) >60 mL/min   GFR calc Af Amer 28 (L) >60 mL/min    Comment: (NOTE) The eGFR has been calculated using the CKD EPI equation. This calculation has not been validated in all clinical situations. eGFR's persistently <60 mL/min signify possible Chronic Kidney Disease.    Anion gap 7 5 - 15    Comment: Performed at [facility], [address], [place], [place] [address]  CBC     Status: Abnormal   Collection Time: 05/22/18  4:24 AM  Result Value Ref Range   WBC 25.6 (H) 4.0 - 10.5 K/uL   RBC 2.97 (L) 3.87 - 5.11 MIL/uL   Hemoglobin 8.3 (L) 12.0 - 15.0 g/dL   HCT 27.4 (L) 36.0 - 46.0 %   MCV 92.3 80.0 - 100.0 fL   MCH 27.9 26.0 - 34.0 pg   MCHC 30.3 30.0 - 36.0 g/dL   RDW 15.1 11.5 - 15.5 %   Platelets 128 (L) 150 - 400 K/uL   nRBC 0.5 (H) 0.0 - 0.2 %    Comment: Performed at [facility], [address], [place], [place] [address]  Procalcitonin     Status: None   Collection Time: 05/22/18  4:24 AM  Result Value Ref Range   Procalcitonin 0.28 ng/mL    Comment:        Interpretation: PCT (Procalcitonin) <= 0.5 ng/mL: Systemic infection (sepsis) is not likely. Local bacterial infection is possible. (NOTE)       Sepsis PCT Algorithm           Lower Respiratory Tract                                      Infection PCT Algorithm    ----------------------------     ----------------------------         PCT < 0.25 ng/mL                PCT < 0.10 ng/mL         Strongly encourage             Strongly discourage   discontinuation of antibiotics    initiation of antibiotics    ----------------------------     -----------------------------  PCT 0.25 - 0.50 ng/mL            PCT 0.10 - 0.25 ng/mL               OR       >80% decrease in PCT            Discourage initiation of                                            antibiotics       Encourage discontinuation           of antibiotics    ----------------------------     -----------------------------         PCT >= 0.50 ng/mL              PCT 0.26 - 0.50 ng/mL               AND        <80% decrease in PCT             Encourage initiation of                                             antibiotics       Encourage continuation           of antibiotics    ----------------------------     -----------------------------        PCT >= 0.50 ng/mL                  PCT > 0.50 ng/mL               AND         increase in PCT                  Strongly encourage                                      initiation of antibiotics    Strongly encourage escalation           of antibiotics                                     -----------------------------                                           PCT <= 0.25 ng/mL                                                 OR                                        > 80% decrease in PCT  Discontinue / Do not initiate                                             antibiotics Performed at [facility], [address], [place], [place] [address]   Glucose, capillary     Status: Abnormal   Collection Time: 05/22/18  4:58 AM  Result Value Ref Range   Glucose-Capillary 179 (H) 70 - 99 mg/dL  Glucose, capillary     Status: Abnormal   Collection Time: 05/22/18  7:59 AM  Result Value Ref Range   Glucose-Capillary 185 (H) 70 - 99 mg/dL  Sodium, urine, random     Status: None   Collection Time: 05/22/18 11:15 AM  Result Value Ref Range   Sodium, Ur 22 mmol/L    Comment: Performed at [facility], [address], [place], [place] [address]  Creatinine, urine, random     Status: None   Collection Time: 05/22/18 11:15 AM  Result Value Ref Range   Creatinine, Urine 43.08 mg/dL    Comment: Performed at [facility], [address], [place], [place] [address]  Glucose, capillary     Status: Abnormal   Collection Time: 05/22/18  11:47 AM  Result Value Ref Range   Glucose-Capillary 208 (H) 70 - 99 mg/dL  Glucose, capillary     Status: Abnormal   Collection Time: 05/22/18  4:07 PM  Result Value Ref Range   Glucose-Capillary 197 (H) 70 - 99 mg/dL  Glucose, capillary     Status: Abnormal   Collection Time: 05/22/18  8:18 PM  Result Value Ref Range   Glucose-Capillary 216 (H) 70 - 99 mg/dL  Glucose, capillary     Status: Abnormal   Collection Time: 05/23/18 12:01 AM  Result Value Ref Range   Glucose-Capillary 197 (H) 70 - 99 mg/dL  Glucose, capillary     Status: Abnormal   Collection Time: 05/23/18  4:00 AM  Result Value Ref Range   Glucose-Capillary 140 (H) 70 - 99 mg/dL  Procalcitonin     Status: None   Collection Time: 05/23/18  4:12 AM  Result Value Ref Range   Procalcitonin 0.26 ng/mL    Comment:        Interpretation: PCT (Procalcitonin) <= 0.5 ng/mL: Systemic infection (sepsis) is not likely. Local bacterial infection is possible. (NOTE)       Sepsis PCT Algorithm           Lower Respiratory Tract                                      Infection PCT Algorithm    ----------------------------     ----------------------------         PCT < 0.25 ng/mL                PCT < 0.10 ng/mL         Strongly encourage             Strongly discourage   discontinuation of antibiotics    initiation of antibiotics    ----------------------------     -----------------------------       PCT 0.25 - 0.50 ng/mL            PCT 0.10 - 0.25 ng/mL  OR       >80% decrease in PCT            Discourage initiation of                                            antibiotics      Encourage discontinuation           of antibiotics    ----------------------------     -----------------------------         PCT >= 0.50 ng/mL              PCT 0.26 - 0.50 ng/mL               AND        <80% decrease in PCT             Encourage initiation of                                             antibiotics       Encourage  continuation           of antibiotics    ----------------------------     -----------------------------        PCT >= 0.50 ng/mL                  PCT > 0.50 ng/mL               AND         increase in PCT                  Strongly encourage                                      initiation of antibiotics    Strongly encourage escalation           of antibiotics                                     -----------------------------                                           PCT <= 0.25 ng/mL                                                 OR                                        > 80% decrease in PCT                                     Discontinue / Do not initiate  antibiotics Performed at [facility], [address] [place], [place] [address]   Blood gas, arterial     Status: Abnormal   Collection Time: 05/23/18  5:14 AM  Result Value Ref Range   FIO2 45.00    Delivery systems VENTILATOR    Mode PRESSURE REGULATED VOLUME CONTROL    VT 600 mL   LHR 22 resp/min   Peep/cpap 5.0 cm H20   pH, Arterial 7.351 7.350 - 7.450   pCO2 arterial 37.8 32.0 - 48.0 mmHg   pO2, Arterial 84.8 83.0 - 108.0 mmHg   Bicarbonate 20.8 20.0 - 28.0 mmol/L   Acid-base deficit 4.3 (H) 0.0 - 2.0 mmol/L   O2 Saturation 95.9 %   Patient temperature 37.0    Collection site LEFT RADIAL    Drawn by [id number]    Sample type ARTERIAL DRAW    Allens test (pass/fail) PASS PASS    Comment: Performed at [facility], [address] [place], [place] [address]  Glucose, capillary     Status: Abnormal   Collection Time: 05/23/18  7:53 AM  Result Value Ref Range   Glucose-Capillary 155 (H) 70 - 99 mg/dL  Basic metabolic panel     Status: Abnormal   Collection Time: 05/23/18  9:50 AM  Result Value Ref Range   Sodium 145 135 - 145 mmol/L   Potassium 5.9 (H) 3.5 - 5.1 mmol/L   Chloride 117 (H) 98 - 111 mmol/L   CO2 20 (L) 22 - 32 mmol/L   Glucose, Bld 203 (H) 70 - 99 mg/dL   BUN  130 (H) 8 - 23 mg/dL    Comment: RESULTS CONFIRMED BY MANUAL DILUTION   Creatinine, Ser 2.21 (H) 0.44 - 1.00 mg/dL   Calcium 8.3 (L) 8.9 - 10.3 mg/dL   GFR calc non Af Amer 22 (L) >60 mL/min   GFR calc Af Amer 26 (L) >60 mL/min   Anion gap 8 5 - 15    Comment: Performed at [facility], [address], [place], [place] [address]  CBC     Status: Abnormal   Collection Time: 05/23/18  9:50 AM  Result Value Ref Range   WBC 44.4 (H) 4.0 - 10.5 K/uL   RBC 3.04 (L) 3.87 - 5.11 MIL/uL   Hemoglobin 8.8 (L) 12.0 - 15.0 g/dL   HCT 27.9 (L) 36.0 - 46.0 %   MCV 91.8 80.0 - 100.0 fL   MCH 28.9 26.0 - 34.0 pg   MCHC 31.5 30.0 - 36.0 g/dL   RDW 14.9 11.5 - 15.5 %   Platelets 136 (L) 150 - 400 K/uL   nRBC 1.0 (H) 0.0 - 0.2 %    Comment: Performed at [facility], [address] [place], [place] [address]  Blood gas, arterial     Status: Abnormal   Collection Time: 05/23/18 11:13 AM  Result Value Ref Range   FIO2 0.45    Delivery systems VENTILATOR    Mode CONTINUOUS POSITIVE AIRWAY PRESSURE    Peep/cpap 5.0 cm H20   Pressure support 5.0 cm H20   pH, Arterial 7.373 7.350 - 7.450   pCO2 arterial 35.3 32.0 - 48.0 mmHg   pO2, Arterial 69.6 (L) 83.0 - 108.0 mmHg   Bicarbonate 20.9 20.0 - 28.0 mmol/L   Acid-base deficit 4.3 (H) 0.0 - 2.0 mmol/L   O2 Saturation 93.3 %   Patient temperature 37.0    Collection site LEFT RADIAL    Drawn by [id number]    Sample type  ARTERIAL DRAW    Allens test (pass/fail) PASS PASS    Comment: Performed at [facility], [address] [place], [place] [address]  Glucose, capillary     Status: Abnormal   Collection Time: 05/23/18 11:26 AM  Result Value Ref Range   Glucose-Capillary 207 (H) 70 - 99 mg/dL    US Renal  Result Date: 05/22/2018 CLINICAL DATA:  67 year old female with acute on chronic renal failure. Initial encounter. EXAM: RENAL / URINARY TRACT ULTRASOUND COMPLETE COMPARISON:  None. FINDINGS: Right Kidney: Renal measurements: 8.3 x 3.3 x 3.9 cm =  volume: 54.8 mL. Echogenicity within normal limits. Right upper pole 1.1 cm cyst suspected. No hydronephrosis. Small amount of perinephric fluid lower pole region. Left Kidney: Renal measurements: 7.9 x 3.9 x 4.1 cm = volume: 67.3 mL. Echogenicity within normal limits. No left renal mass noted. Small amount of perinephric fluid lower pole region. Bladder: Decompressed by Foley catheter not visualized. Liver of increased echogenicity consistent with fatty infiltration and/or hepatocellular disease. IMPRESSION: 1. No hydronephrosis. 2. Small amount of perinephric fluid lower pole region bilaterally. Etiology indeterminate. 3. Right upper pole 1.1 cm cyst suspected. 4. Bladder not visualized as decompressed with Foley catheter. 5. Liver of increased echogenicity consistent with fatty infiltration and/or hepatocellular disease. Electronically Signed   By: Genia Del M.D.   On: 05/22/2018 14:04   Dg Chest Port 1 View  Result Date: 05/23/2018 CLINICAL DATA:  NG tube placement. EXAM: PORTABLE CHEST 1 VIEW COMPARISON:  05/23/2018 FINDINGS: NG tube noted with tip over the stomach. PICC line noted with tip over superior vena cava. Heart size normal. Diffuse bilateral pulmonary interstitial prominence. Interstitial edema/pneumonitis could present this fashion. Tiny bilateral pleural effusions cannot be excluded. No pneumothorax. IMPRESSION: 1. NG tube noted with tip in the stomach. PICC line noted with tip over superior vena cava. 2. Bilateral interstitial prominence. Edema/pneumonitis could present this fashion. Small bilateral pleural effusions cannot be excluded. Electronically Signed   By: Marcello Moores  Register   On: 05/23/2018 15:00   Dg Chest Port 1 View  Result Date: 05/23/2018 CLINICAL DATA:  Respiratory failure EXAM: PORTABLE CHEST 1 VIEW COMPARISON:  05/22/2018 FINDINGS: Cardiac shadows within normal limits for portable technique. Right-sided PICC line, endotracheal tube and nasogastric catheter are noted.  Persistent atelectasis/infiltrate is noted in the right lung base. The overall inspiratory effort has decreased when compare with the prior exam. Some patient rotation accentuates the mediastinal markings although no other focal infiltrate is seen. No bony abnormality is noted. IMPRESSION: Persistent opacity in the right lung base. Tubes and lines as described. Electronically Signed   By: Inez Catalina M.D.   On: 05/23/2018 07:37   Dg Chest Port 1 View  Result Date: 05/22/2018 CLINICAL DATA:  Respiratory failure EXAM: PORTABLE CHEST 1 VIEW COMPARISON:  05/21/2018 FINDINGS: Cardiac shadow is stable. Endotracheal tube and nasogastric catheter is well as a right-sided PICC line are again seen in stable position. The lungs are well aerated bilaterally. Improved aeration in the left lung base and right upper lobe is noted. Persistent airspace opacity in the right lung base is seen. No sizable effusion is noted. IMPRESSION: Overall improved aeration with some persistent opacity in the right base. Electronically Signed   By: Inez Catalina M.D.   On: 05/22/2018 09:52    Review of Systems  Unable to perform ROS: Mental status change   Blood pressure (!) 145/68, pulse (!) 117, temperature 99.8 F (37.7 C), temperature source Oral, resp. rate 19, height 5' (  1.524 m), weight 79.5 kg, SpO2 97 %. Physical Exam  Constitutional: No distress.  Neck: No JVD present.  Cardiovascular: Normal rate and regular rhythm.  Respiratory: She has wheezes.  GI: She exhibits no distension. There is no rebound.  Musculoskeletal: She exhibits no edema.  Neurological: She is alert.  Patient is awake but does not answer any question.    Assessment/Plan: 1] acute kidney injury superimposed on chronic.  Her renal function has continued to clinic decline to present level.  This could be secondary to prerenal syndrome as patient has high BUN to creatinine ratio[this could happen from steroid and upper GI bleeding] ATN/possibly  vancomycin induced acute kidney injury[presently discontinued].  Dye  Might have  contribute but the time interval seems to be somewhat wide.  Patient is nonoliguric. 2] possible chronic renal failure: Long-standing.  Her creatinine was 1.65 on 10/28/2008,  1.51 on 7/7 2015, 1.26 on 05/06/2017 with EGFR of 44 cc/min hence is stage III.  Ultrasound of the kidneys showed right kidney to be 8.3 cm and left kidney 7.6 bilaterally small.  Patient with documented history of renal artery stenosis hence possibly ischemic. 3] altered mental status: Etiology not clear.  Patient does not seem to be communicating after she is extubated.  Possibly hypoxic encephalopathy however since her BUN is high uremic syndrome cannot be ruled out. 4] hyperkalemia: Potassium high.  Possibly from her renal failure. 5] acute respiratory failure with hypoxemia.  Patient was intubated but presently is extubated.  She has multifocal pneumonia. 6] patient with history of PVC: Presently controlled by Cardizem 9] hypertension: Her blood pressure seems to be reasonably controlled 8] history of hypothyroidism Plan: 1]We will increase IV fluid to 135 cc/h 2] if her BUN to creatinine ratio remain high possibly decreasing the dose of the steroid may be helpful. 3] we will DC Kayexalate 4] we will use LOKELMA, 10 mg p.o. twice daily 5] we will check a renal panel in the morning 6] we will follow her input and output and will consider possibly using diuretics if her urine output declines.  Jarius Dieudonne S 05/23/2018, 3:12 PM

## 2018-05-23 NOTE — Progress Notes (Signed)
150 mL fentanyl wasted into stericycle container. Witnessed by Demetrio LappingLawrence Hylton, RN

## 2018-05-23 NOTE — Progress Notes (Signed)
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 She had gone to her PCP a few days prior for a week of fevers, chills, increasing cough, and diagnosed with pneumonia, started on doxycycline and nebulized breathing treatments at home but has had no improvement. Over the past 24 hours she's grown increasingly weak, getting up out of bed rarely, much more short of breath when she attempts this and constantly feels like she's about to pass out. On initial evaluation she was afebrile, but was 100.7Fat home prior to taking tylenol. Tachycardic with soft BP, hypoxic to 88% on room air, improved with 2L O2, tachypneic. WBC 11.3k, flu negative, lactic acid elevated. Levaquin and IV fluids given after blood cultures drawn.She continued to be hypoxic and developed wheezing for which steroids and breathing treatments were provided. Respiratory effort increased and hypoxia worsened prompting pulmonology consultation and transfer to SDU/ICU on 11/18.BiPAP started with improvement in oxygenation. The patient did not show any improvement on BiPAP. She was intubated on 05/17/18.  Assessment/Plan: Acute hypoxic respiratory failure: -Due to multifocal pneumonia, bronchospasmandan element of interstitial edema. -Stopped vancomycin as she failed an adequate trial of doxycycline, MRSA negative. -decreasesolumedrol to 60 IV q 12 - Intubated 05/17/18; extubated 05/23/18 -received lasix 11/18,11/19, 11/21 11/25-repeat CXR--personally reviewed--slight improvement RLL, LLL opacities; chronic interstitial markings improvinginterstitialmarkings -FiO2 down to 0.45;  -weaning per pulmonary  Sepsis  due to multilobar pneumonia: -Flu negative. -RVP = rhino/entervirus - Continuecefepime -PCT declining 0.49>>>0.34>>>10.81>>>0.28 -tracheal aspirate for culture--C.albicans-->unclear clinicaly significance -continues running low grade fever -repeat blood cultureneg -repeat UA without pyuria -d/c levofloxacin -discontinue cefepime--finished 7 days 11/25  Leukocytosis -WBC increased to 44K today -CT chest and CT abd -repeat blood culture -repeat UA/culture -pt is afebrile and hemodynamically stable  Acute metabolic Encephalopathy -pt not speaking since extubation -CT brain -ammonia  COPD Exacerbation -pt has at least 20 pack year history of tobacco -continuesolumedrol--decrease to q 12 hours -conitinuepulmicort -continue bronchodilators  FEN--Hyperkalemia/Hypernatremia -placedNG and give kayexalate -continueenteral feeding--tolerating -check mag--2.6 -check phos--4.8 -Continue 1/2NS  AKI -consult nephrology -serum creatinine continues to climb despite IVF -renal us--neg for hydronephrosis  HTN: -Continue to hold lisinopril -continue diltiazem  Tachycardia/atrial tachycardia -questionintermittentatrial tachycardia/SVT -Troponin negative -EKG--sinus--nonspecific T wave changes -change albuterol to xopenex -increasedilitazem60 mg po q6 (on verapamil at home) -05/15/18 Echo--EF 55-60%, no WMA, normal RV -personally reviewed EKG--sinus/atrial tachycardia, no STT changes  Hypothyroidism:  - Continue home variable synthroid dosing. -change synthroid to IV  Thrombocytosis>>>>Thrombocytopenia -d/c lovenox -SCDs  Acute on chronic renal failureStage III CKD: -Creatinine at baseline on admission.Had lasix, vancomycin, and contrast  -also due to sepsis and hemodynamic changes -baseline creatinine 0.8-1.1 -serum creatinine up due to furosemide -renal ultrasound  Carotid stenosis: -Continue QOD ASA.  Hyperlipidemia: -Continue  statin  Left Heel Pressure Injury -not present during admission -place prevalon boots    Disposition Plan:remain inICU Family Communication: Family at bedside updated11/26  Consultants:pulmonary  Code Status: FULL   DVT Prophylaxis:Humble Lovenox   Procedures: As Listed in Progress Note Above  Antibiotics: vanco 11/18 levoflox 11/14>>>11/22 Cefepime 11/18>>>11/24   The patient is critically ill with multiple organ systems failure and requires high complexity decision making for assessment and support, frequent evaluation and titration  of therapies, application of advanced monitoring technologies and extensive interpretation of multiple databases.  Critical care time -35 mins.    Subjective: Patient is encephalopathic at this time.  She is not answering any questions.  She tracks with her eyes.  She does not follow commands.  The patient was extubated late this morning.  There is no vomiting, diarrhea.  She remains on high flow nasal cannula.  Objective: Vitals:   05/23/18 0849 05/23/18 0900 05/23/18 1000 05/23/18 1124  BP:  (!) 143/64 (!) 141/69   Pulse:  (!) 121 (!) 129   Resp:  (!) 28 (!) 30   Temp:    99.8 F (37.7 C)  TempSrc:    Oral  SpO2: 94%     Weight:      Height:        Intake/Output Summary (Last 24 hours) at 05/23/2018 1311 Last data filed at 05/23/2018 0957 Gross per 24 hour  Intake 2745.6 ml  Output 1550 ml  Net 1195.6 ml   Weight change: 0 kg Exam:   General:  Pt is alert, does not follows commands appropriately  HEENT: No icterus, No thrush, No neck mass, [place]/AT  Cardiovascular: RRR, S1/S2, no rubs, no gallops  Respiratory bibasilar rales.  No wheezing.  Abdomen: Soft/+BS, non tender, non distended, no guarding  Extremities: No edema, No lymphangitis, No petechiae, No rashes, no synovitis   Data Reviewed: I have personally reviewed following labs and imaging studies Basic Metabolic Panel: Recent Labs   Lab 05/17/18 1026 05/18/18 0356 05/19/18 0533 05/20/18 0530 05/21/18 0419 05/22/18 0424 05/23/18 0950  NA  --  141 143 145 148* 148* 145  K  --  4.3 4.8 4.8 5.3* 5.3* 5.9*  CL  --  111 112* 115* 117* 118* 117*  CO2  --  23 24 25 25 23  20*  GLUCOSE  --  195* 182* 187* 164* 198* 203*  BUN  --  32* 56* 70* 88* 114* 130*  CREATININE  --  1.21* 1.43* 1.39* 1.53* 1.98* 2.21*  CALCIUM  --  8.6* 8.6* 8.4* 8.6* 8.4* 8.3*  MG 2.1  --  2.6*  --   --   --   --   PHOS 4.0 3.7 4.8*  --   --   --   --    Liver Function Tests: Recent Labs  Lab 05/18/18 0356 05/20/18 0530  AST 21 17  ALT 22 16  ALKPHOS 87 71  BILITOT 0.5 0.5  PROT 5.9* 5.7*  ALBUMIN 1.9* 1.8*   No results for input(s): LIPASE, AMYLASE in the last 168 hours. No results for input(s): AMMONIA in the last 168 hours. Coagulation Profile: No results for input(s): INR, PROTIME in the last 168 hours. CBC: Recent Labs  Lab 05/19/18 0533 05/20/18 0530 05/21/18 0419 05/22/18 0424 05/23/18 0950  WBC 24.2* 22.4* 21.2* 25.6* 44.4*  NEUTROABS 21.6*  --  17.4*  --   --   HGB 9.4* 8.4* 8.1* 8.3* 8.8*  HCT 31.0* 28.0* 26.5* 27.4* 27.9*  MCV 93.9 94.0 93.3 92.3 91.8  PLT 235 162 132* 128* 136*   Cardiac Enzymes: No results for input(s): CKTOTAL, CKMB, CKMBINDEX, TROPONINI in the last 168 hours. BNP: Invalid input(s): POCBNP CBG: Recent Labs  Lab 05/22/18 2018 05/23/18 0001 05/23/18 0400 05/23/18 0753 05/23/18 1126  GLUCAP 216* 197* 140* 155* 207*   HbA1C: No results for input(s): HGBA1C in the last 72 hours. Urine analysis:    Component Value Date/Time   COLORURINE  YELLOW 05/19/2018 1145   APPEARANCEUR HAZY (A) 05/19/2018 1145   LABSPEC 1.020 05/19/2018 1145   PHURINE 6.0 05/19/2018 1145   GLUCOSEU NEGATIVE 05/19/2018 1145   HGBUR LARGE (A) 05/19/2018 1145   BILIRUBINUR NEGATIVE 05/19/2018 1145   KETONESUR NEGATIVE 05/19/2018 1145   PROTEINUR 30 (A) 05/19/2018 1145   UROBILINOGEN 0.2 01/07/2010 1316    NITRITE NEGATIVE 05/19/2018 1145   LEUKOCYTESUR NEGATIVE 05/19/2018 1145   Sepsis Labs: @LABRCNTIP (procalcitonin:4,lacticidven:4) ) Recent Results (from the past 240 hour(s))  MRSA PCR Screening     Status: None   Collection Time: 05/15/18  7:17 AM  Result Value Ref Range Status   MRSA by PCR NEGATIVE NEGATIVE Final    Comment:        The GeneXpert MRSA Assay (FDA approved for NASAL specimens only), is one component of a comprehensive MRSA colonization surveillance program. It is not intended to diagnose MRSA infection nor to guide or monitor treatment for MRSA infections. Performed at [facility], [address] [place], [place] [address]   Respiratory Panel by PCR     Status: Abnormal   Collection Time: 05/17/18  5:45 PM  Result Value Ref Range Status   Adenovirus NOT DETECTED NOT DETECTED Final   Coronavirus 229E NOT DETECTED NOT DETECTED Final   Coronavirus HKU1 NOT DETECTED NOT DETECTED Final   Coronavirus NL63 NOT DETECTED NOT DETECTED Final   Coronavirus OC43 NOT DETECTED NOT DETECTED Final   Metapneumovirus NOT DETECTED NOT DETECTED Final   Rhinovirus / Enterovirus DETECTED (A) NOT DETECTED Final   Influenza A NOT DETECTED NOT DETECTED Final   Influenza B NOT DETECTED NOT DETECTED Final   Parainfluenza Virus 1 NOT DETECTED NOT DETECTED Final   Parainfluenza Virus 2 NOT DETECTED NOT DETECTED Final   Parainfluenza Virus 3 NOT DETECTED NOT DETECTED Final   Parainfluenza Virus 4 NOT DETECTED NOT DETECTED Final   Respiratory Syncytial Virus NOT DETECTED NOT DETECTED Final   Bordetella pertussis NOT DETECTED NOT DETECTED Final   Chlamydophila pneumoniae NOT DETECTED NOT DETECTED Final   Mycoplasma pneumoniae NOT DETECTED NOT DETECTED Final    Comment: Performed at [facility], [address] [place], [place] [address]  Culture, respiratory (non-expectorated)     Status: None   Collection Time: 05/17/18  5:45 PM  Result Value Ref Range Status   Specimen  Description   Final    TRACHEAL ASPIRATE Performed at [facility], [address] [place], [place] [address]    Special Requests   Final    NONE Performed at [facility], [address] [place], [place] [address]    Gram Stain   Final    RARE WBC PRESENT, PREDOMINANTLY PMN NO ORGANISMS SEEN Performed at [facility], [address] [place], [place] [address]    Culture FEW CANDIDA ALBICANS  Final   Report Status 05/20/2018 FINAL  Final  Culture, blood (Routine X 2) w Reflex to ID Panel     Status: None (Preliminary result)   Collection Time: 05/19/18 11:06 AM  Result Value Ref Range Status   Specimen Description BLOOD PICC LINE DRAWN BY RN  Final   Special Requests   Final    BOTTLES DRAWN AEROBIC AND ANAEROBIC Blood Culture adequate volume   Culture   Final    NO GROWTH 4 DAYS Performed at [facility], [address] [place], [place] [address]    Report Status PENDING  Incomplete  Culture, blood (Routine X 2) w Reflex  to ID Panel     Status: None (Preliminary result)   Collection Time: 05/19/18 11:09 AM  Result Value Ref Range Status   Specimen Description BLOOD LEFT HAND DRAWN BY RN  Final   Special Requests   Final    BOTTLES DRAWN AEROBIC AND ANAEROBIC Blood Culture results may not be optimal due to an excessive volume of blood received in culture bottles   Culture   Final    NO GROWTH 4 DAYS Performed at [facility], [address], [place], [place] [address]    Report Status PENDING  Incomplete  Culture, Urine     Status: None   Collection Time: 05/19/18 11:45 AM  Result Value Ref Range Status   Specimen Description   Final    URINE, CATHETERIZED Performed at [facility], [address], [place], [place] [address]    Special Requests   Final    NONE Performed at [facility], [address] [place], [place] [address]    Culture   Final    NO GROWTH Performed at [facility], [address], [place], [place] [address]    Report Status  05/21/2018 FINAL  Final     Scheduled Meds: . aspirin EC  81 mg Oral Q48H  . budesonide (PULMICORT) nebulizer solution  0.5 mg Nebulization BID  . Chlorhexidine Gluconate Cloth  6 each Topical Daily  . diltiazem  60 mg Oral Q6H  . docusate  100 mg Per Tube BID  . feeding supplement (PRO-STAT SUGAR FREE 64)  30 mL Per Tube Daily  . insulin aspart  2-6 Units Subcutaneous Q4H  . ipratropium  0.5 mg Nebulization Q6H  . levalbuterol  1.25 mg Nebulization Q6H  . levothyroxine  37.5 mcg Intravenous Daily  . mouth rinse  15 mL Mouth Rinse BID  . methylPREDNISolone (SOLU-MEDROL) injection  60 mg Intravenous Q12H  . metoprolol tartrate  2.5 mg Intravenous Q6H  . pantoprazole (PROTONIX) IV  40 mg Intravenous Q24H  . sodium chloride flush  10-40 mL Intracatheter Q12H  . sodium chloride flush  3 mL Intravenous Q12H   Continuous Infusions: . sodium chloride 75 mL/hr at 05/23/18 0957  . sodium chloride Stopped (05/21/18 1100)    Procedures/Studies: Dg Chest 2 View  Result Date: 30-May-2018 CLINICAL DATA:  Recent pneumonia with persistent cough, fever, and shortness of breath. EXAM: CHEST - 2 VIEW COMPARISON:  May 08, 2018 FINDINGS: Extensive airspace disease is noted throughout much of the right upper and lower lobe regions. There is also patchy infiltrate in the left lower lobe. The degree of consolidation on the right is stable. There is slight the increased consolidation in the left base. Heart size and pulmonary vascularity are normal. No adenopathy. No bone lesions. IMPRESSION: Multifocal pneumonia. Areas of airspace opacity in the right upper and lower lobes appears essentially stable compared to 3 days prior. Increase in infiltrate left base. Stable cardiac silhouette. No adenopathy evident. Electronically Signed   By: Bretta Bang III M.D.   On: 30-May-2018 12:57   Dg Chest 2 View  Result Date: 05/08/2018 CLINICAL DATA:  Productive cough and fever for a couple weeks, former  smoker, history hypertension, cardiomyopathy EXAM: CHEST - 2 VIEW COMPARISON:  None FINDINGS: Normal heart size, mediastinal contours, and pulmonary vascularity. Scattered opacities throughout the RIGHT lung likely representing pneumonia. However these are somewhat patchy/nodular in appearance and require follow-up until resolution to exclude underlying abnormalities including tumor. Minimal atelectasis versus consolidation in LEFT lower lobe.  Remaining LEFT lung clear. Elevation of RIGHT diaphragm. No pleural effusion or pneumothorax. IMPRESSION: Predominately RIGHT-sided pulmonary infiltrates consistent with pneumonia. Followup PA and lateral chest X-ray is recommended in 3-4 weeks following trial of antibiotic therapy to ensure resolution and exclude underlying malignancy. These results will be called to the ordering clinician or representative by the Radiologist Assistant, and communication documented in the PACS or zVision Dashboard. Electronically Signed   By: Ulyses Southward M.D.   On: 05/08/2018 11:45   Ct Angio Chest Pe W Or Wo Contrast  Result Date: 05/14/2018 CLINICAL DATA:  Shortness of breath worsening today. Clinical concern for pulmonary embolus. Positive D-dimer. EXAM: CT ANGIOGRAPHY CHEST WITH CONTRAST TECHNIQUE: Multidetector CT imaging of the chest was performed using the standard protocol during bolus administration of intravenous contrast. Multiplanar CT image reconstructions and MIPs were obtained to evaluate the vascular anatomy. CONTRAST:  75mL ISOVUE-370 IOPAMIDOL (ISOVUE-370) INJECTION 76% COMPARISON:  None FINDINGS: Cardiovascular: The heart size is normal. No substantial pericardial effusion. No thoracic aortic aneurysm. No filling defect in the opacified pulmonary arteries to suggest the presence of an acute pulmonary embolus. Mediastinum/Nodes: 14 mm short axis subcarinal lymph node is associated with mild bilateral hilar lymphadenopathy. The esophagus has normal imaging features.  There is no axillary lymphadenopathy. Lungs/Pleura: The central tracheobronchial airways are patent. Asymmetric patchy airspace disease, right greater than left is associated with interlobular septal thickening. Confluent airspace disease noted in the right lower lobe posterior left lower lobe. Small to moderate right pleural effusion evident with small left pleural effusion. Upper Abdomen: The liver shows diffusely decreased attenuation suggesting steatosis. Musculoskeletal: No worrisome lytic or sclerotic osseous abnormality. Review of the MIP images confirms the above findings. IMPRESSION: 1. No CT evidence for acute pulmonary embolus. 2. Bilateral patchy, right greater than left airspace disease compatible with multifocal pneumonia. 3. Mild mediastinal and bilateral hilar lymphadenopathy. 4. Small to moderate bilateral pleural effusions.  The Electronically Signed   By: Kennith Center M.D.   On: 05/14/2018 17:35   US Renal  Result Date: 05/22/2018 CLINICAL DATA:  67 year old female with acute on chronic renal failure. Initial encounter. EXAM: RENAL / URINARY TRACT ULTRASOUND COMPLETE COMPARISON:  None. FINDINGS: Right Kidney: Renal measurements: 8.3 x 3.3 x 3.9 cm = volume: 54.8 mL. Echogenicity within normal limits. Right upper pole 1.1 cm cyst suspected. No hydronephrosis. Small amount of perinephric fluid lower pole region. Left Kidney: Renal measurements: 7.9 x 3.9 x 4.1 cm = volume: 67.3 mL. Echogenicity within normal limits. No left renal mass noted. Small amount of perinephric fluid lower pole region. Bladder: Decompressed by Foley catheter not visualized. Liver of increased echogenicity consistent with fatty infiltration and/or hepatocellular disease. IMPRESSION: 1. No hydronephrosis. 2. Small amount of perinephric fluid lower pole region bilaterally. Etiology indeterminate. 3. Right upper pole 1.1 cm cyst suspected. 4. Bladder not visualized as decompressed with Foley catheter. 5. Liver of increased  echogenicity consistent with fatty infiltration and/or hepatocellular disease. Electronically Signed   By: Lacy Duverney M.D.   On: 05/22/2018 14:04   Dg Chest Port 1 View  Result Date: 05/23/2018 CLINICAL DATA:  Respiratory failure EXAM: PORTABLE CHEST 1 VIEW COMPARISON:  05/22/2018 FINDINGS: Cardiac shadows within normal limits for portable technique. Right-sided PICC line, endotracheal tube and nasogastric catheter are noted. Persistent atelectasis/infiltrate is noted in the right lung base. The overall inspiratory effort has decreased when compare with the prior exam. Some patient rotation accentuates the mediastinal markings although no other focal infiltrate is seen. No bony  abnormality is noted. IMPRESSION: Persistent opacity in the right lung base. Tubes and lines as described. Electronically Signed   By: Alcide Clever M.D.   On: 05/23/2018 07:37   Dg Chest Port 1 View  Result Date: 05/22/2018 CLINICAL DATA:  Respiratory failure EXAM: PORTABLE CHEST 1 VIEW COMPARISON:  05/21/2018 FINDINGS: Cardiac shadow is stable. Endotracheal tube and nasogastric catheter is well as a right-sided PICC line are again seen in stable position. The lungs are well aerated bilaterally. Improved aeration in the left lung base and right upper lobe is noted. Persistent airspace opacity in the right lung base is seen. No sizable effusion is noted. IMPRESSION: Overall improved aeration with some persistent opacity in the right base. Electronically Signed   By: Alcide Clever M.D.   On: 05/22/2018 09:52   Dg Chest Port 1 View  Result Date: 05/21/2018 CLINICAL DATA:  Respiratory failure, ventilatory support EXAM: PORTABLE CHEST 1 VIEW COMPARISON:  05/20/2018 FINDINGS: Endotracheal tube 4.4 cm above the carina. Exam is rotated to the right. Right PICC line tip upper SVC level. NG tube enters the proximal stomach. Minimal improvement in asymmetric patchy bilateral airspace disease/pneumonia involving the right upper lobe and  both lower lobes. No enlarging effusion by plain radiography. No large pneumothorax. Overall stable exam compared to yesterday. IMPRESSION: Stable support apparatus Persistent bilateral patchy airspace process, minimal improvement, more compatible with multifocal pneumonia by comparison CT Electronically Signed   By: Judie Petit.  Shick M.D.   On: 05/21/2018 08:50   Dg Chest Port 1 View  Result Date: 05/20/2018 CLINICAL DATA:  Hypoxia.  Acute respiratory failure EXAM: PORTABLE CHEST 1 VIEW COMPARISON:  Chest radiograph 05/19/2018 FINDINGS: ET tube terminates in the mid trachea. Right upper extremity PICC line tip projects over the superior vena cava. Enteric tube courses inferior to the diaphragm. Monitoring leads overlie the patient. Stable cardiac and mediastinal contours. Similar-appearing bilateral interstitial pulmonary opacities. No pleural effusion or pneumothorax. IMPRESSION: Support apparatus as above. Similar-appearing interstitial opacities favored to represent edema. Electronically Signed   By: Annia Belt M.D.   On: 05/20/2018 08:58   Dg Chest Port 1 View  Result Date: 05/19/2018 CLINICAL DATA:  Nasogastric tube placement. EXAM: PORTABLE CHEST 1 VIEW COMPARISON:  Radiograph of May 18, 2018. FINDINGS: Stable cardiomediastinal silhouette. Endotracheal and nasogastric tubes are unchanged in position. Right-sided PICC line is unchanged in position. No pneumothorax is noted. Mildly improved bilateral lung opacities are noted most consistent with improving edema. Small pleural effusions may be present. Bony thorax is unremarkable. IMPRESSION: Stable support apparatus. Probable mildly improved bilateral pulmonary edema. Electronically Signed   By: Lupita Raider, M.D.   On: 05/19/2018 10:23   Dg Chest Port 1 View  Result Date: 05/18/2018 CLINICAL DATA:  Recent pneumonia EXAM: PORTABLE CHEST 1 VIEW COMPARISON:  Yesterday FINDINGS: Endotracheal tube tip at the clavicular heads. An orogastric tube  reaches the stomach at least. Right upper extremity PICC in the interim with tip at the SVC. Extensive interstitial and airspace opacity with a patchy appearance. No visible effusion or pneumothorax. Normal heart size. Artifact from EKG leads. IMPRESSION: 1. Unremarkable hardware positioning. 2. Multifocal pneumonia with possible edema. Electronically Signed   By: Marnee Spring M.D.   On: 05/18/2018 07:18   Portable Chest X-ray  Result Date: 05/17/2018 CLINICAL DATA:  Evaluate endotracheal tube placement EXAM: PORTABLE CHEST 1 VIEW COMPARISON:  Portable chest x-ray of 05/17/2017 FINDINGS: Endotracheal tube has now been placed with the tip approximately 2.7 cm above the carina.  There is little change in patchy lung opacities greatest in the right mid and upper lung which appears most typical of pneumonia. However as noted on the prior dictation, superimposed edema cannot be excluded. Mild cardiomegaly is stable. IMPRESSION: 1. Tip of endotracheal tube 2.7 cm above the carina. 2. Little change in lung opacities as noted previously suspicious for pneumonia and possibly superimposed edema. Electronically Signed   By: Dwyane Dee M.D.   On: 05/17/2018 10:12   Dg Chest Port 1 View  Result Date: 05/17/2018 CLINICAL DATA:  Respiratory failure EXAM: PORTABLE CHEST 1 VIEW COMPARISON:  05/16/2018 FINDINGS: Right upper lobe and left lower lobe opacities, suspicious for pneumonia. Superimposed interstitial opacities, suspicious for interstitial edema. No definite pleural effusions. The heart is normal in size. No pneumothorax. IMPRESSION: Multifocal opacities, right upper lobe predominant, suspicious for pneumonia. Superimposed interstitial edema is also suspected. Overall appearance is similar to the prior. Electronically Signed   By: Charline Bills M.D.   On: 05/17/2018 09:12   Dg Chest Port 1 View  Result Date: 05/16/2018 CLINICAL DATA:  Respiratory failure EXAM: PORTABLE CHEST 1 VIEW COMPARISON:  CTA  chest dated 05/14/2018 FINDINGS: Multifocal patchy opacities, right upper lobe predominant, suspicious for pneumonia. Superimposed mild interstitial edema is possible. No definite pleural effusion. No pneumothorax. The heart is normal in size. IMPRESSION: Multifocal patchy opacities, right upper lobe predominant, suspicious for pneumonia. Superimposed mild interstitial edema is possible. These findings are better evaluated on recent CT. Electronically Signed   By: Charline Bills M.D.   On: 05/16/2018 09:32   [place] Ekg Site Rite  Result Date: 05/17/2018 If Site Rite image not attached, placement could not be confirmed due to current cardiac rhythm.   Catarina Hartshorn, DO  [facility] Pager [phone]  If 7PM-7AM, please contact night-coverage [email or web address] Password [email or web address] 05/23/2018, 1:11 PM   LOS: 11 days

## 2018-05-23 NOTE — Care Management Note (Signed)
Case Management Note  Patient Details  Name: Ma RingsBrenda C Haider MRN: [id number] Date of Birth: 04/15/1951  If discussed at Long Length of Stay Meetings, dates discussed:  05/23/2018  Additional Comments:  Taniah Reinecke, Chrystine OilerSharley Diane, RN 05/23/2018, 12:04 PM

## 2018-05-24 DIAGNOSIS — L89626 Pressure-induced deep tissue damage of left heel: Secondary | ICD-10-CM

## 2018-05-24 DIAGNOSIS — Z7189 Other specified counseling: Secondary | ICD-10-CM

## 2018-05-24 LAB — CBC WITH DIFFERENTIAL/PLATELET
Abs Immature Granulocytes: 3.19 10*3/uL — ABNORMAL HIGH (ref 0.00–0.07)
BASOS ABS: 0 10*3/uL (ref 0.0–0.1)
BASOS PCT: 0 %
Eosinophils Absolute: 0.1 10*3/uL (ref 0.0–0.5)
Eosinophils Relative: 0 %
HCT: 26.7 % — ABNORMAL LOW (ref 36.0–46.0)
Hemoglobin: 8.5 g/dL — ABNORMAL LOW (ref 12.0–15.0)
Immature Granulocytes: 6 %
Lymphocytes Relative: 7 %
Lymphs Abs: 3.8 10*3/uL (ref 0.7–4.0)
MCH: 28.8 pg (ref 26.0–34.0)
MCHC: 31.8 g/dL (ref 30.0–36.0)
MCV: 90.5 fL (ref 80.0–100.0)
MONO ABS: 1.9 10*3/uL — AB (ref 0.1–1.0)
Monocytes Relative: 3 %
NEUTROS PCT: 84 %
NRBC: 0.5 % — AB (ref 0.0–0.2)
Neutro Abs: 46.4 10*3/uL — ABNORMAL HIGH (ref 1.7–7.7)
PLATELETS: 99 10*3/uL — AB (ref 150–400)
RBC: 2.95 MIL/uL — AB (ref 3.87–5.11)
RDW: 14.8 % (ref 11.5–15.5)
WBC: 55.4 10*3/uL — AB (ref 4.0–10.5)

## 2018-05-24 LAB — BLOOD GAS, ARTERIAL
Acid-base deficit: 5.1 mmol/L — ABNORMAL HIGH (ref 0.0–2.0)
Bicarbonate: 20.3 mmol/L (ref 20.0–28.0)
DRAWN BY: 213101
FIO2: 100
O2 Saturation: 95.8 %
PATIENT TEMPERATURE: 37
PH ART: 7.361 (ref 7.350–7.450)
pCO2 arterial: 35 mmHg (ref 32.0–48.0)
pO2, Arterial: 86.7 mmHg (ref 83.0–108.0)

## 2018-05-24 LAB — BASIC METABOLIC PANEL
ANION GAP: 7 (ref 5–15)
BUN: 116 mg/dL — ABNORMAL HIGH (ref 8–23)
CO2: 19 mmol/L — ABNORMAL LOW (ref 22–32)
Calcium: 7.7 mg/dL — ABNORMAL LOW (ref 8.9–10.3)
Chloride: 117 mmol/L — ABNORMAL HIGH (ref 98–111)
Creatinine, Ser: 1.99 mg/dL — ABNORMAL HIGH (ref 0.44–1.00)
GFR, EST AFRICAN AMERICAN: 29 mL/min — AB (ref >60)
GFR, EST NON AFRICAN AMERICAN: 25 mL/min — AB (ref >60)
Glucose, Bld: 128 mg/dL — ABNORMAL HIGH (ref 70–99)
POTASSIUM: 4.7 mmol/L (ref 3.5–5.1)
SODIUM: 143 mmol/L (ref 135–145)

## 2018-05-24 LAB — RENAL FUNCTION PANEL
Albumin: 2 g/dL — ABNORMAL LOW (ref 3.5–5.0)
Anion gap: 7 (ref 5–15)
BUN: 116 mg/dL — AB (ref 8–23)
CHLORIDE: 117 mmol/L — AB (ref 98–111)
CO2: 19 mmol/L — AB (ref 22–32)
CREATININE: 2.03 mg/dL — AB (ref 0.44–1.00)
Calcium: 7.8 mg/dL — ABNORMAL LOW (ref 8.9–10.3)
GFR calc Af Amer: 28 mL/min — ABNORMAL LOW (ref >60)
GFR, EST NON AFRICAN AMERICAN: 24 mL/min — AB (ref >60)
Glucose, Bld: 129 mg/dL — ABNORMAL HIGH (ref 70–99)
Phosphorus: 4.5 mg/dL (ref 2.5–4.6)
Potassium: 4.7 mmol/L (ref 3.5–5.1)
Sodium: 143 mmol/L (ref 135–145)

## 2018-05-24 LAB — CULTURE, BLOOD (ROUTINE X 2)
Culture: NO GROWTH
Culture: NO GROWTH
Special Requests: ADEQUATE

## 2018-05-24 LAB — AMMONIA: Ammonia: 20 umol/L (ref 9–35)

## 2018-05-24 LAB — GLUCOSE, CAPILLARY
GLUCOSE-CAPILLARY: 160 mg/dL — AB (ref 70–99)
Glucose-Capillary: 117 mg/dL — ABNORMAL HIGH (ref 70–99)
Glucose-Capillary: 149 mg/dL — ABNORMAL HIGH (ref 70–99)

## 2018-05-24 MED ORDER — MORPHINE SULFATE (CONCENTRATE) 10 MG/0.5ML PO SOLN
2.6000 mg | ORAL | Status: DC | PRN
  Administered 2018-05-24 (×3): 5 mg via ORAL
  Administered 2018-05-24: 2.6 mg via ORAL
  Administered 2018-05-25 (×2): 5 mg via ORAL
  Filled 2018-05-24 (×6): qty 0.5

## 2018-05-24 MED ORDER — POLYVINYL ALCOHOL 1.4 % OP SOLN
2.0000 [drp] | OPHTHALMIC | Status: DC | PRN
  Administered 2018-05-24: 2 [drp] via OPHTHALMIC
  Filled 2018-05-24: qty 15

## 2018-05-24 MED ORDER — ALPRAZOLAM 0.5 MG PO TABS: 0.5000 mg | ORAL_TABLET | Freq: Three times a day (TID) | ORAL | Status: DC | PRN

## 2018-05-24 MED ORDER — ATROPINE SULFATE 1 % OP SOLN: 2.0000 [drp] | Freq: Four times a day (QID) | OPHTHALMIC | Status: DC | PRN

## 2018-05-24 NOTE — Plan of Care (Signed)
  Problem: Clinical Measurements: Goal: Ability to maintain clinical measurements within normal limits will improve Outcome: Not Progressing Goal: Respiratory complications will improve Outcome: Not Progressing   Problem: Respiratory: Goal: Ability to maintain adequate ventilation will improve Outcome: Not Progressing Goal: Ability to maintain a clear airway will improve Outcome: Not Progressing  Patient on comfort care with PRN morphine.

## 2018-05-24 NOTE — Progress Notes (Signed)
Present with family and [name] during Goals of Care discussion for emotional and spiritual support. Family is supportive of decisions for comfort care and appear to be processing through their grief with one another along with others. We have prayed together for comfort and mercy, along with healing and peace. She is one of eight children, 4 boys and 4 girls. Three brothers have died.

## 2018-05-24 NOTE — Progress Notes (Signed)
Breathing treatment started and stopped about half way through pt spo2 dropped down to 81% NRB placed back on 100% nurse informed BS wheezes and rhonchi

## 2018-05-24 NOTE — Progress Notes (Signed)
Nurse called due to pt spo2 down to mid 80% and not coming back up. Pt instructed to breathe through her nose to try to increase spo2 but not following command pt placed on 100% NRB spo2 increased to 96% and bed CPT given pt coughed up thick secretion and continue to cough

## 2018-05-24 NOTE — Progress Notes (Signed)
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 She had gone to her PCP a few days prior for a week of fevers, chills, increasing cough, and diagnosed with pneumonia, started on doxycycline and nebulized breathing treatments at home but has had no improvement. Over the past 24 hours she's grown increasingly weak, getting up out of bed rarely, much more short of breath when she attempts this and constantly feels like she's about to pass out. On initial evaluation she was afebrile, but was 100.7Fat home prior to taking tylenol. Tachycardic with soft BP, hypoxic to 88% on room air, improved with 2L O2, tachypneic. WBC 11.3k, flu negative, lactic acid elevated. Levaquin and IV fluids given after blood cultures drawn.She continued to be hypoxic and developed wheezing for which steroids and breathing treatments were provided. Respiratory effort increased and hypoxia worsened prompting pulmonology consultation and transfer to SDU/ICU on 11/18.BiPAP started with improvement in oxygenation. The patient did not show any improvement on BiPAP. She was intubated on 05/17/18.  Assessment/Plan: Acute hypoxic respiratory failure: -Due to multifocal pneumonia, bronchospasmandan element of interstitial edema. -Stopped vancomycin as she failed an adequate trial of doxycycline, MRSA negative. -decreasesolumedrol to 60 IV q 12 - Intubated 05/17/18; extubated 05/23/18 -received lasix 11/18,11/19, 11/21 -Care has been transitioned to full comfort care. -Appreciate palliative care consultation and recommendations -Main goal is symptomatic management.  Sepsis due to multilobar pneumonia: -Flu  negative. -RVP = rhino/entervirus -PCT declining 0.49>>>0.34>>>10.81>>>0.28 -tracheal aspirate for culture--C.albicans-->unclear clinicaly significance -continues running low grade fever -repeat blood cultureneg -repeat UA without pyuria -She completed 7 days of cefepime and also receive Levaquin -At this moment there is no acute signs of infection and she continued to be obtunded/encephalopathic.  Decision has been made to pursued symptomatic management for comfort only.  Leukocytosis -WBC increased to 55K today -pt is afebrile and hemodynamically stable -CT chest and CT abd; failed to demonstrated any acute source of infection. -Decision has been made to transition to full comfort care -No further blood draws, antibiotics or invasive treatment will be provided.  Acute metabolic Encephalopathy -CT brain demonstrated multiple strokes affecting frontal lobe, parietal lobe and cerebellum bilaterally (H is indeterminate). -Patient has remained unresponsive since extubation. -After discussing with palliative care decision has been made to transition to full comfort care.  COPD Exacerbation -pt has at least 20 pack year history of tobacco -continueas needed bronchodilators -After long discussion with palliative care decision has been made to transition to full comfort care.  FEN--Hyperkalemia/Hypernatremia -At this moment decision has been made for full comfort care and symptomatic management -NG tube has been discontinued -Plan is for comfort feeding. -Stop supplement/artificial nutrition.  HTN: -Blood pressure was overall stable -At this moment plan is for comfort care only -Stop oral medications.  Tachycardia/atrial tachycardia -questionintermittentatrial tachycardia/SVT -Troponin negative -Decision has been made for full comfort care and symptomatic management only.  Will discontinue diltiazem. -05/15/18 Echo--EF 55-60%, no WMA, normal RV -personally reviewed  EKG--sinus/atrial tachycardia, no STT changes  Hypothyroidism:  -full comfort care only -will stop synthroid.  Thrombocytosis>>>>Thrombocytopenia -d/c lovenox -SCDs  Acute on chronic renal failureStage III CKD: -Creatinine was at baseline on admission.Patient subsequently exposed to  lasix, vancomycin, and contrast during  hospitalization; also due to sepsis and hemodynamic changes -baseline creatinine 0.8-1.1 -renal consulted and recommended IVF's with PRN diuretics base on urine output. -at this time decision has been made to stop IVF's, focus on comfort care and pursuit symptomatic management.   Carotid stenosis: -Will stop oral medications are not intended for comfort care -Decision has been made for symptomatic management only -Stop aspirin.  Hyperlipidemia: -Will stop statins as patient currently unable to take by mouth and after discussing with palliative care decision is full comfort care and symptomatic management only.  Left Heel Pressure Injury -continue prevalon boots for preventive measures.   Disposition Plan:remain inICU Family Communication: Family at bedside updated11/27  Consultants:pulmonary, palliative care  Code Status: comfort care.  DVT Prophylaxis:[place] Lovenox   Procedures: As Listed in Progress Note Above  Antibiotics: vanco 11/18 levoflox 11/14>>>11/22 Cefepime 11/18>>>11/24   Subjective: Awake, currently afebrile.  She remains encephalopathic and unable to follow commands or answer any questions.  Patient was extubated on 05/23/2018 but overnight has required high flow nasal cannula supplementation  Objective: Vitals:   05/24/18 1144 05/24/18 1200 05/24/18 1300 05/24/18 1500  BP:  133/60    Pulse:  100 100 (!) 105  Resp:  17 18 (!) 22  Temp:      TempSrc:      SpO2: 94% 93% 92% 90%  Weight:      Height:        Intake/Output Summary (Last 24 hours) at 05/24/2018 1757 Last data filed at 05/24/2018  0744 Gross per 24 hour  Intake 2155.82 ml  Output 1000 ml  Net 1155.82 ml   Weight change: -3.5 kg   Exam: General exam: Awake, no following commands or actively communicate.  She is no eating or drinking. High flow nasal cannula supplementation in place. Respiratory system: Mild tachypnea, diffuse rhonchi, no using accessory muscles.  No wheezing. Cardiovascular system:RRR. No murmurs, rubs, gallops. Gastrointestinal system: Abdomen is nondistended, soft and nontender. No organomegaly or masses felt. Normal bowel sounds heard. Central nervous system: Alert and oriented. No focal neurological deficits. Extremities: No C/C/E, +pedal pulses Skin: No open wounds; deep tissue injury affecting left heel. Psychiatry: Unable to properly assess secondary to mentation.  Data Reviewed: I have personally reviewed following labs and imaging studies Basic Metabolic Panel: Recent Labs  Lab 05/18/18 0356 05/19/18 0533 05/20/18 0530 05/21/18 0419 05/22/18 0424 05/23/18 0950 05/24/18 0407  NA 141 143 145 148* 148* 145 143  143  K 4.3 4.8 4.8 5.3* 5.3* 5.9* 4.7  4.7  CL 111 112* 115* 117* 118* 117* 117*  117*  CO2 23 24 25 25 23  20* 19*  19*  GLUCOSE 195* 182* 187* 164* 198* 203* 129*  128*  BUN 32* 56* 70* 88* 114* 130* 116*  116*  CREATININE 1.21* 1.43* 1.39* 1.53* 1.98* 2.21* 2.03*  1.99*  CALCIUM 8.6* 8.6* 8.4* 8.6* 8.4* 8.3* 7.8*  7.7*  MG  --  2.6*  --   --   --   --   --   PHOS 3.7 4.8*  --   --   --   --  4.5   Liver Function Tests: Recent Labs  Lab 05/18/18 0356 05/20/18 0530 05/24/18 0407  AST 21 17  --   ALT 22 16  --   ALKPHOS 87 71  --   BILITOT 0.5 0.5  --   PROT 5.9* 5.7*  --   ALBUMIN 1.9* 1.8* 2.0*    Recent Labs  Lab 05/24/18 0407  AMMONIA 20   CBC: Recent Labs  Lab 05/19/18 0533 05/20/18 0530 05/21/18 0419 05/22/18 0424 05/23/18 0950 05/24/18 0407  WBC 24.2* 22.4* 21.2* 25.6* 44.4* 55.4*  NEUTROABS 21.6*  --  17.4*  --   --  46.4*  HGB  9.4* 8.4* 8.1* 8.3* 8.8* 8.5*  HCT 31.0* 28.0* 26.5* 27.4* 27.9* 26.7*  MCV 93.9 94.0 93.3 92.3 91.8 90.5  PLT 235 162 132* 128* 136* 99*   CBG: Recent Labs  Lab 05/23/18 1621 05/23/18 2005 05/24/18 0007 05/24/18 0352 05/24/18 0751  GLUCAP 160* 149* 160* 117* 149*   Urine analysis:    Component Value Date/Time   COLORURINE YELLOW 05/19/2018 1145   APPEARANCEUR HAZY (A) 05/19/2018 1145   LABSPEC 1.020 05/19/2018 1145   PHURINE 6.0 05/19/2018 1145   GLUCOSEU NEGATIVE 05/19/2018 1145   HGBUR LARGE (A) 05/19/2018 1145   BILIRUBINUR NEGATIVE 05/19/2018 1145   KETONESUR NEGATIVE 05/19/2018 1145   PROTEINUR 30 (A) 05/19/2018 1145   UROBILINOGEN 0.2 01/07/2010 1316   NITRITE NEGATIVE 05/19/2018 1145   LEUKOCYTESUR NEGATIVE 05/19/2018 1145   Recent Results (from the past 240 hour(s))  MRSA PCR Screening     Status: None   Collection Time: 05/15/18  7:17 AM  Result Value Ref Range Status   MRSA by PCR NEGATIVE NEGATIVE Final    Comment:        The GeneXpert MRSA Assay (FDA approved for NASAL specimens only), is one component of a comprehensive MRSA colonization surveillance program. It is not intended to diagnose MRSA infection nor to guide or monitor treatment for MRSA infections. Performed at [facility], [address] [place], [place] [address]   Respiratory Panel by PCR     Status: Abnormal   Collection Time: 05/17/18  5:45 PM  Result Value Ref Range Status   Adenovirus NOT DETECTED NOT DETECTED Final   Coronavirus 229E NOT DETECTED NOT DETECTED Final   Coronavirus HKU1 NOT DETECTED NOT DETECTED Final   Coronavirus NL63 NOT DETECTED NOT DETECTED Final   Coronavirus OC43 NOT DETECTED NOT DETECTED Final   Metapneumovirus NOT DETECTED NOT DETECTED Final   Rhinovirus / Enterovirus DETECTED (A) NOT DETECTED Final   Influenza A NOT DETECTED NOT DETECTED Final   Influenza B NOT DETECTED NOT DETECTED Final   Parainfluenza Virus 1 NOT DETECTED NOT DETECTED Final    Parainfluenza Virus 2 NOT DETECTED NOT DETECTED Final   Parainfluenza Virus 3 NOT DETECTED NOT DETECTED Final   Parainfluenza Virus 4 NOT DETECTED NOT DETECTED Final   Respiratory Syncytial Virus NOT DETECTED NOT DETECTED Final   Bordetella pertussis NOT DETECTED NOT DETECTED Final   Chlamydophila pneumoniae NOT DETECTED NOT DETECTED Final   Mycoplasma pneumoniae NOT DETECTED NOT DETECTED Final    Comment: Performed at [facility], [address] [place], [place] [address]  Culture, respiratory (non-expectorated)     Status: None   Collection Time: 05/17/18  5:45 PM  Result Value Ref Range Status   Specimen Description   Final    TRACHEAL ASPIRATE Performed at [facility], [address] [place], [place] [address]    Special Requests   Final    NONE Performed at [facility], [address], [place], [place] [address]    Gram Stain   Final    RARE WBC PRESENT, PREDOMINANTLY PMN NO ORGANISMS SEEN Performed at [facility], [address], [place], [place] [address]    Culture FEW CANDIDA ALBICANS  Final   Report  Status 05/20/2018 FINAL  Final  Culture, blood (Routine X 2) w Reflex to ID Panel     Status: None   Collection Time: 05/19/18 11:06 AM  Result Value Ref Range Status   Specimen Description BLOOD PICC LINE DRAWN BY RN  Final   Special Requests   Final    BOTTLES DRAWN AEROBIC AND ANAEROBIC Blood Culture adequate volume   Culture   Final    NO GROWTH 5 DAYS Performed at [facility], [address] [place], [place] [address]    Report Status 05/24/2018 FINAL  Final  Culture, blood (Routine X 2) w Reflex to ID Panel     Status: None   Collection Time: 05/19/18 11:09 AM  Result Value Ref Range Status   Specimen Description BLOOD LEFT HAND DRAWN BY RN  Final   Special Requests   Final    BOTTLES DRAWN AEROBIC AND ANAEROBIC Blood Culture results may not be optimal due to an excessive volume of blood received in culture bottles   Culture   Final    NO GROWTH  5 DAYS Performed at [facility], [address] [place], [place] [address]    Report Status 05/24/2018 FINAL  Final  Culture, Urine     Status: None   Collection Time: 05/19/18 11:45 AM  Result Value Ref Range Status   Specimen Description   Final    URINE, CATHETERIZED Performed at [facility], [address], [place], [place] [address]    Special Requests   Final    NONE Performed at [facility], [address] [place], [place] [address]    Culture   Final    NO GROWTH Performed at [facility], [address], [place], [place] [address]    Report Status 05/21/2018 FINAL  Final     Procedures/Studies: Ct Abdomen Pelvis Wo Contrast  Addendum Date: 05/23/2018   ADDENDUM REPORT: 05/23/2018 20:55 ADDENDUM: As mentioned in the body of the report, but not mentioned in the impression there is a small amount of pneumomediastinum. This is presumably iatrogenic. No pneumothorax identified at this time. These results will be called to the ordering clinician or representative by the Radiologist Assistant, and communication documented in the PACS or zVision Dashboard. Electronically Signed   By: Trudie Reed M.D.   On: 05/23/2018 20:55   Result Date: 05/23/2018 CLINICAL DATA:  67 year old female with difficulty breathing. Hypertension. Chronic renal failure. Possible pneumonia. EXAM: CT CHEST, ABDOMEN AND PELVIS WITHOUT CONTRAST TECHNIQUE: Multidetector CT imaging of the chest, abdomen and pelvis was performed following the standard protocol without IV contrast. COMPARISON:  Chest CT 05/14/2018. FINDINGS: CT CHEST FINDINGS Cardiovascular: Heart size is normal. There is no significant pericardial fluid, thickening or pericardial calcification. Aortic atherosclerosis. No definite coronary artery calcifications are identified. Right upper extremity PICC with tip terminating in the mid superior vena cava. Mediastinum/Nodes: Small amount of pneumomediastinum predominantly in the anterior  mediastinum. No pathologically enlarged mediastinal or hilar lymph nodes. Please note that accurate exclusion of hilar adenopathy is limited on noncontrast CT scans. Small hiatal hernia. Nasogastric tube extending into the stomach. No axillary lymphadenopathy. Lungs/Pleura: Patchy multifocal ground-glass attenuation, septal thickening and thickening of the peribronchovascular interstitium scattered throughout the lungs bilaterally, concerning for severe multilobar pneumonia. Some associated volume loss in the lower lobes of lungs bilaterally. Musculoskeletal: There are no aggressive appearing lytic or blastic lesions noted in the visualized portions of the skeleton. CT ABDOMEN PELVIS FINDINGS Hepatobiliary: Mild diffuse low attenuation throughout the  hepatic parenchyma, indicative of hepatic steatosis. No definite cystic or solid hepatic lesions are confidently identified on today's noncontrast CT examination. Moderate attenuation material in the gallbladder likely to reflect the presence of biliary sludge. No surrounding inflammatory changes to suggest an acute cholecystitis at this time. Pancreas: No definite pancreatic mass or peripancreatic fluid or inflammatory changes are noted on today's noncontrast CT examination. Spleen: Unremarkable. Adrenals/Urinary Tract: 6 mm high attenuation lesion in the lateral aspect of the interpolar region of the right kidney, and 5 mm high attenuation lesion in the posterior aspect of the upper pole the left kidney, both incompletely characterized on today's noncontrast CT examination, but statistically likely a small hemorrhagic/proteinaceous cysts. Bilateral adrenal glands are normal in appearance. No hydroureteronephrosis. Urinary bladder is completely decompressed around an indwelling Foley balloon catheter. Urinary bladder is otherwise unremarkable in appearance. Stomach/Bowel: Nasogastric tube terminating in the antrum of the stomach. Stomach is otherwise unremarkable in  appearance. No pathologic dilatation of small bowel or colon. Numerous colonic diverticulae are noted, without definite surrounding inflammatory changes to suggest an acute diverticulitis at this time (assessment is slightly limited by presence of small volume of ascites). The appendix is not confidently identified and may be surgically absent. Regardless, there are no inflammatory changes noted adjacent to the cecum to suggest the presence of an acute appendicitis at this time. Vascular/Lymphatic: Aortic atherosclerosis. No lymphadenopathy noted in the abdomen or pelvis. Reproductive: Uterus and ovaries are unremarkable in appearance. Other: Trace volume of ascites.  No pneumoperitoneum. Musculoskeletal: There are no aggressive appearing lytic or blastic lesions noted in the visualized portions of the skeleton. IMPRESSION: 1. The appearance of the lungs is compatible with severe multilobar bronchopneumonia. 2. Trace volume of ascites. 3. No other acute findings noted in the abdomen or pelvis. 4. Hepatic steatosis. 5. Intermediate attenuation material lying dependently in the gallbladder, likely to reflect biliary sludge. No findings to suggests an acute cholecystitis at this time. 6. Aortic atherosclerosis. 7. Additional incidental findings, as above. Electronically Signed: By: Trudie Reed M.D. On: 05/23/2018 20:22   Dg Chest 2 View  Result Date: 05/29/2018 CLINICAL DATA:  Recent pneumonia with persistent cough, fever, and shortness of breath. EXAM: CHEST - 2 VIEW COMPARISON:  May 08, 2018 FINDINGS: Extensive airspace disease is noted throughout much of the right upper and lower lobe regions. There is also patchy infiltrate in the left lower lobe. The degree of consolidation on the right is stable. There is slight the increased consolidation in the left base. Heart size and pulmonary vascularity are normal. No adenopathy. No bone lesions. IMPRESSION: Multifocal pneumonia. Areas of airspace opacity in  the right upper and lower lobes appears essentially stable compared to 3 days prior. Increase in infiltrate left base. Stable cardiac silhouette. No adenopathy evident. Electronically Signed   By: Bretta Bang III M.D.   On: 2018/05/29 12:57   Dg Chest 2 View  Result Date: 05/08/2018 CLINICAL DATA:  Productive cough and fever for a couple weeks, former smoker, history hypertension, cardiomyopathy EXAM: CHEST - 2 VIEW COMPARISON:  None FINDINGS: Normal heart size, mediastinal contours, and pulmonary vascularity. Scattered opacities throughout the RIGHT lung likely representing pneumonia. However these are somewhat patchy/nodular in appearance and require follow-up until resolution to exclude underlying abnormalities including tumor. Minimal atelectasis versus consolidation in LEFT lower lobe. Remaining LEFT lung clear. Elevation of RIGHT diaphragm. No pleural effusion or pneumothorax. IMPRESSION: Predominately RIGHT-sided pulmonary infiltrates consistent with pneumonia. Followup PA and lateral chest X-ray is recommended in 3-4  weeks following trial of antibiotic therapy to ensure resolution and exclude underlying malignancy. These results will be called to the ordering clinician or representative by the Radiologist Assistant, and communication documented in the PACS or zVision Dashboard. Electronically Signed   By: Ulyses SouthwardMark  Boles M.D.   On: 05/08/2018 11:45   Ct Head Wo Contrast  Result Date: 05/23/2018 CLINICAL DATA:  67 year old female with multifocal pneumonia. Initial encounter. EXAM: CT HEAD WITHOUT CONTRAST TECHNIQUE: Contiguous axial images were obtained from the base of the skull through the vertex without intravenous contrast. COMPARISON:  None. FINDINGS: Brain: Exam is motion degraded. Multiple infarcts frontal lobe, parietal lobe and cerebellum bilaterally of indeterminate age. Possibility of a tiny amount of subarachnoid blood within right frontal sulcus (series 8, image 25 and series 5, image  23) is raised. No obvious intracranial mass although evaluation is significantly limited. Vascular: Evaluation limited Skull: No skull fracture Sinuses/Orbits: No acute orbital abnormality. Air-fluid level right maxillary sinus. Other: Opacification mastoid air cells and middle ear cavities bilaterally. No obstructing lesion of eustachian tube noted. IMPRESSION: 1. Exam is motion degraded. 2. Possibility of a tiny amount of subarachnoid blood within right frontal sulcus (series 8, image 25 and series 5, image 23) is raised. With the degree of motion, it is possible this finding is related to artifact rather than true hemorrhage. 3. Multiple infarcts frontal lobe, parietal lobe and cerebellum bilaterally of indeterminate age. 4. Air-fluid level right maxillary sinus. Opacification mastoid air cells and middle ear cavities bilaterally. These results were called by telephone at the time of interpretation on 05/23/2018 at 6:44 pm to Jethro PolingElizabeth Murphy, patient's nurse , who verbally acknowledged these results. Request to call physician on call with results. Electronically Signed   By: Lacy DuverneySteven  Olson M.D.   On: 05/23/2018 18:48   Ct Chest Wo Contrast  Addendum Date: 05/23/2018   ADDENDUM REPORT: 05/23/2018 20:55 ADDENDUM: As mentioned in the body of the report, but not mentioned in the impression there is a small amount of pneumomediastinum. This is presumably iatrogenic. No pneumothorax identified at this time. These results will be called to the ordering clinician or representative by the Radiologist Assistant, and communication documented in the PACS or zVision Dashboard. Electronically Signed   By: Trudie Reedaniel  Entrikin M.D.   On: 05/23/2018 20:55   Result Date: 05/23/2018 CLINICAL DATA:  67 year old female with difficulty breathing. Hypertension. Chronic renal failure. Possible pneumonia. EXAM: CT CHEST, ABDOMEN AND PELVIS WITHOUT CONTRAST TECHNIQUE: Multidetector CT imaging of the chest, abdomen and pelvis was  performed following the standard protocol without IV contrast. COMPARISON:  Chest CT 05/14/2018. FINDINGS: CT CHEST FINDINGS Cardiovascular: Heart size is normal. There is no significant pericardial fluid, thickening or pericardial calcification. Aortic atherosclerosis. No definite coronary artery calcifications are identified. Right upper extremity PICC with tip terminating in the mid superior vena cava. Mediastinum/Nodes: Small amount of pneumomediastinum predominantly in the anterior mediastinum. No pathologically enlarged mediastinal or hilar lymph nodes. Please note that accurate exclusion of hilar adenopathy is limited on noncontrast CT scans. Small hiatal hernia. Nasogastric tube extending into the stomach. No axillary lymphadenopathy. Lungs/Pleura: Patchy multifocal ground-glass attenuation, septal thickening and thickening of the peribronchovascular interstitium scattered throughout the lungs bilaterally, concerning for severe multilobar pneumonia. Some associated volume loss in the lower lobes of lungs bilaterally. Musculoskeletal: There are no aggressive appearing lytic or blastic lesions noted in the visualized portions of the skeleton. CT ABDOMEN PELVIS FINDINGS Hepatobiliary: Mild diffuse low attenuation throughout the hepatic parenchyma, indicative of hepatic steatosis. No  definite cystic or solid hepatic lesions are confidently identified on today's noncontrast CT examination. Moderate attenuation material in the gallbladder likely to reflect the presence of biliary sludge. No surrounding inflammatory changes to suggest an acute cholecystitis at this time. Pancreas: No definite pancreatic mass or peripancreatic fluid or inflammatory changes are noted on today's noncontrast CT examination. Spleen: Unremarkable. Adrenals/Urinary Tract: 6 mm high attenuation lesion in the lateral aspect of the interpolar region of the right kidney, and 5 mm high attenuation lesion in the posterior aspect of the upper  pole the left kidney, both incompletely characterized on today's noncontrast CT examination, but statistically likely a small hemorrhagic/proteinaceous cysts. Bilateral adrenal glands are normal in appearance. No hydroureteronephrosis. Urinary bladder is completely decompressed around an indwelling Foley balloon catheter. Urinary bladder is otherwise unremarkable in appearance. Stomach/Bowel: Nasogastric tube terminating in the antrum of the stomach. Stomach is otherwise unremarkable in appearance. No pathologic dilatation of small bowel or colon. Numerous colonic diverticulae are noted, without definite surrounding inflammatory changes to suggest an acute diverticulitis at this time (assessment is slightly limited by presence of small volume of ascites). The appendix is not confidently identified and may be surgically absent. Regardless, there are no inflammatory changes noted adjacent to the cecum to suggest the presence of an acute appendicitis at this time. Vascular/Lymphatic: Aortic atherosclerosis. No lymphadenopathy noted in the abdomen or pelvis. Reproductive: Uterus and ovaries are unremarkable in appearance. Other: Trace volume of ascites.  No pneumoperitoneum. Musculoskeletal: There are no aggressive appearing lytic or blastic lesions noted in the visualized portions of the skeleton. IMPRESSION: 1. The appearance of the lungs is compatible with severe multilobar bronchopneumonia. 2. Trace volume of ascites. 3. No other acute findings noted in the abdomen or pelvis. 4. Hepatic steatosis. 5. Intermediate attenuation material lying dependently in the gallbladder, likely to reflect biliary sludge. No findings to suggests an acute cholecystitis at this time. 6. Aortic atherosclerosis. 7. Additional incidental findings, as above. Electronically Signed: By: Trudie Reed M.D. On: 05/23/2018 20:22   Ct Angio Chest Pe W Or Wo Contrast  Result Date: 05/14/2018 CLINICAL DATA:  Shortness of breath worsening  today. Clinical concern for pulmonary embolus. Positive D-dimer. EXAM: CT ANGIOGRAPHY CHEST WITH CONTRAST TECHNIQUE: Multidetector CT imaging of the chest was performed using the standard protocol during bolus administration of intravenous contrast. Multiplanar CT image reconstructions and MIPs were obtained to evaluate the vascular anatomy. CONTRAST:  75mL ISOVUE-370 IOPAMIDOL (ISOVUE-370) INJECTION 76% COMPARISON:  None FINDINGS: Cardiovascular: The heart size is normal. No substantial pericardial effusion. No thoracic aortic aneurysm. No filling defect in the opacified pulmonary arteries to suggest the presence of an acute pulmonary embolus. Mediastinum/Nodes: 14 mm short axis subcarinal lymph node is associated with mild bilateral hilar lymphadenopathy. The esophagus has normal imaging features. There is no axillary lymphadenopathy. Lungs/Pleura: The central tracheobronchial airways are patent. Asymmetric patchy airspace disease, right greater than left is associated with interlobular septal thickening. Confluent airspace disease noted in the right lower lobe posterior left lower lobe. Small to moderate right pleural effusion evident with small left pleural effusion. Upper Abdomen: The liver shows diffusely decreased attenuation suggesting steatosis. Musculoskeletal: No worrisome lytic or sclerotic osseous abnormality. Review of the MIP images confirms the above findings. IMPRESSION: 1. No CT evidence for acute pulmonary embolus. 2. Bilateral patchy, right greater than left airspace disease compatible with multifocal pneumonia. 3. Mild mediastinal and bilateral hilar lymphadenopathy. 4. Small to moderate bilateral pleural effusions.  The Electronically Signed   By: Minerva Areola  Molli Posey M.D.   On: 05/14/2018 17:35   US Renal  Result Date: 05/22/2018 CLINICAL DATA:  67 year old female with acute on chronic renal failure. Initial encounter. EXAM: RENAL / URINARY TRACT ULTRASOUND COMPLETE COMPARISON:  None. FINDINGS:  Right Kidney: Renal measurements: 8.3 x 3.3 x 3.9 cm = volume: 54.8 mL. Echogenicity within normal limits. Right upper pole 1.1 cm cyst suspected. No hydronephrosis. Small amount of perinephric fluid lower pole region. Left Kidney: Renal measurements: 7.9 x 3.9 x 4.1 cm = volume: 67.3 mL. Echogenicity within normal limits. No left renal mass noted. Small amount of perinephric fluid lower pole region. Bladder: Decompressed by Foley catheter not visualized. Liver of increased echogenicity consistent with fatty infiltration and/or hepatocellular disease. IMPRESSION: 1. No hydronephrosis. 2. Small amount of perinephric fluid lower pole region bilaterally. Etiology indeterminate. 3. Right upper pole 1.1 cm cyst suspected. 4. Bladder not visualized as decompressed with Foley catheter. 5. Liver of increased echogenicity consistent with fatty infiltration and/or hepatocellular disease. Electronically Signed   By: Lacy Duverney M.D.   On: 05/22/2018 14:04   Dg Chest Port 1 View  Result Date: 05/23/2018 CLINICAL DATA:  NG tube placement. EXAM: PORTABLE CHEST 1 VIEW COMPARISON:  05/23/2018 FINDINGS: NG tube noted with tip over the stomach. PICC line noted with tip over superior vena cava. Heart size normal. Diffuse bilateral pulmonary interstitial prominence. Interstitial edema/pneumonitis could present this fashion. Tiny bilateral pleural effusions cannot be excluded. No pneumothorax. IMPRESSION: 1. NG tube noted with tip in the stomach. PICC line noted with tip over superior vena cava. 2. Bilateral interstitial prominence. Edema/pneumonitis could present this fashion. Small bilateral pleural effusions cannot be excluded. Electronically Signed   By: Maisie Fus  Register   On: 05/23/2018 15:00   Dg Chest Port 1 View  Result Date: 05/23/2018 CLINICAL DATA:  Respiratory failure EXAM: PORTABLE CHEST 1 VIEW COMPARISON:  05/22/2018 FINDINGS: Cardiac shadows within normal limits for portable technique. Right-sided PICC line,  endotracheal tube and nasogastric catheter are noted. Persistent atelectasis/infiltrate is noted in the right lung base. The overall inspiratory effort has decreased when compare with the prior exam. Some patient rotation accentuates the mediastinal markings although no other focal infiltrate is seen. No bony abnormality is noted. IMPRESSION: Persistent opacity in the right lung base. Tubes and lines as described. Electronically Signed   By: Alcide Clever M.D.   On: 05/23/2018 07:37   Dg Chest Port 1 View  Result Date: 05/22/2018 CLINICAL DATA:  Respiratory failure EXAM: PORTABLE CHEST 1 VIEW COMPARISON:  05/21/2018 FINDINGS: Cardiac shadow is stable. Endotracheal tube and nasogastric catheter is well as a right-sided PICC line are again seen in stable position. The lungs are well aerated bilaterally. Improved aeration in the left lung base and right upper lobe is noted. Persistent airspace opacity in the right lung base is seen. No sizable effusion is noted. IMPRESSION: Overall improved aeration with some persistent opacity in the right base. Electronically Signed   By: Alcide Clever M.D.   On: 05/22/2018 09:52   Dg Chest Port 1 View  Result Date: 05/21/2018 CLINICAL DATA:  Respiratory failure, ventilatory support EXAM: PORTABLE CHEST 1 VIEW COMPARISON:  05/20/2018 FINDINGS: Endotracheal tube 4.4 cm above the carina. Exam is rotated to the right. Right PICC line tip upper SVC level. NG tube enters the proximal stomach. Minimal improvement in asymmetric patchy bilateral airspace disease/pneumonia involving the right upper lobe and both lower lobes. No enlarging effusion by plain radiography. No large pneumothorax. Overall stable exam compared to yesterday.  IMPRESSION: Stable support apparatus Persistent bilateral patchy airspace process, minimal improvement, more compatible with multifocal pneumonia by comparison CT Electronically Signed   By: Judie Petit.  Shick M.D.   On: 05/21/2018 08:50   Dg Chest Port 1  View  Result Date: 05/20/2018 CLINICAL DATA:  Hypoxia.  Acute respiratory failure EXAM: PORTABLE CHEST 1 VIEW COMPARISON:  Chest radiograph 05/19/2018 FINDINGS: ET tube terminates in the mid trachea. Right upper extremity PICC line tip projects over the superior vena cava. Enteric tube courses inferior to the diaphragm. Monitoring leads overlie the patient. Stable cardiac and mediastinal contours. Similar-appearing bilateral interstitial pulmonary opacities. No pleural effusion or pneumothorax. IMPRESSION: Support apparatus as above. Similar-appearing interstitial opacities favored to represent edema. Electronically Signed   By: Annia Belt M.D.   On: 05/20/2018 08:58   Dg Chest Port 1 View  Result Date: 05/19/2018 CLINICAL DATA:  Nasogastric tube placement. EXAM: PORTABLE CHEST 1 VIEW COMPARISON:  Radiograph of May 18, 2018. FINDINGS: Stable cardiomediastinal silhouette. Endotracheal and nasogastric tubes are unchanged in position. Right-sided PICC line is unchanged in position. No pneumothorax is noted. Mildly improved bilateral lung opacities are noted most consistent with improving edema. Small pleural effusions may be present. Bony thorax is unremarkable. IMPRESSION: Stable support apparatus. Probable mildly improved bilateral pulmonary edema. Electronically Signed   By: Lupita Raider, M.D.   On: 05/19/2018 10:23   Dg Chest Port 1 View  Result Date: 05/18/2018 CLINICAL DATA:  Recent pneumonia EXAM: PORTABLE CHEST 1 VIEW COMPARISON:  Yesterday FINDINGS: Endotracheal tube tip at the clavicular heads. An orogastric tube reaches the stomach at least. Right upper extremity PICC in the interim with tip at the SVC. Extensive interstitial and airspace opacity with a patchy appearance. No visible effusion or pneumothorax. Normal heart size. Artifact from EKG leads. IMPRESSION: 1. Unremarkable hardware positioning. 2. Multifocal pneumonia with possible edema. Electronically Signed   By: Marnee Spring M.D.   On: 05/18/2018 07:18   Portable Chest X-ray  Result Date: 05/17/2018 CLINICAL DATA:  Evaluate endotracheal tube placement EXAM: PORTABLE CHEST 1 VIEW COMPARISON:  Portable chest x-ray of 05/17/2017 FINDINGS: Endotracheal tube has now been placed with the tip approximately 2.7 cm above the carina. There is little change in patchy lung opacities greatest in the right mid and upper lung which appears most typical of pneumonia. However as noted on the prior dictation, superimposed edema cannot be excluded. Mild cardiomegaly is stable. IMPRESSION: 1. Tip of endotracheal tube 2.7 cm above the carina. 2. Little change in lung opacities as noted previously suspicious for pneumonia and possibly superimposed edema. Electronically Signed   By: Dwyane Dee M.D.   On: 05/17/2018 10:12   Dg Chest Port 1 View  Result Date: 05/17/2018 CLINICAL DATA:  Respiratory failure EXAM: PORTABLE CHEST 1 VIEW COMPARISON:  05/16/2018 FINDINGS: Right upper lobe and left lower lobe opacities, suspicious for pneumonia. Superimposed interstitial opacities, suspicious for interstitial edema. No definite pleural effusions. The heart is normal in size. No pneumothorax. IMPRESSION: Multifocal opacities, right upper lobe predominant, suspicious for pneumonia. Superimposed interstitial edema is also suspected. Overall appearance is similar to the prior. Electronically Signed   By: Charline Bills M.D.   On: 05/17/2018 09:12   Dg Chest Port 1 View  Result Date: 05/16/2018 CLINICAL DATA:  Respiratory failure EXAM: PORTABLE CHEST 1 VIEW COMPARISON:  CTA chest dated 05/14/2018 FINDINGS: Multifocal patchy opacities, right upper lobe predominant, suspicious for pneumonia. Superimposed mild interstitial edema is possible. No definite pleural effusion. No pneumothorax. The  heart is normal in size. IMPRESSION: Multifocal patchy opacities, right upper lobe predominant, suspicious for pneumonia. Superimposed mild interstitial edema is  possible. These findings are better evaluated on recent CT. Electronically Signed   By: Charline Bills M.D.   On: 05/16/2018 09:32   [place] Ekg Site Rite  Result Date: 05/17/2018 If Site Rite image not attached, placement could not be confirmed due to current cardiac rhythm.   Vassie Loll, MD  [facility] Pager [phone]  If 7PM-7AM, please contact night-coverage [email or web address] Password [email or web address] 05/24/2018, 5:57 PM   LOS: 12 days

## 2018-05-24 NOTE — Progress Notes (Signed)
Jasmine Black  MRN: [id number]  DOB/AGE: 10/21/1950 67 y.o.  Primary Care Physician:Fusco, Lyman Bishop, MD  Admit date: 05/05/2018  Chief Complaint:  Chief Complaint  Patient presents with  . Shortness of Breath    S-Pt presented on  05/13/2018 with  Chief Complaint  Patient presents with  . Shortness of Breath  .    Pt is resting comfortably.    Meds . budesonide (PULMICORT) nebulizer solution  0.5 mg Nebulization BID  . Chlorhexidine Gluconate Cloth  6 each Topical Daily  . diltiazem  60 mg Oral Q6H  . docusate  100 mg Per Tube BID  . feeding supplement (PRO-STAT SUGAR FREE 64)  30 mL Per Tube Daily  . insulin aspart  2-6 Units Subcutaneous Q4H  . ipratropium  0.5 mg Nebulization Q6H  . levalbuterol  1.25 mg Nebulization Q6H  . levothyroxine  37.5 mcg Intravenous Daily  . mouth rinse  15 mL Mouth Rinse BID  . methylPREDNISolone (SOLU-MEDROL) injection  60 mg Intravenous Q12H  . metoprolol tartrate  2.5 mg Intravenous Q6H  . pantoprazole (PROTONIX) IV  40 mg Intravenous Q24H  . sodium chloride flush  10-40 mL Intracatheter Q12H  . sodium chloride flush  3 mL Intravenous Q12H  . sodium zirconium cyclosilicate  10 g Oral BID       Physical Exam: Vital signs in last 24 hours: Temp:  [98.1 F (36.7 C)-99.8 F (37.7 C)] 98.2 F (36.8 C) (11/27 0757) Pulse Rate:  [87-129] 87 (11/27 0757) Resp:  [17-31] 17 (11/27 0757) BP: (111-149)/(51-77) 127/63 (11/27 0700) SpO2:  [89 %-98 %] 94 % (11/27 0757) FiO2 (%):  [100 %] 100 % (11/27 0233) Weight:  [76 kg] 76 kg (11/27 0500) Weight change: -3.5 kg Last BM Date: 05/14/18  Intake/Output from previous day: 11/26 0701 - 11/27 0700 In: 5680.8 [I.V.:2844.5; WU/JW:1191.4; IV Piggyback:999] Out: 2300 [Urine:2300] Total I/O In: 98.5 [I.V.:98.5] Out: -    Physical Exam: General- pt is awake,resting comfortably. Resp- No acute REsp distress, Decreased at bases. CVS- S1S2 regular in rate and rhythm GIT- BS+, soft, NT,  ND EXT- 1+ LE Edema,  No Cyanosis   Lab Results: CBC Recent Labs    05/23/18 0950 05/24/18 0407  WBC 44.4* 55.4*  HGB 8.8* 8.5*  HCT 27.9* 26.7*  PLT 136* 99*    BMET Recent Labs    05/23/18 0950 05/24/18 0407  NA 145 143  143  K 5.9* 4.7  4.7  CL 117* 117*  117*  CO2 20* 19*  19*  GLUCOSE 203* 129*  128*  BUN 130* 116*  116*  CREATININE 2.21* 2.03*  1.99*  CALCIUM 8.3* 7.8*  7.7*   Creat trend 2019  1.2=>2.2=>2.0 2014-2015  1.1--1.5 2010-2011   1.1-- 1.65  MICRO Recent Results (from the past 240 hour(s))  MRSA PCR Screening     Status: None   Collection Time: 05/15/18  7:17 AM  Result Value Ref Range Status   MRSA by PCR NEGATIVE NEGATIVE Final    Comment:        The GeneXpert MRSA Assay (FDA approved for NASAL specimens only), is one component of a comprehensive MRSA colonization surveillance program. It is not intended to diagnose MRSA infection nor to guide or monitor treatment for MRSA infections. Performed at [facility], [address] [place], [place] [address]   Respiratory Panel by PCR     Status: Abnormal   Collection Time: 05/17/18  5:45 PM  Result Value Ref Range Status  Adenovirus NOT DETECTED NOT DETECTED Final   Coronavirus 229E NOT DETECTED NOT DETECTED Final   Coronavirus HKU1 NOT DETECTED NOT DETECTED Final   Coronavirus NL63 NOT DETECTED NOT DETECTED Final   Coronavirus OC43 NOT DETECTED NOT DETECTED Final   Metapneumovirus NOT DETECTED NOT DETECTED Final   Rhinovirus / Enterovirus DETECTED (A) NOT DETECTED Final   Influenza A NOT DETECTED NOT DETECTED Final   Influenza B NOT DETECTED NOT DETECTED Final   Parainfluenza Virus 1 NOT DETECTED NOT DETECTED Final   Parainfluenza Virus 2 NOT DETECTED NOT DETECTED Final   Parainfluenza Virus 3 NOT DETECTED NOT DETECTED Final   Parainfluenza Virus 4 NOT DETECTED NOT DETECTED Final   Respiratory Syncytial Virus NOT DETECTED NOT DETECTED Final   Bordetella pertussis NOT  DETECTED NOT DETECTED Final   Chlamydophila pneumoniae NOT DETECTED NOT DETECTED Final   Mycoplasma pneumoniae NOT DETECTED NOT DETECTED Final    Comment: Performed at [facility], [address] [place], [place] [address]  Culture, respiratory (non-expectorated)     Status: None   Collection Time: 05/17/18  5:45 PM  Result Value Ref Range Status   Specimen Description   Final    TRACHEAL ASPIRATE Performed at [facility], [address], [place], [place] [address]    Special Requests   Final    NONE Performed at [facility], [address] [place], [place] [address]    Gram Stain   Final    RARE WBC PRESENT, PREDOMINANTLY PMN NO ORGANISMS SEEN Performed at [facility], [address], [place], [place] [address]    Culture FEW CANDIDA ALBICANS  Final   Report Status 05/20/2018 FINAL  Final  Culture, blood (Routine X 2) w Reflex to ID Panel     Status: None   Collection Time: 05/19/18 11:06 AM  Result Value Ref Range Status   Specimen Description BLOOD PICC LINE DRAWN BY RN  Final   Special Requests   Final    BOTTLES DRAWN AEROBIC AND ANAEROBIC Blood Culture adequate volume   Culture   Final    NO GROWTH 5 DAYS Performed at [facility], [address], [place], [place] [address]    Report Status 05/24/2018 FINAL  Final  Culture, blood (Routine X 2) w Reflex to ID Panel     Status: None   Collection Time: 05/19/18 11:09 AM  Result Value Ref Range Status   Specimen Description BLOOD LEFT HAND DRAWN BY RN  Final   Special Requests   Final    BOTTLES DRAWN AEROBIC AND ANAEROBIC Blood Culture results may not be optimal due to an excessive volume of blood received in culture bottles   Culture   Final    NO GROWTH 5 DAYS Performed at [facility], [address], [place], [place] [address]    Report Status 05/24/2018 FINAL  Final  Culture, Urine     Status: None   Collection Time: 05/19/18 11:45 AM  Result Value Ref Range Status   Specimen Description   Final     URINE, CATHETERIZED Performed at [facility], [address], [place], [place] [address]    Special Requests   Final    NONE Performed at [facility], [address], [place], [place] [address]    Culture   Final    NO GROWTH Performed at [facility], [address] [place], [place] [address]    Report Status 05/21/2018 FINAL  Final  Lab Results  Component Value Date   CALCIUM 7.7 (L) 05/24/2018   CALCIUM 7.8 (L) 05/24/2018   PHOS 4.5 05/24/2018   Alb 2.0            Impression: 1)Renal  AKI secondary to ATN               AKI on CKD               CKD stage 3 .               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 Calcium when corrected for low albumin is at goal.  5)ID-admitted with Multifocal Pneumonia Primary MD following  6)Electrolytes Normokalemic NOrmonatremic   7)Acid base Co2 at goal  8) Social-Pt family discussing palliative care   Plan:   Will continue current care    BHUTANI,MANPREET S 05/24/2018, 9:18 AM

## 2018-05-24 NOTE — Progress Notes (Signed)
Notified [facility] of patient condition. Spoke with Cammy BrochureLatoshia Moore and obtained referral number [phone] [id number]. Jacqulyn CaneLatoshia states that patient is not suitable for eye or organ donation due to sepsis from multilobar pneumonia.

## 2018-05-24 NOTE — Progress Notes (Signed)
Subjective: Events noted.  She was able to be taken off the ventilator yesterday but has not been responsive.  Oxygen saturation dropped last night and she is now on high flow nasal cannula.  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  Eyes are open.  On nasal cannula Resp: rhonchi bilaterally Cardio: regular rate and rhythm, S1, S2 normal, no murmur, click, rub or gallop GI: soft, non-tender; bowel sounds normal; no masses,  no organomegaly Extremities: extremities normal, atraumatic, no cyanosis or edema  Lab Results:  Results for orders placed or performed during the hospital encounter of 05/16/2018 (from the past 48 hour(s))  Sodium, urine, random     Status: None   Collection Time: 05/22/18 11:15 AM  Result Value Ref Range   Sodium, Ur 22 mmol/L    Comment: Performed at [facility], [address], [place], [place] [address]  Creatinine, urine, random     Status: None   Collection Time: 05/22/18 11:15 AM  Result Value Ref Range   Creatinine, Urine 43.08 mg/dL    Comment: Performed at [facility], [address], [place], [place] [address]  Glucose, capillary     Status: Abnormal   Collection Time: 05/22/18 11:47 AM  Result Value Ref Range   Glucose-Capillary 208 (H) 70 - 99 mg/dL  Glucose, capillary     Status: Abnormal   Collection Time: 05/22/18  4:07 PM  Result Value Ref Range   Glucose-Capillary 197 (H) 70 -  99 mg/dL  Glucose, capillary     Status: Abnormal   Collection Time: 05/22/18  8:18 PM  Result Value Ref Range   Glucose-Capillary 216 (H) 70 - 99 mg/dL  Glucose, capillary     Status: Abnormal   Collection Time: 05/23/18 12:01 AM  Result Value Ref Range   Glucose-Capillary 197 (H) 70 - 99 mg/dL  Glucose, capillary     Status: Abnormal   Collection Time: 05/23/18  4:00 AM  Result Value Ref Range   Glucose-Capillary 140 (H) 70 - 99 mg/dL  Procalcitonin     Status: None   Collection Time: 05/23/18  4:12 AM  Result Value Ref Range   Procalcitonin 0.26 ng/mL    Comment:        Interpretation: PCT (Procalcitonin) <= 0.5 ng/mL: Systemic infection (sepsis) is not likely. Local bacterial infection is possible. (NOTE)       Sepsis PCT Algorithm           Lower Respiratory Tract                                      Infection PCT Algorithm    ----------------------------     ----------------------------         PCT < 0.25 ng/mL  PCT < 0.10 ng/mL         Strongly encourage             Strongly discourage   discontinuation of antibiotics    initiation of antibiotics    ----------------------------     -----------------------------       PCT 0.25 - 0.50 ng/mL            PCT 0.10 - 0.25 ng/mL               OR       >80% decrease in PCT            Discourage initiation of                                            antibiotics      Encourage discontinuation           of antibiotics    ----------------------------     -----------------------------         PCT >= 0.50 ng/mL              PCT 0.26 - 0.50 ng/mL               AND        <80% decrease in PCT             Encourage initiation of                                             antibiotics       Encourage continuation           of antibiotics    ----------------------------     -----------------------------        PCT >= 0.50 ng/mL                  PCT > 0.50 ng/mL               AND         increase in PCT                   Strongly encourage                                      initiation of antibiotics    Strongly encourage escalation           of antibiotics                                     -----------------------------                                           PCT <= 0.25 ng/mL                                                 OR                                        >  80% decrease in PCT                                     Discontinue / Do not initiate                                             antibiotics Performed at [facility], [address] [place], [place] [address]   Blood gas, arterial     Status: Abnormal   Collection Time: 05/23/18  5:14 AM  Result Value Ref Range   FIO2 45.00    Delivery systems VENTILATOR    Mode PRESSURE REGULATED VOLUME CONTROL    VT 600 mL   LHR 22 resp/min   Peep/cpap 5.0 cm H20   pH, Arterial 7.351 7.350 - 7.450   pCO2 arterial 37.8 32.0 - 48.0 mmHg   pO2, Arterial 84.8 83.0 - 108.0 mmHg   Bicarbonate 20.8 20.0 - 28.0 mmol/L   Acid-base deficit 4.3 (H) 0.0 - 2.0 mmol/L   O2 Saturation 95.9 %   Patient temperature 37.0    Collection site LEFT RADIAL    Drawn by [id number]    Sample type ARTERIAL DRAW    Allens test (pass/fail) PASS PASS    Comment: Performed at [facility], [address] [place], [place] [address]  Glucose, capillary     Status: Abnormal   Collection Time: 05/23/18  7:53 AM  Result Value Ref Range   Glucose-Capillary 155 (H) 70 - 99 mg/dL  Basic metabolic panel     Status: Abnormal   Collection Time: 05/23/18  9:50 AM  Result Value Ref Range   Sodium 145 135 - 145 mmol/L   Potassium 5.9 (H) 3.5 - 5.1 mmol/L   Chloride 117 (H) 98 - 111 mmol/L   CO2 20 (L) 22 - 32 mmol/L   Glucose, Bld 203 (H) 70 - 99 mg/dL   BUN 147 (H) 8 - 23 mg/dL    Comment: RESULTS CONFIRMED BY MANUAL DILUTION   Creatinine, Ser 2.21 (H) 0.44 - 1.00 mg/dL   Calcium 8.3 (L) 8.9 - 10.3 mg/dL   GFR calc non Af Amer 22 (L) >60 mL/min   GFR calc Af Amer 26 (L) >60  mL/min   Anion gap 8 5 - 15    Comment: Performed at [facility], [address] [place], [place] [address]  CBC     Status: Abnormal   Collection Time: 05/23/18  9:50 AM  Result Value Ref Range   WBC 44.4 (H) 4.0 - 10.5 K/uL   RBC 3.04 (L) 3.87 - 5.11 MIL/uL   Hemoglobin 8.8 (L) 12.0 - 15.0 g/dL   HCT 21.3 (L) 08.6 - 57.8 %   MCV 91.8 80.0 - 100.0 fL   MCH 28.9 26.0 - 34.0 pg   MCHC 31.5 30.0 - 36.0 g/dL   RDW 46.9 62.9 - 52.8 %   Platelets 136 (L) 150 - 400 K/uL   nRBC 1.0 (H) 0.0 - 0.2 %    Comment: Performed at [facility], [address], [place], [place] [address]  Blood gas, arterial     Status: Abnormal   Collection Time: 05/23/18 11:13 AM  Result Value Ref Range   FIO2 0.45    Delivery systems VENTILATOR    Mode CONTINUOUS POSITIVE AIRWAY  PRESSURE    Peep/cpap 5.0 cm H20   Pressure support 5.0 cm H20   pH, Arterial 7.373 7.350 - 7.450   pCO2 arterial 35.3 32.0 - 48.0 mmHg   pO2, Arterial 69.6 (L) 83.0 - 108.0 mmHg   Bicarbonate 20.9 20.0 - 28.0 mmol/L   Acid-base deficit 4.3 (H) 0.0 - 2.0 mmol/L   O2 Saturation 93.3 %   Patient temperature 37.0    Collection site LEFT RADIAL    Drawn by [id number]    Sample type ARTERIAL DRAW    Allens test (pass/fail) PASS PASS    Comment: Performed at [facility], [address] [place], [place] [address]  Glucose, capillary     Status: Abnormal   Collection Time: 05/23/18 11:26 AM  Result Value Ref Range   Glucose-Capillary 207 (H) 70 - 99 mg/dL  Glucose, capillary     Status: Abnormal   Collection Time: 05/23/18  4:21 PM  Result Value Ref Range   Glucose-Capillary 160 (H) 70 - 99 mg/dL  Glucose, capillary     Status: Abnormal   Collection Time: 05/23/18  8:05 PM  Result Value Ref Range   Glucose-Capillary 149 (H) 70 - 99 mg/dL  Glucose, capillary     Status: Abnormal   Collection Time: 05/24/18 12:07 AM  Result Value Ref Range   Glucose-Capillary 160 (H) 70 - 99 mg/dL  Glucose, capillary     Status: Abnormal    Collection Time: 05/24/18  3:52 AM  Result Value Ref Range   Glucose-Capillary 117 (H) 70 - 99 mg/dL  Basic metabolic panel     Status: Abnormal   Collection Time: 05/24/18  4:07 AM  Result Value Ref Range   Sodium 143 135 - 145 mmol/L   Potassium 4.7 3.5 - 5.1 mmol/L    Comment: DELTA CHECK NOTED   Chloride 117 (H) 98 - 111 mmol/L   CO2 19 (L) 22 - 32 mmol/L   Glucose, Bld 128 (H) 70 - 99 mg/dL   BUN 098 (H) 8 - 23 mg/dL    Comment: RESULTS CONFIRMED BY MANUAL DILUTION   Creatinine, Ser 1.99 (H) 0.44 - 1.00 mg/dL   Calcium 7.7 (L) 8.9 - 10.3 mg/dL   GFR calc non Af Amer 25 (L) >60 mL/min   GFR calc Af Amer 29 (L) >60 mL/min   Anion gap 7 5 - 15    Comment: Performed at [facility], [address], [place], [place] [address]  CBC with Differential/Platelet     Status: Abnormal   Collection Time: 05/24/18  4:07 AM  Result Value Ref Range   WBC 55.4 (HH) 4.0 - 10.5 K/uL    Comment: REPEATED TO VERIFY WHITE COUNT CONFIRMED ON SMEAR THIS CRITICAL RESULT HAS VERIFIED AND BEEN CALLED TO J SMITH,RN BY MARIE KELLY ON 11 27 2019 AT 0510, AND HAS BEEN READ BACK. CRITICAL RESULT VERIFIED    RBC 2.95 (L) 3.87 - 5.11 MIL/uL   Hemoglobin 8.5 (L) 12.0 - 15.0 g/dL   HCT 78.2 (L) 95.6 - 21.3 %   MCV 90.5 80.0 - 100.0 fL   MCH 28.8 26.0 - 34.0 pg   MCHC 31.8 30.0 - 36.0 g/dL   RDW 08.6 57.8 - 46.9 %   Platelets 99 (L) 150 - 400 K/uL    Comment: PLATELET COUNT CONFIRMED BY SMEAR SPECIMEN CHECKED FOR CLOTS    nRBC 0.5 (H) 0.0 - 0.2 %   Neutrophils Relative % 84 %   Neutro Abs 46.4 (H)  1.7 - 7.7 K/uL   Lymphocytes Relative 7 %   Lymphs Abs 3.8 0.7 - 4.0 K/uL   Monocytes Relative 3 %   Monocytes Absolute 1.9 (H) 0.1 - 1.0 K/uL   Eosinophils Relative 0 %   Eosinophils Absolute 0.1 0.0 - 0.5 K/uL   Basophils Relative 0 %   Basophils Absolute 0.0 0.0 - 0.1 K/uL   WBC Morphology MILD LEFT SHIFT (1-5% METAS, OCC MYELO, OCC BANDS)     Comment: TOXIC GRANULATION   Immature Granulocytes 6 %    Abs Immature Granulocytes 3.19 (H) 0.00 - 0.07 K/uL    Comment: Performed at [facility], [address], [place], [place] [address]  Renal function panel     Status: Abnormal   Collection Time: 05/24/18  4:07 AM  Result Value Ref Range   Sodium 143 135 - 145 mmol/L   Potassium 4.7 3.5 - 5.1 mmol/L    Comment: DELTA CHECK NOTED   Chloride 117 (H) 98 - 111 mmol/L   CO2 19 (L) 22 - 32 mmol/L   Glucose, Bld 129 (H) 70 - 99 mg/dL   BUN 604 (H) 8 - 23 mg/dL    Comment: RESULTS CONFIRMED BY MANUAL DILUTION   Creatinine, Ser 2.03 (H) 0.44 - 1.00 mg/dL   Calcium 7.8 (L) 8.9 - 10.3 mg/dL   Phosphorus 4.5 2.5 - 4.6 mg/dL   Albumin 2.0 (L) 3.5 - 5.0 g/dL   GFR calc non Af Amer 24 (L) >60 mL/min   GFR calc Af Amer 28 (L) >60 mL/min   Anion gap 7 5 - 15    Comment: Performed at [facility], [address] [place], [place] [address]  Ammonia     Status: None   Collection Time: 05/24/18  4:07 AM  Result Value Ref Range   Ammonia 20 9 - 35 umol/L    Comment: Performed at [facility], [address], [place], [place] [address]  Blood gas, arterial     Status: Abnormal   Collection Time: 05/24/18  5:30 AM  Result Value Ref Range   FIO2 100.00    Delivery systems NON-REBREATHER OXYGEN MASK    pH, Arterial 7.361 7.350 - 7.450   pCO2 arterial 35.0 32.0 - 48.0 mmHg   pO2, Arterial 86.7 83.0 - 108.0 mmHg   Bicarbonate 20.3 20.0 - 28.0 mmol/L   Acid-base deficit 5.1 (H) 0.0 - 2.0 mmol/L   O2 Saturation 95.8 %   Patient temperature 37.0    Collection site LEFT RADIAL    Drawn by [id number]    Sample type ARTERIAL    Allens test (pass/fail) PASS PASS    Comment: Performed at [facility], [address] [place], [place] [address]  Glucose, capillary     Status: Abnormal   Collection Time: 05/24/18  7:51 AM  Result Value Ref Range   Glucose-Capillary 149 (H) 70 - 99 mg/dL    ABGS Recent Labs    05/24/18 0530  PHART 7.361  PO2ART 86.7  HCO3 20.3   CULTURES Recent Results (from the past 240  hour(s))  MRSA PCR Screening     Status: None   Collection Time: 05/15/18  7:17 AM  Result Value Ref Range Status   MRSA by PCR NEGATIVE NEGATIVE Final    Comment:        The GeneXpert MRSA Assay (FDA approved for NASAL specimens only), is one component of a comprehensive MRSA colonization surveillance program. It is not intended to diagnose MRSA infection  nor to guide or monitor treatment for MRSA infections. Performed at [facility], [address], [place], [place] [address]   Respiratory Panel by PCR     Status: Abnormal   Collection Time: 05/17/18  5:45 PM  Result Value Ref Range Status   Adenovirus NOT DETECTED NOT DETECTED Final   Coronavirus 229E NOT DETECTED NOT DETECTED Final   Coronavirus HKU1 NOT DETECTED NOT DETECTED Final   Coronavirus NL63 NOT DETECTED NOT DETECTED Final   Coronavirus OC43 NOT DETECTED NOT DETECTED Final   Metapneumovirus NOT DETECTED NOT DETECTED Final   Rhinovirus / Enterovirus DETECTED (A) NOT DETECTED Final   Influenza A NOT DETECTED NOT DETECTED Final   Influenza B NOT DETECTED NOT DETECTED Final   Parainfluenza Virus 1 NOT DETECTED NOT DETECTED Final   Parainfluenza Virus 2 NOT DETECTED NOT DETECTED Final   Parainfluenza Virus 3 NOT DETECTED NOT DETECTED Final   Parainfluenza Virus 4 NOT DETECTED NOT DETECTED Final   Respiratory Syncytial Virus NOT DETECTED NOT DETECTED Final   Bordetella pertussis NOT DETECTED NOT DETECTED Final   Chlamydophila pneumoniae NOT DETECTED NOT DETECTED Final   Mycoplasma pneumoniae NOT DETECTED NOT DETECTED Final    Comment: Performed at [facility], [address], [place], [place] [address]  Culture, respiratory (non-expectorated)     Status: None   Collection Time: 05/17/18  5:45 PM  Result Value Ref Range Status   Specimen Description   Final    TRACHEAL ASPIRATE Performed at [facility], [address], [place], [place] [address]    Special Requests   Final    NONE Performed at [facility], [address], [place], [place] [address]    Gram Stain   Final    RARE WBC PRESENT, PREDOMINANTLY PMN NO ORGANISMS SEEN Performed at [facility], [address] [place], [place] [address]    Culture FEW CANDIDA ALBICANS  Final   Report Status 05/20/2018 FINAL  Final  Culture, blood (Routine X 2) w Reflex to ID Panel     Status: None   Collection Time: 05/19/18 11:06 AM  Result Value Ref Range Status   Specimen Description BLOOD PICC LINE DRAWN BY RN  Final   Special Requests   Final    BOTTLES DRAWN AEROBIC AND ANAEROBIC Blood Culture adequate volume   Culture   Final    NO GROWTH 5 DAYS Performed at [facility], [address] [place], [place] [address]    Report Status 05/24/2018 FINAL  Final  Culture, blood (Routine X 2) w Reflex to ID Panel     Status: None   Collection Time: 05/19/18 11:09 AM  Result Value Ref Range Status   Specimen Description BLOOD LEFT HAND DRAWN BY RN  Final   Special Requests   Final    BOTTLES DRAWN AEROBIC AND ANAEROBIC Blood Culture results may not be optimal due to an excessive volume of blood received in culture bottles   Culture   Final    NO GROWTH 5 DAYS Performed at [facility], [address] [place], [place] [address]    Report Status 05/24/2018 FINAL  Final  Culture, Urine     Status: None   Collection Time: 05/19/18 11:45 AM  Result Value Ref Range Status   Specimen Description   Final    URINE, CATHETERIZED Performed at [facility], [address] [place], [place] [address]    Special Requests   Final    NONE Performed  at [facility], [address], [place], [place] [address]    Culture   Final    NO GROWTH Performed at [facility], [address], [place], [place] [address]    Report Status 05/21/2018 FINAL  Final   Studies/Results: Ct Abdomen Pelvis Wo Contrast  Addendum Date: 05/23/2018   ADDENDUM REPORT: 05/23/2018 20:55 ADDENDUM: As mentioned in the body of the report, but not mentioned in the  impression there is a small amount of pneumomediastinum. This is presumably iatrogenic. No pneumothorax identified at this time. These results will be called to the ordering clinician or representative by the Radiologist Assistant, and communication documented in the PACS or zVision Dashboard. Electronically Signed   By: Trudie Reed M.D.   On: 05/23/2018 20:55   Result Date: 05/23/2018 CLINICAL DATA:  67 year old female with difficulty breathing. Hypertension. Chronic renal failure. Possible pneumonia. EXAM: CT CHEST, ABDOMEN AND PELVIS WITHOUT CONTRAST TECHNIQUE: Multidetector CT imaging of the chest, abdomen and pelvis was performed following the standard protocol without IV contrast. COMPARISON:  Chest CT 05/14/2018. FINDINGS: CT CHEST FINDINGS Cardiovascular: Heart size is normal. There is no significant pericardial fluid, thickening or pericardial calcification. Aortic atherosclerosis. No definite coronary artery calcifications are identified. Right upper extremity PICC with tip terminating in the mid superior vena cava. Mediastinum/Nodes: Small amount of pneumomediastinum predominantly in the anterior mediastinum. No pathologically enlarged mediastinal or hilar lymph nodes. Please note that accurate exclusion of hilar adenopathy is limited on noncontrast CT scans. Small hiatal hernia. Nasogastric tube extending into the stomach. No axillary lymphadenopathy. Lungs/Pleura: Patchy multifocal ground-glass attenuation, septal thickening and thickening of the peribronchovascular interstitium scattered throughout the lungs bilaterally, concerning for severe multilobar pneumonia. Some associated volume loss in the lower lobes of lungs bilaterally. Musculoskeletal: There are no aggressive appearing lytic or blastic lesions noted in the visualized portions of the skeleton. CT ABDOMEN PELVIS FINDINGS Hepatobiliary: Mild diffuse low attenuation throughout the hepatic parenchyma, indicative of hepatic steatosis. No  definite cystic or solid hepatic lesions are confidently identified on today's noncontrast CT examination. Moderate attenuation material in the gallbladder likely to reflect the presence of biliary sludge. No surrounding inflammatory changes to suggest an acute cholecystitis at this time. Pancreas: No definite pancreatic mass or peripancreatic fluid or inflammatory changes are noted on today's noncontrast CT examination. Spleen: Unremarkable. Adrenals/Urinary Tract: 6 mm high attenuation lesion in the lateral aspect of the interpolar region of the right kidney, and 5 mm high attenuation lesion in the posterior aspect of the upper pole the left kidney, both incompletely characterized on today's noncontrast CT examination, but statistically likely a small hemorrhagic/proteinaceous cysts. Bilateral adrenal glands are normal in appearance. No hydroureteronephrosis. Urinary bladder is completely decompressed around an indwelling Foley balloon catheter. Urinary bladder is otherwise unremarkable in appearance. Stomach/Bowel: Nasogastric tube terminating in the antrum of the stomach. Stomach is otherwise unremarkable in appearance. No pathologic dilatation of small bowel or colon. Numerous colonic diverticulae are noted, without definite surrounding inflammatory changes to suggest an acute diverticulitis at this time (assessment is slightly limited by presence of small volume of ascites). The appendix is not confidently identified and may be surgically absent. Regardless, there are no inflammatory changes noted adjacent to the cecum to suggest the presence of an acute appendicitis at this time. Vascular/Lymphatic: Aortic atherosclerosis. No lymphadenopathy noted in the abdomen or pelvis. Reproductive: Uterus and ovaries are unremarkable in appearance. Other: Trace volume of ascites.  No pneumoperitoneum. Musculoskeletal: There are no aggressive appearing lytic or blastic  lesions noted in the visualized portions of the  skeleton. IMPRESSION: 1. The appearance of the lungs is compatible with severe multilobar bronchopneumonia. 2. Trace volume of ascites. 3. No other acute findings noted in the abdomen or pelvis. 4. Hepatic steatosis. 5. Intermediate attenuation material lying dependently in the gallbladder, likely to reflect biliary sludge. No findings to suggests an acute cholecystitis at this time. 6. Aortic atherosclerosis. 7. Additional incidental findings, as above. Electronically Signed: By: Trudie Reedaniel  Entrikin M.D. On: 05/23/2018 20:22   Ct Head Wo Contrast  Result Date: 05/23/2018 CLINICAL DATA:  67 year old female with multifocal pneumonia. Initial encounter. EXAM: CT HEAD WITHOUT CONTRAST TECHNIQUE: Contiguous axial images were obtained from the base of the skull through the vertex without intravenous contrast. COMPARISON:  None. FINDINGS: Brain: Exam is motion degraded. Multiple infarcts frontal lobe, parietal lobe and cerebellum bilaterally of indeterminate age. Possibility of a tiny amount of subarachnoid blood within right frontal sulcus (series 8, image 25 and series 5, image 23) is raised. No obvious intracranial mass although evaluation is significantly limited. Vascular: Evaluation limited Skull: No skull fracture Sinuses/Orbits: No acute orbital abnormality. Air-fluid level right maxillary sinus. Other: Opacification mastoid air cells and middle ear cavities bilaterally. No obstructing lesion of eustachian tube noted. IMPRESSION: 1. Exam is motion degraded. 2. Possibility of a tiny amount of subarachnoid blood within right frontal sulcus (series 8, image 25 and series 5, image 23) is raised. With the degree of motion, it is possible this finding is related to artifact rather than true hemorrhage. 3. Multiple infarcts frontal lobe, parietal lobe and cerebellum bilaterally of indeterminate age. 4. Air-fluid level right maxillary sinus. Opacification mastoid air cells and middle ear cavities bilaterally. These  results were called by telephone at the time of interpretation on 05/23/2018 at 6:44 pm to Jethro PolingElizabeth Murphy, patient's nurse , who verbally acknowledged these results. Request to call physician on call with results. Electronically Signed   By: Lacy DuverneySteven  Olson M.D.   On: 05/23/2018 18:48   Ct Chest Wo Contrast  Addendum Date: 05/23/2018   ADDENDUM REPORT: 05/23/2018 20:55 ADDENDUM: As mentioned in the body of the report, but not mentioned in the impression there is a small amount of pneumomediastinum. This is presumably iatrogenic. No pneumothorax identified at this time. These results will be called to the ordering clinician or representative by the Radiologist Assistant, and communication documented in the PACS or zVision Dashboard. Electronically Signed   By: Trudie Reedaniel  Entrikin M.D.   On: 05/23/2018 20:55   Result Date: 05/23/2018 CLINICAL DATA:  67 year old female with difficulty breathing. Hypertension. Chronic renal failure. Possible pneumonia. EXAM: CT CHEST, ABDOMEN AND PELVIS WITHOUT CONTRAST TECHNIQUE: Multidetector CT imaging of the chest, abdomen and pelvis was performed following the standard protocol without IV contrast. COMPARISON:  Chest CT 05/14/2018. FINDINGS: CT CHEST FINDINGS Cardiovascular: Heart size is normal. There is no significant pericardial fluid, thickening or pericardial calcification. Aortic atherosclerosis. No definite coronary artery calcifications are identified. Right upper extremity PICC with tip terminating in the mid superior vena cava. Mediastinum/Nodes: Small amount of pneumomediastinum predominantly in the anterior mediastinum. No pathologically enlarged mediastinal or hilar lymph nodes. Please note that accurate exclusion of hilar adenopathy is limited on noncontrast CT scans. Small hiatal hernia. Nasogastric tube extending into the stomach. No axillary lymphadenopathy. Lungs/Pleura: Patchy multifocal ground-glass attenuation, septal thickening and thickening of the  peribronchovascular interstitium scattered throughout the lungs bilaterally, concerning for severe multilobar pneumonia. Some associated volume loss in the lower lobes of lungs bilaterally. Musculoskeletal: There  are no aggressive appearing lytic or blastic lesions noted in the visualized portions of the skeleton. CT ABDOMEN PELVIS FINDINGS Hepatobiliary: Mild diffuse low attenuation throughout the hepatic parenchyma, indicative of hepatic steatosis. No definite cystic or solid hepatic lesions are confidently identified on today's noncontrast CT examination. Moderate attenuation material in the gallbladder likely to reflect the presence of biliary sludge. No surrounding inflammatory changes to suggest an acute cholecystitis at this time. Pancreas: No definite pancreatic mass or peripancreatic fluid or inflammatory changes are noted on today's noncontrast CT examination. Spleen: Unremarkable. Adrenals/Urinary Tract: 6 mm high attenuation lesion in the lateral aspect of the interpolar region of the right kidney, and 5 mm high attenuation lesion in the posterior aspect of the upper pole the left kidney, both incompletely characterized on today's noncontrast CT examination, but statistically likely a small hemorrhagic/proteinaceous cysts. Bilateral adrenal glands are normal in appearance. No hydroureteronephrosis. Urinary bladder is completely decompressed around an indwelling Foley balloon catheter. Urinary bladder is otherwise unremarkable in appearance. Stomach/Bowel: Nasogastric tube terminating in the antrum of the stomach. Stomach is otherwise unremarkable in appearance. No pathologic dilatation of small bowel or colon. Numerous colonic diverticulae are noted, without definite surrounding inflammatory changes to suggest an acute diverticulitis at this time (assessment is slightly limited by presence of small volume of ascites). The appendix is not confidently identified and may be surgically absent. Regardless,  there are no inflammatory changes noted adjacent to the cecum to suggest the presence of an acute appendicitis at this time. Vascular/Lymphatic: Aortic atherosclerosis. No lymphadenopathy noted in the abdomen or pelvis. Reproductive: Uterus and ovaries are unremarkable in appearance. Other: Trace volume of ascites.  No pneumoperitoneum. Musculoskeletal: There are no aggressive appearing lytic or blastic lesions noted in the visualized portions of the skeleton. IMPRESSION: 1. The appearance of the lungs is compatible with severe multilobar bronchopneumonia. 2. Trace volume of ascites. 3. No other acute findings noted in the abdomen or pelvis. 4. Hepatic steatosis. 5. Intermediate attenuation material lying dependently in the gallbladder, likely to reflect biliary sludge. No findings to suggests an acute cholecystitis at this time. 6. Aortic atherosclerosis. 7. Additional incidental findings, as above. Electronically Signed: By: Trudie Reed M.D. On: 05/23/2018 20:22   US Renal  Result Date: 05/22/2018 CLINICAL DATA:  67 year old female with acute on chronic renal failure. Initial encounter. EXAM: RENAL / URINARY TRACT ULTRASOUND COMPLETE COMPARISON:  None. FINDINGS: Right Kidney: Renal measurements: 8.3 x 3.3 x 3.9 cm = volume: 54.8 mL. Echogenicity within normal limits. Right upper pole 1.1 cm cyst suspected. No hydronephrosis. Small amount of perinephric fluid lower pole region. Left Kidney: Renal measurements: 7.9 x 3.9 x 4.1 cm = volume: 67.3 mL. Echogenicity within normal limits. No left renal mass noted. Small amount of perinephric fluid lower pole region. Bladder: Decompressed by Foley catheter not visualized. Liver of increased echogenicity consistent with fatty infiltration and/or hepatocellular disease. IMPRESSION: 1. No hydronephrosis. 2. Small amount of perinephric fluid lower pole region bilaterally. Etiology indeterminate. 3. Right upper pole 1.1 cm cyst suspected. 4. Bladder not visualized as  decompressed with Foley catheter. 5. Liver of increased echogenicity consistent with fatty infiltration and/or hepatocellular disease. Electronically Signed   By: Lacy Duverney M.D.   On: 05/22/2018 14:04   Dg Chest Port 1 View  Result Date: 05/23/2018 CLINICAL DATA:  NG tube placement. EXAM: PORTABLE CHEST 1 VIEW COMPARISON:  05/23/2018 FINDINGS: NG tube noted with tip over the stomach. PICC line noted with tip over superior vena cava. Heart size  normal. Diffuse bilateral pulmonary interstitial prominence. Interstitial edema/pneumonitis could present this fashion. Tiny bilateral pleural effusions cannot be excluded. No pneumothorax. IMPRESSION: 1. NG tube noted with tip in the stomach. PICC line noted with tip over superior vena cava. 2. Bilateral interstitial prominence. Edema/pneumonitis could present this fashion. Small bilateral pleural effusions cannot be excluded. Electronically Signed   By: Maisie Fus  Register   On: 05/23/2018 15:00   Dg Chest Port 1 View  Result Date: 05/23/2018 CLINICAL DATA:  Respiratory failure EXAM: PORTABLE CHEST 1 VIEW COMPARISON:  05/22/2018 FINDINGS: Cardiac shadows within normal limits for portable technique. Right-sided PICC line, endotracheal tube and nasogastric catheter are noted. Persistent atelectasis/infiltrate is noted in the right lung base. The overall inspiratory effort has decreased when compare with the prior exam. Some patient rotation accentuates the mediastinal markings although no other focal infiltrate is seen. No bony abnormality is noted. IMPRESSION: Persistent opacity in the right lung base. Tubes and lines as described. Electronically Signed   By: Alcide Clever M.D.   On: 05/23/2018 07:37   Dg Chest Port 1 View  Result Date: 05/22/2018 CLINICAL DATA:  Respiratory failure EXAM: PORTABLE CHEST 1 VIEW COMPARISON:  05/21/2018 FINDINGS: Cardiac shadow is stable. Endotracheal tube and nasogastric catheter is well as a right-sided PICC line are again seen  in stable position. The lungs are well aerated bilaterally. Improved aeration in the left lung base and right upper lobe is noted. Persistent airspace opacity in the right lung base is seen. No sizable effusion is noted. IMPRESSION: Overall improved aeration with some persistent opacity in the right base. Electronically Signed   By: Alcide Clever M.D.   On: 05/22/2018 09:52    Medications:  Prior to Admission:  Medications Prior to Admission  Medication Sig Dispense Refill Last Dose  . ALPRAZolam (XANAX) 0.5 MG tablet Take 0.5 mg by mouth daily as needed.    Past Month at Unknown time  . aspirin 81 MG tablet Take 81 mg by mouth every other day.    Past Week at Unknown time  . cetirizine (ZYRTEC ALLERGY) 10 MG tablet Take 10 mg by mouth daily.   Past Week at Unknown time  . doxycycline (VIBRA-TABS) 100 MG tablet Take 100 mg by mouth 2 (two) times daily.   05/10/2018 at 1800  . levothyroxine (SYNTHROID, LEVOTHROID) 50 MCG tablet Take 50 mcg by mouth as directed. Patient takes on Saturday and Sunday; all other days she takes   05/07/2018 at 1000  . levothyroxine (SYNTHROID, LEVOTHROID) 75 MCG tablet Take 75 mcg by mouth as directed. Patient takes every day except Saturday and Sunday; on Saturday and Sunday, she takes   04/29/2018 at 1000  . lisinopril (PRINIVIL,ZESTRIL) 20 MG tablet TAKE 1/2 TABLET BY MOUTH ONCE DAILY. 45 tablet 3 05/10/2018 at 1800  . pravastatin (PRAVACHOL) 40 MG tablet TAKE ONE TABLET BY MOUTH IN THE EVENING. 90 tablet 2 05/10/2018 at 1800  . Pseudoephedrine-Guaifenesin (MUCINEX D MAX STRENGTH) [phone] MG TB12 Take 1 tablet by mouth daily.   05/10/2018 at Unknown time  . verapamil (VERELAN PM) 240 MG 24 hr capsule TAKE (1) CAPSULE BY MOUTH AT BEDTIME. 30 capsule 2 05/10/2018 at 1800  . zolpidem (AMBIEN) 10 MG tablet Take 10 mg by mouth at bedtime.    05/10/2018 at Unknown time   Scheduled: . budesonide (PULMICORT) nebulizer solution  0.5 mg Nebulization  BID  . Chlorhexidine Gluconate Cloth  6 each Topical Daily  . diltiazem  60 mg  Oral Q6H  . docusate  100 mg Per Tube BID  . feeding supplement (PRO-STAT SUGAR FREE 64)  30 mL Per Tube Daily  . insulin aspart  2-6 Units Subcutaneous Q4H  . ipratropium  0.5 mg Nebulization Q6H  . levalbuterol  1.25 mg Nebulization Q6H  . levothyroxine  37.5 mcg Intravenous Daily  . mouth rinse  15 mL Mouth Rinse BID  . methylPREDNISolone (SOLU-MEDROL) injection  60 mg Intravenous Q12H  . metoprolol tartrate  2.5 mg Intravenous Q6H  . pantoprazole (PROTONIX) IV  40 mg Intravenous Q24H  . sodium chloride flush  10-40 mL Intracatheter Q12H  . sodium chloride flush  3 mL Intravenous Q12H  . sodium zirconium cyclosilicate  10 g Oral BID   Continuous: . sodium chloride 135 mL/hr at 05/24/18 0744  . sodium chloride Stopped (05/21/18 1100)   ZOX:WRUEAV chloride, acetaminophen **OR** acetaminophen, albuterol, bisacodyl, ondansetron **OR** ondansetron (ZOFRAN) IV, sodium chloride flush  Assesment: She was admitted with acute hypoxic respiratory failure likely related to pneumonia presumably from aspiration.  She ended up being intubated and placed on mechanical ventilation.  She had some altered mental status before that happened and may have had a stroke at that point but we could not really evaluate it because of her respiratory distress and then sedation for ventilator support.  She has been able to come off of the ventilator but is still somewhat hypoxic.  Her white blood count is gone up to 55,000 and she is off antibiotics.  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  She has what looks like sinus disease on CT.  Discussed the situation with her husband and nephew and their initial thought is to not put her back on the ventilator and transition to more comfort care.  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

## 2018-05-24 NOTE — Progress Notes (Signed)
Nutrition Brief Follow-up Note  RD planned to see pt regarding change in status (extubated) and for tube feedings via NGT. Was also due for weekly f/u  Palliative NP had just finished speaking with family. SHe notes the family has newly decided on full comfort care. She reports orders will be placed for removal of NGT and for nutrition as desires.   No follow up warranted this date. Clinical nutrition to sign off.  If nutrition issues arise, please consult RD.   Christophe LouisNathan Franks RD, LDN, CNSC Clinical Nutrition Available Tues-Sat via Pager: [phone] 05/24/2018 11:55 AM

## 2018-05-24 NOTE — Progress Notes (Signed)
Palliative:   [name], "Jasmine Black", is lying quietly in bed.  She will look in my general direction, briefly make eye contact.  She appears acutely/critically ill.  She is having some work of breathing, 3+ edema and hands.  Present today at bedside are husband Jasmine Black, Jasmine Black, son Jasmine Black, daughter-in-law Jasmine Black (son Ronald's wife).   Family shares that until this illness, approximately 2 months ago, she had been living a full life, caring for grandchildren maintaining her house independent with IADLs.  We talked in detail about her acute illness.  We talk in detail about her brain imaging, her lung function, work of breathing noted and 15 L via high flow with saturations around 93 to 94%, her acute kidney injury, her very low albumin/poor nutritional state, swelling and anasarca.  Family is in agreement that Jasmine Black would not want to live in a nursing home, it is important to her to have quality of life, be active.  We talked about the options for continued treatment for a few days to see her body, modern medicine, God's will.  We also talked in detail about what comfort measures would look like.  We talked about prognosis with permission.  I share that even with full scope treatment I would not be surprised if she only had months.  I share that I would not be surprised if she had 5 to 14 days with comfort measures only.  Chaplain Jasmine Black is present during our meeting to provide psychosocial and spiritual support.  Family is encouraged to take time to talk amongst themselves, pray about what is right for Jasmine Black.  Daughter-in-law Jasmine Black shares her experience with in-home and residential hospice.  Conference with hospitalist related to goals of care discussion, family being given time to process, decide.  I return later with chaplain Jasmine Black.  We continue discussions related to what comfort measures would look like in detail.  Family is in agreement for COMFORT CARE only.    We have discussed  residential hospice if she stabilizes.   Conference with nursing staff related to goals of care discussion, comfort measures only at this time, symptom management, patient needs. Contact with hospitalist advised of family's decision for comfort measures only.  110 minutes, extended time  Jasmine Carmelasha Kendal Ghazarian, NP Palliative Medicine Team Team Phone # [phone]  Greater than 50% of this time was spent counseling and coordinating care related to the above assessment and plan.

## 2018-05-28 NOTE — Progress Notes (Signed)
RN called Kellie Simmeringonald Jelinek pts husband- made aware of change is pts status. Husband stated he was coming up here and would call other family members.

## 2018-05-28 NOTE — Progress Notes (Signed)
[place] donor services reference number [id number] spoke with Marica OtterSissy Kilby.

## 2018-05-28 NOTE — Progress Notes (Signed)
Patient time of death [id number] this morning. [place] donor [facility] notified a canidate, nursing supervisor notified. attending notified.

## 2018-05-28 NOTE — Discharge Summary (Signed)
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 She had gone to her PCP a few days prior for a week of fevers, chills, increasing cough, and diagnosed with pneumonia, started on doxycycline and nebulized breathing treatments at home but has had no improvement. Over the past 24 hours she's grown increasingly weak, getting up out of bed rarely, much more short of breath when she attempts this and constantly feels like she's about to pass out. This is improved with rest. No chest pain or palpitations or true syncope. Hasn't missed doses of abx or had abd pain, N/V/D. She's been taking mucinex every 4 hours, even though it's supposed to be every 12 hours.   ED Course: Afebrile, but was 100.56F at home prior to taking tylenol. Tachycardic with soft BP, hypoxic to 88% on room air, improved with 2L O2, tachypneic. WBC 11.3k, flu negative, lactic acid elevated. Levaquin and IV fluids given after blood cultures drawn.   Hospital  Course:  As per intubation patient's symptoms continue progressing to the point that she requires to be initially placed on BiPAP around May 15, 2018 and transferred to stepdown/ICU.  Patient respiratory status continued to decline and the patient was intubated on 05/17/2018.  On 11/26 she was extubated but remain unresponsive, unable to eat and is still requiring high flow nasal cannula supplementation.  She was afebrile and has completed antibiotic therapy.  WBCs remain to be elevated in the 55,000 range.  Work-up at this time included a CT scan of her head that demonstrated multiple strokes affecting frontal lobe, parietal lobe and cerebellum bilaterally.  Palliative care was consulted and after discussing with family members the decision was made not to pursued any further invasive evaluation or treatment; to avoid intubation and to just focus on comfort care and symptomatic management.  All medications were discontinued, NG tube that she was using for artificial nutrition and route for medications was discontinue and the patient was initiated on comfort care management using as needed morphine and Ativan.  Around 5:51 in the morning patient expired.   Time: 25 minutes  Signed:  Vassie LollCarlos Zay Black  [facility] 05/05/2018, 9:09 AM

## 2018-05-28 DEATH — deceased

## 2018-05-31 ENCOUNTER — Ambulatory Visit: Payer: BC Managed Care – PPO | Admitting: Cardiovascular Disease

## 2019-08-03 IMAGING — CR DG CHEST 1V PORT
1 series · 1 of 1 positions shown · non-contrast
Comparison: Portable chest x-ray of 05/17/2017

CLINICAL DATA: Evaluate endotracheal tube placement

EXAM:
PORTABLE CHEST 1 VIEW

[portable]
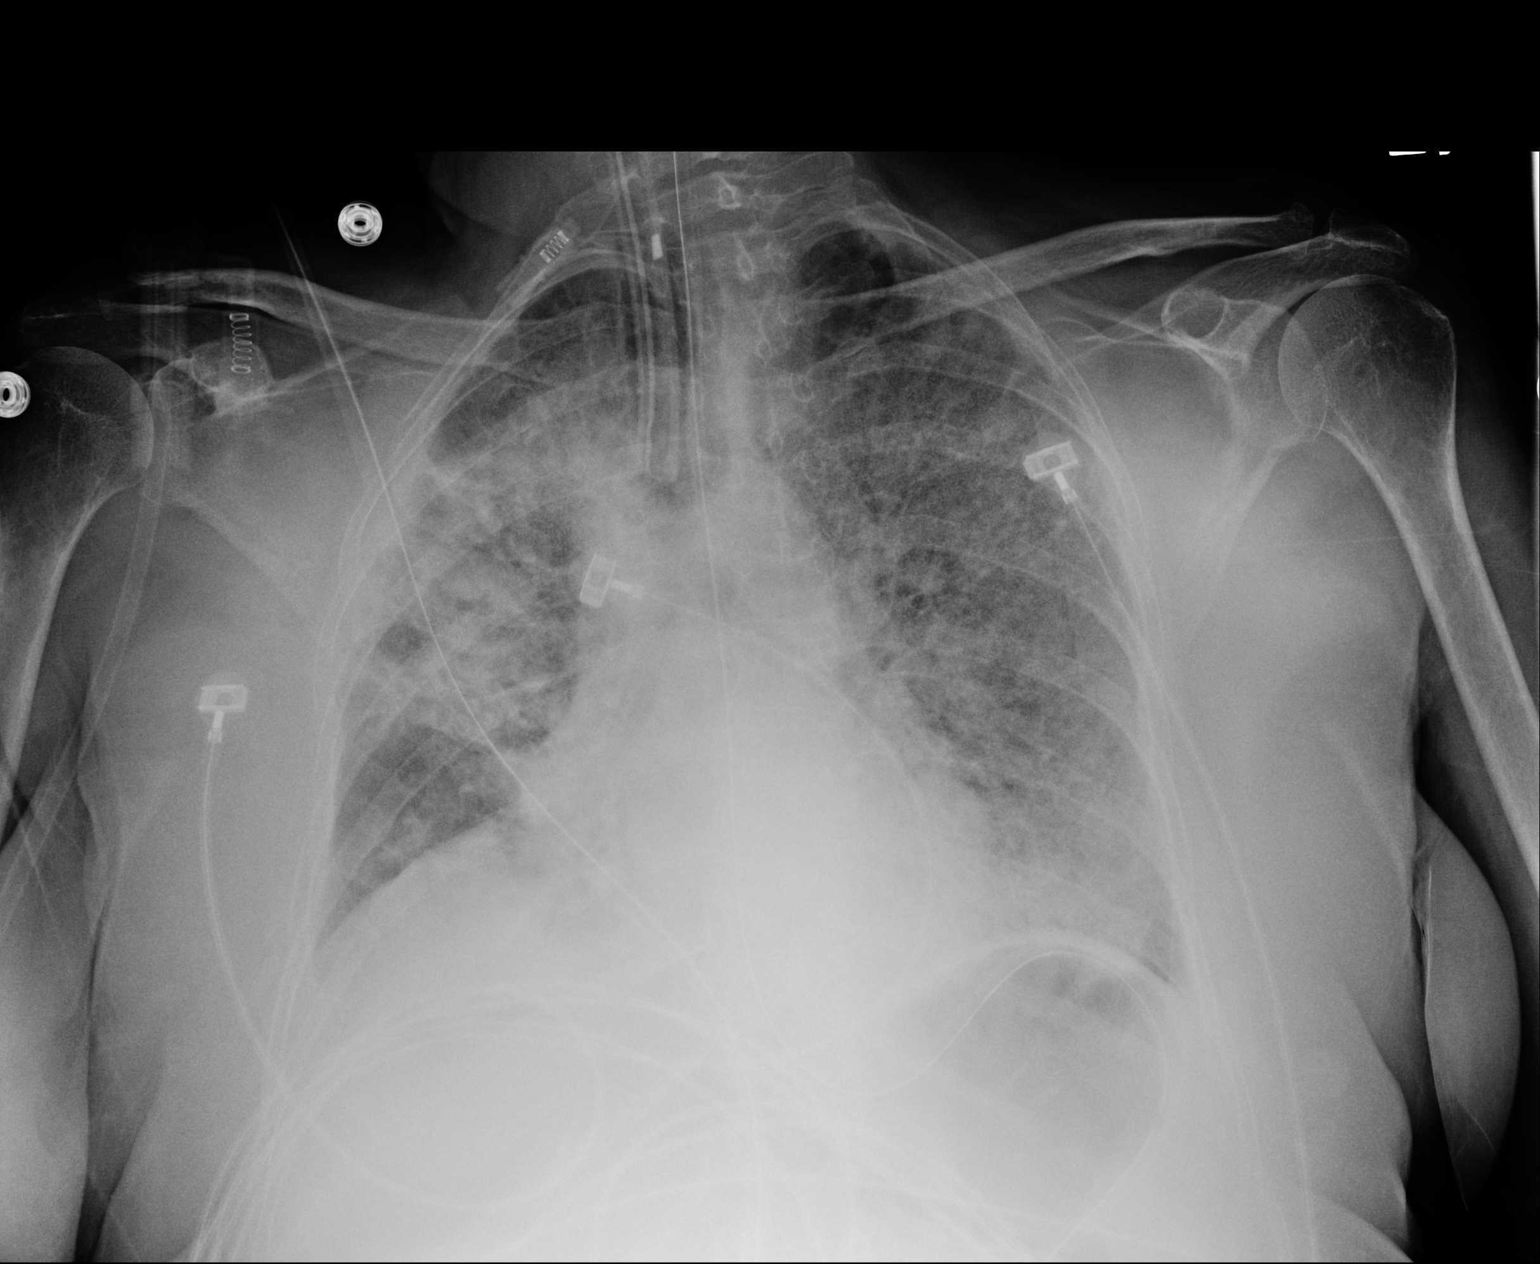

[1 of 1 positions shown; findings below may reference images not displayed]

FINDINGS: Endotracheal tube has now been placed with the tip approximately
cm above the carina. There is little change in patchy lung opacities
greatest in the right mid and upper lung which appears most typical
of pneumonia. However as noted on the prior dictation, superimposed
edema cannot be excluded. Mild cardiomegaly is stable.
IMPRESSION: 1. Tip of endotracheal tube 2.7 cm above the carina.
2. Little change in lung opacities as noted previously suspicious
for pneumonia and possibly superimposed edema.

## 2019-08-05 IMAGING — CR DG CHEST 1V PORT
1 series · 2 of 2 positions shown · non-contrast
Comparison: Radiograph May 18, 2018.

CLINICAL DATA: Nasogastric tube placement.

EXAM:
PORTABLE CHEST 1 VIEW

[Series 1: portable · 0.17mm/px · 2 of 2 slices shown]
[im 1/2]
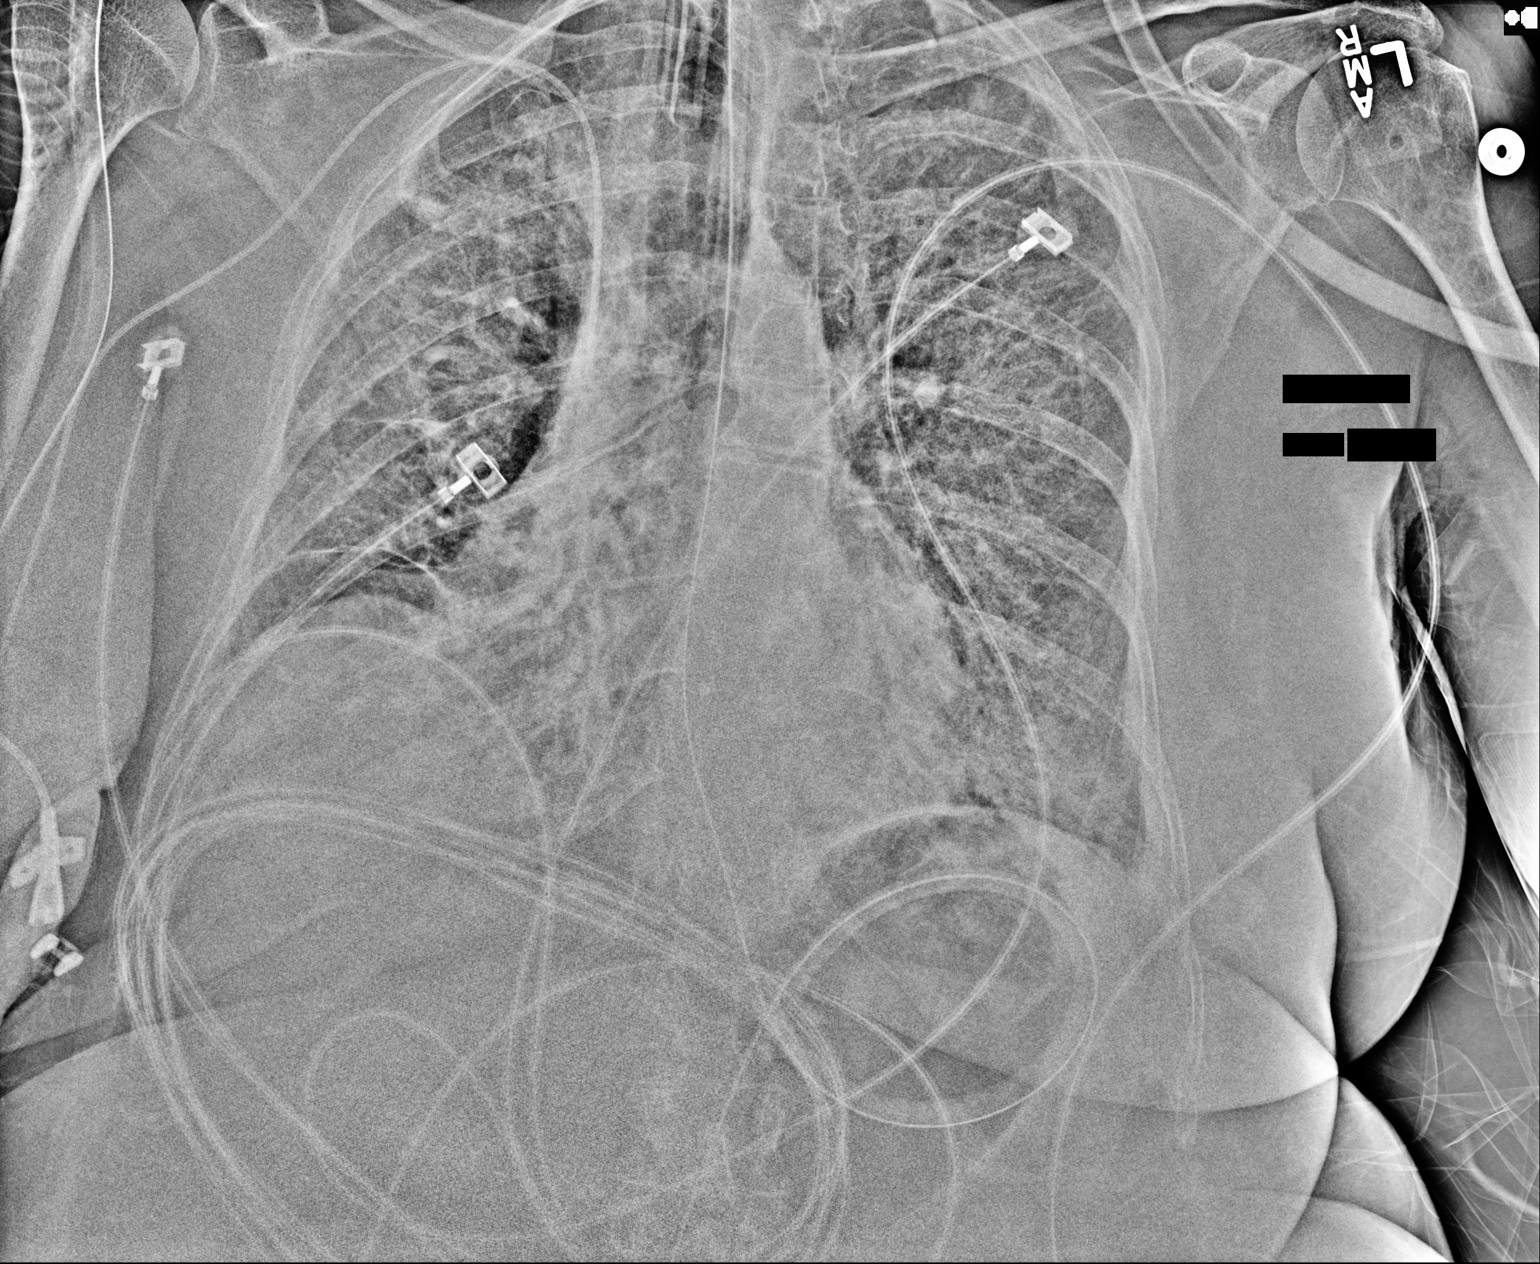
[im 2/2]
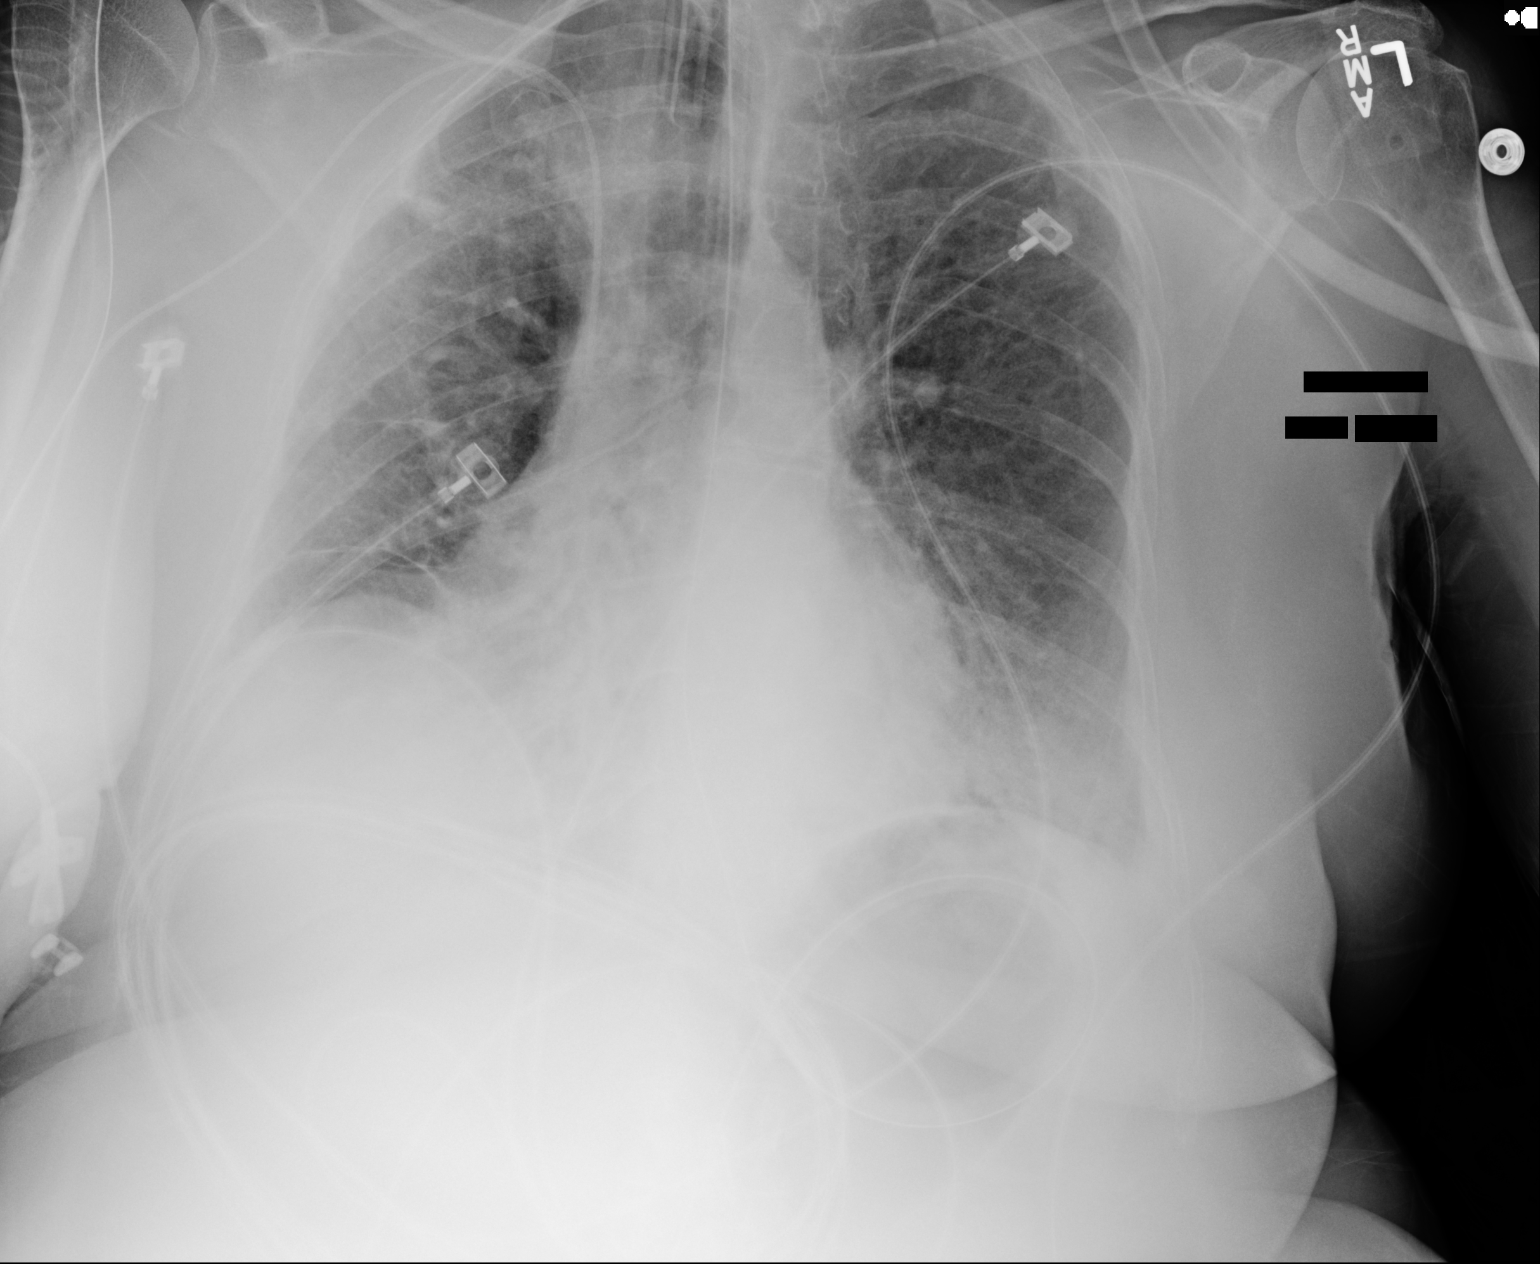

[2 of 2 positions shown; findings below may reference images not displayed]

FINDINGS: Stable cardiomediastinal silhouette. Endotracheal and nasogastric
tubes are unchanged in position. Right-sided PICC line is unchanged
in position. No pneumothorax is noted. Mildly improved bilateral
lung opacities are noted most consistent with improving edema. Small
pleural effusions may be present. Bony thorax is unremarkable.
IMPRESSION: Stable support apparatus. Probable mildly improved bilateral
pulmonary edema.

## 2019-08-07 IMAGING — CR DG CHEST 1V PORT
1 series · 1 of 1 positions shown · non-contrast
Comparison: 05/20/2018

CLINICAL DATA: Respiratory failure, ventilatory support

EXAM:
PORTABLE CHEST 1 VIEW

[portable]
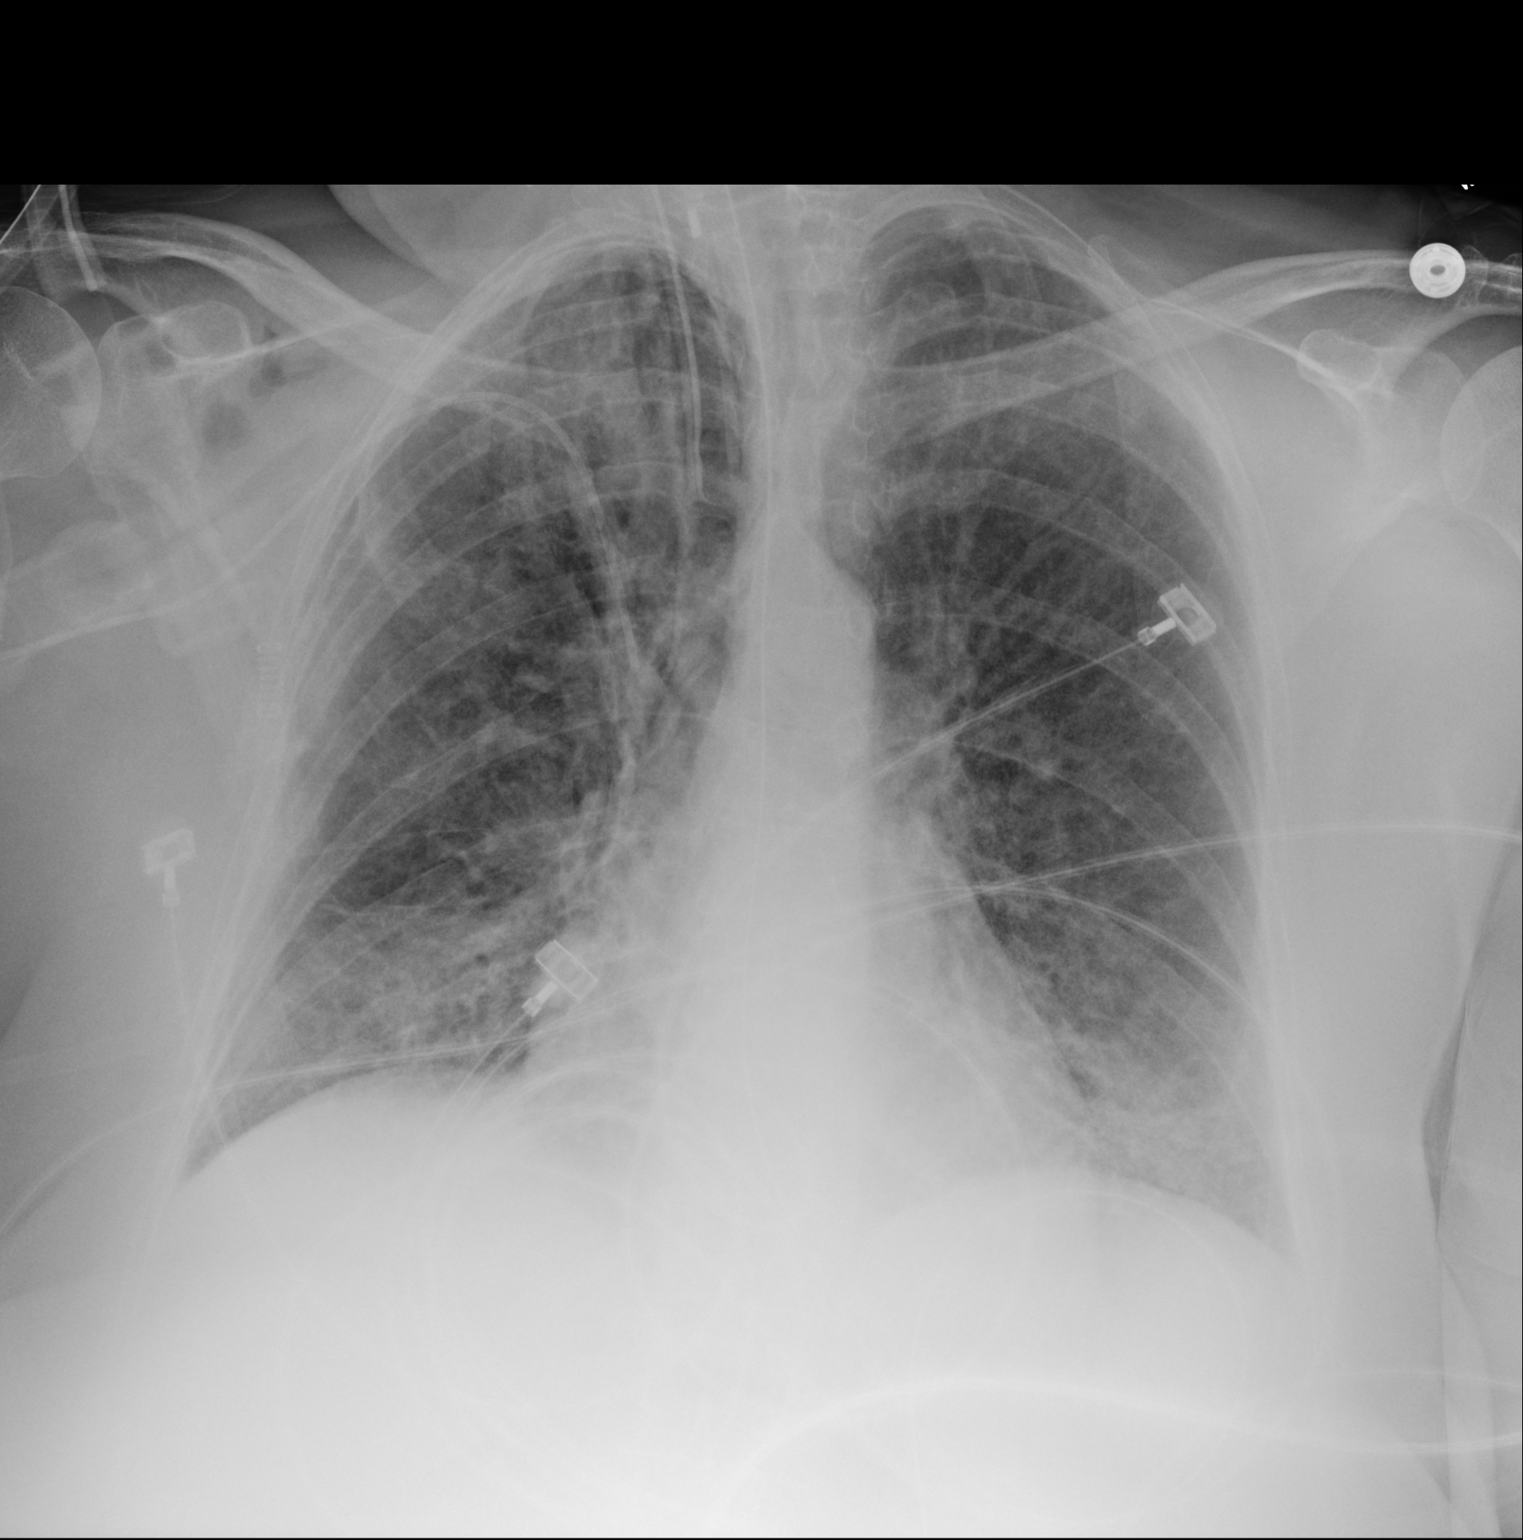

[1 of 1 positions shown; findings below may reference images not displayed]

FINDINGS: Endotracheal tube 4.4 cm above the carina. Exam is rotated to the
right. Right PICC line tip upper SVC level. NG tube enters the
proximal stomach.

Minimal improvement in asymmetric patchy bilateral airspace
disease/pneumonia involving the right upper lobe and both lower
lobes. No enlarging effusion by plain radiography. No large
pneumothorax. Overall stable exam compared to yesterday.
IMPRESSION: Stable support apparatus

Persistent bilateral patchy airspace process, minimal improvement,
more compatible with multifocal pneumonia by comparison CT

## 2019-08-09 IMAGING — CR DG CHEST 1V PORT
2 series · 2 of 2 positions shown · non-contrast
Comparison: 05/23/2018

CLINICAL DATA: NG tube placement.

EXAM:
PORTABLE CHEST 1 VIEW

[portable (1 of 2)]
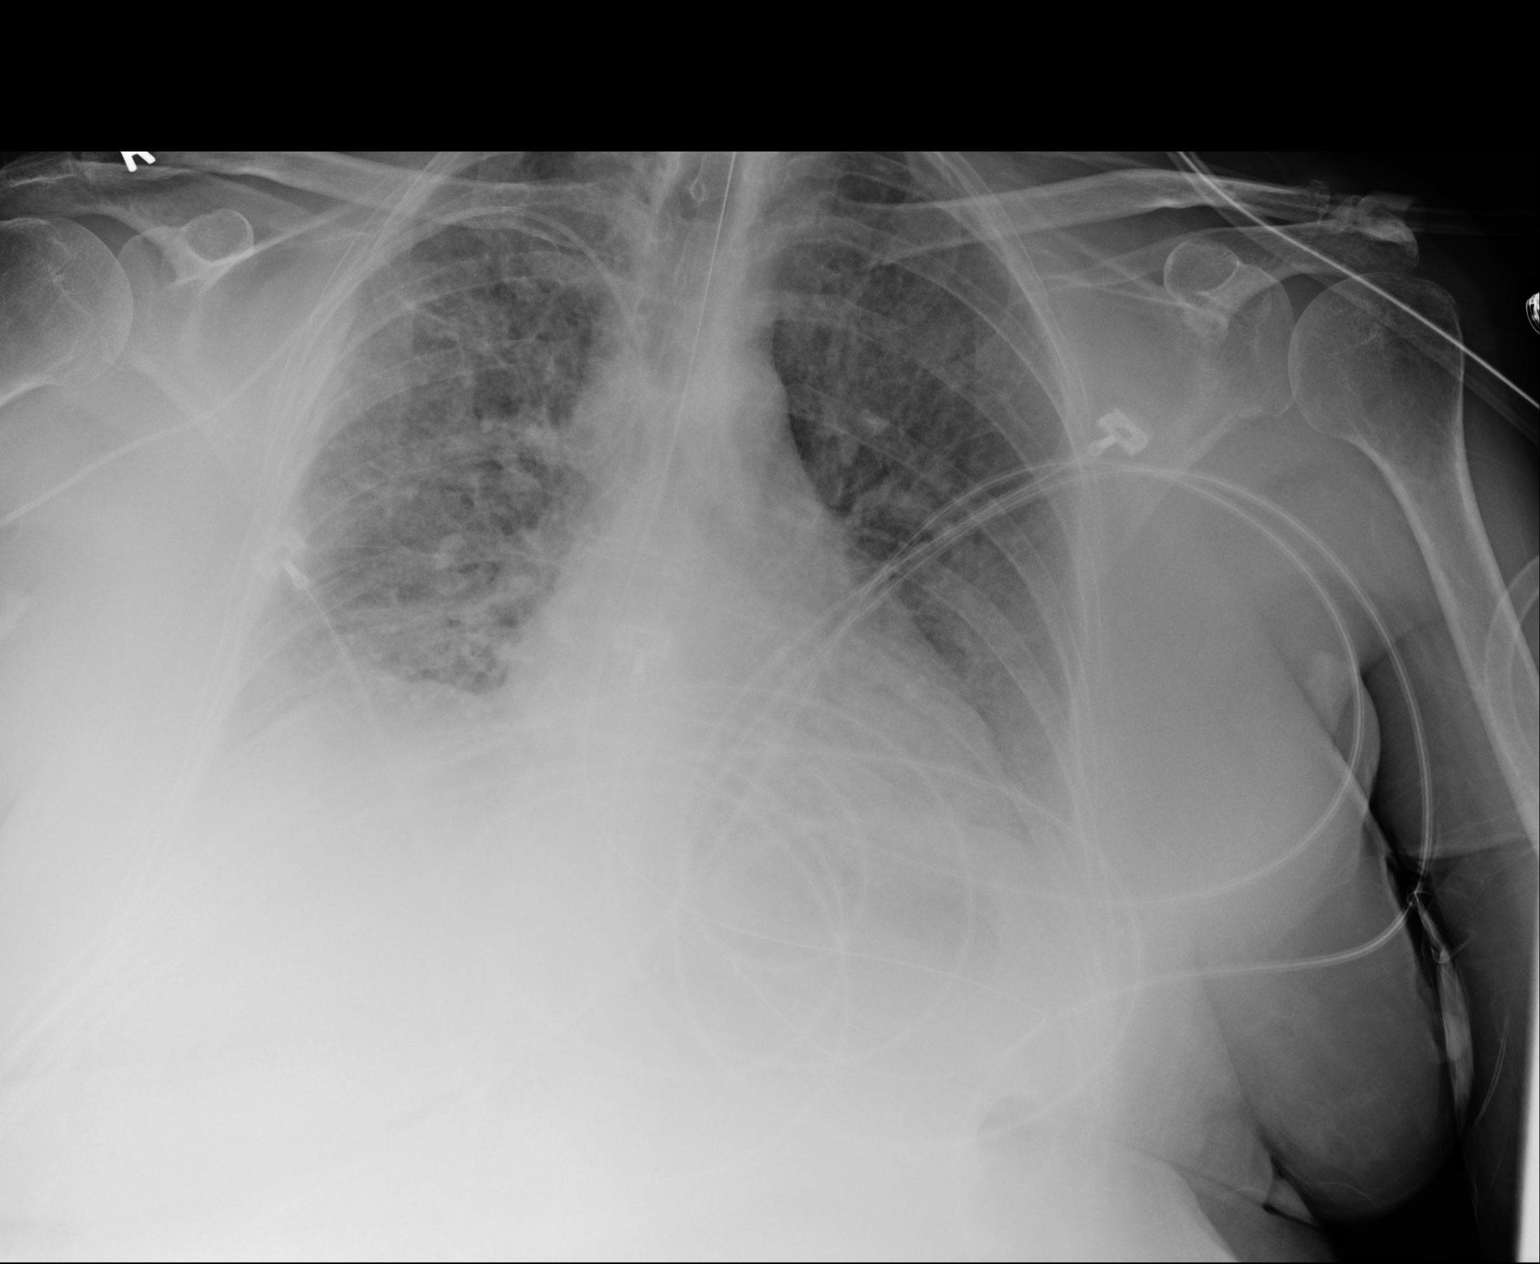

[portable (2 of 2)]
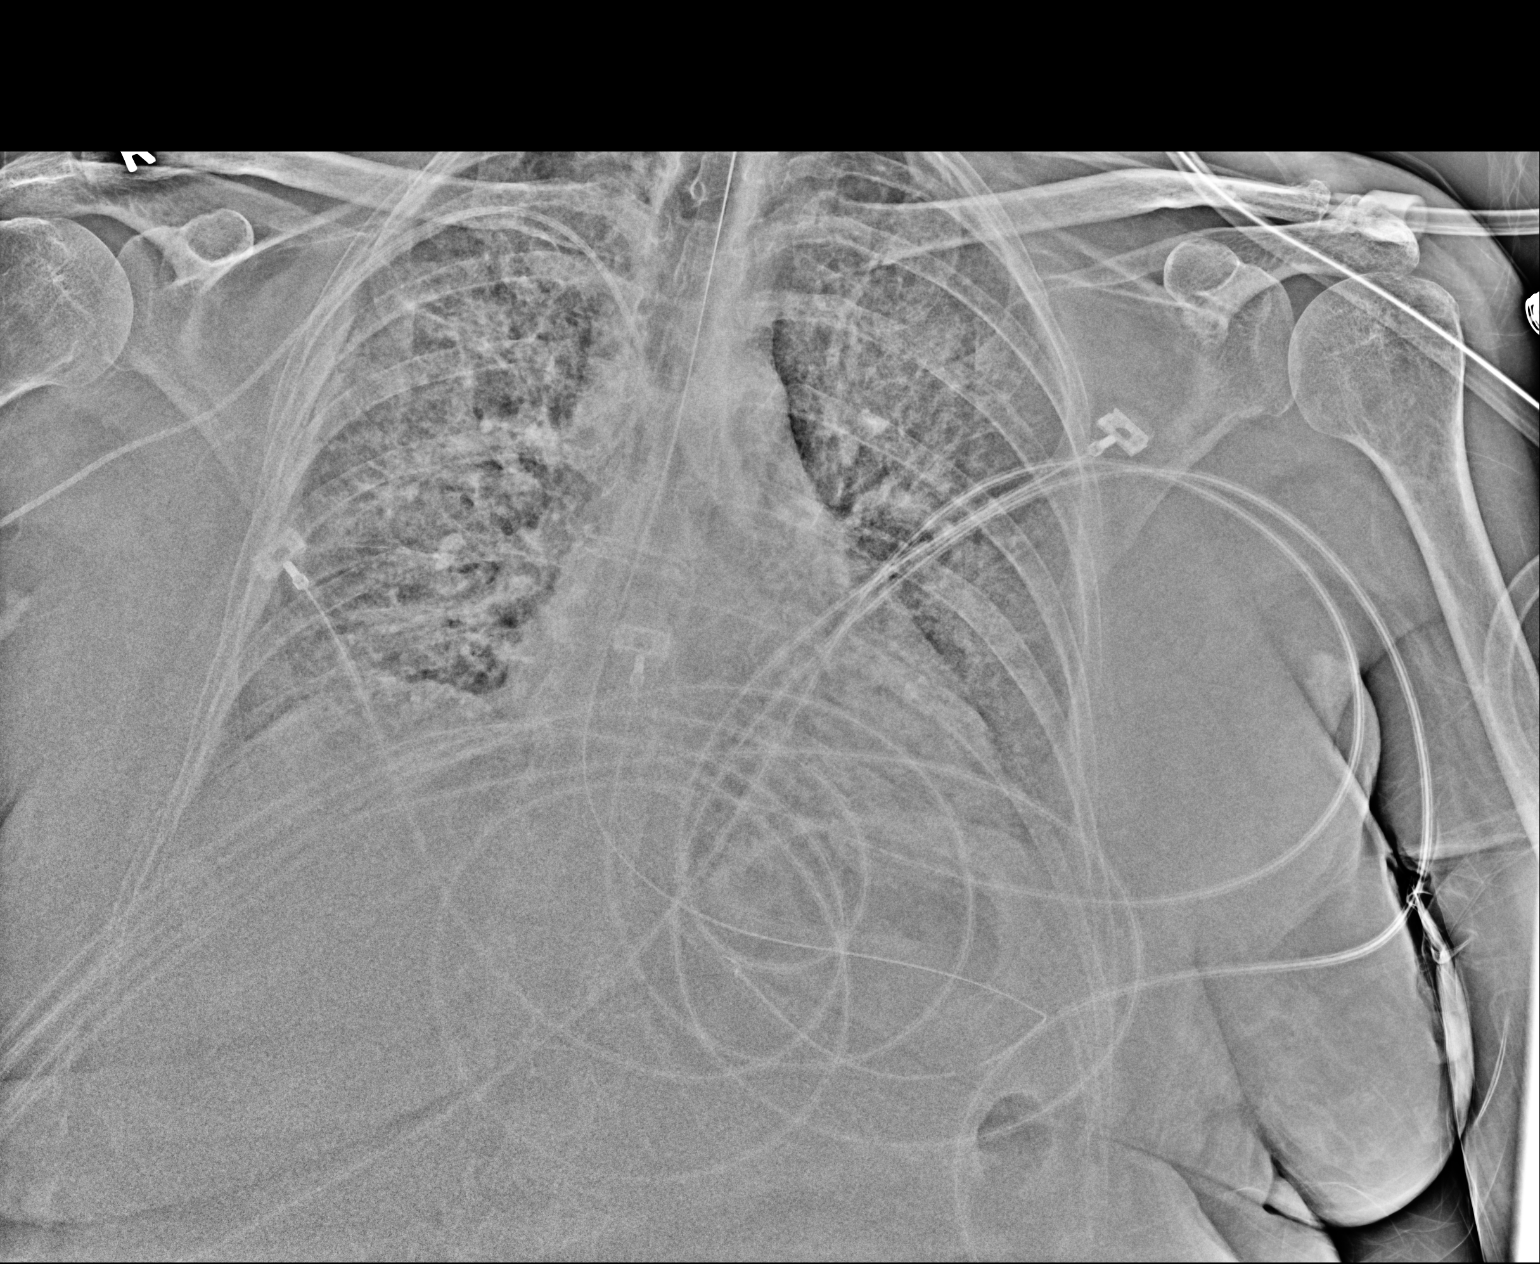

[2 of 2 positions shown; findings below may reference images not displayed]

FINDINGS: NG tube noted with tip over the stomach. PICC line noted with tip
over superior vena cava. Heart size normal. Diffuse bilateral
pulmonary interstitial prominence. Interstitial edema/pneumonitis
could present this fashion. Tiny bilateral pleural effusions cannot
be excluded. No pneumothorax.
IMPRESSION: 1. NG tube noted with tip in the stomach. PICC line noted with tip
over superior vena cava.

2. Bilateral interstitial prominence. Edema/pneumonitis could
present this fashion. Small bilateral pleural effusions cannot be
excluded.
